# Patient Record
Sex: Female | Born: 1940 | Race: White | Hispanic: No | Marital: Married | Smoking: Never smoker
Health system: Southern US, Community
[De-identification: ages and names within clinical notes are randomized; demographics above are authoritative.]

## PROBLEM LIST (undated history)

## (undated) DIAGNOSIS — I10 Essential (primary) hypertension: Secondary | ICD-10-CM

## (undated) DIAGNOSIS — G35 Multiple sclerosis: Secondary | ICD-10-CM

## (undated) HISTORY — PX: CHOLECYSTECTOMY: SHX55

## (undated) HISTORY — DX: Essential (primary) hypertension: I10

## (undated) HISTORY — PX: ABDOMINAL HYSTERECTOMY: SHX81

## (undated) HISTORY — PX: OTHER SURGICAL HISTORY: SHX169

## (undated) HISTORY — PX: APPENDECTOMY: SHX54

## (undated) NOTE — Initial Assessments (Signed)
 Formatting of this note might be different from the original. Living Arrangements Lives at home? yes, spouse/significant other  Abuse questions Do you feel safe in your current living situation? yes Have  you been hit, hurt, or harmed by someone close to you? no Criteria suggestive of abuse: none  Functional Status Deficits related to this hospitalization: none  Nutritional Status Please list any identified nutritional problems: none  Continence Do you leak urine when you do not want to: Yes;satisfied with current treatment  Do you leak stool when you do not want to: Yes; satisfied with current treatment   Advance Directives Do you have an advance directive? yes: Agent: GARREL SHOVE, Contact phone number: [phone], Copy in chart: NO. and RN note patient has an advance directive in dear doctor section.   Electronically signed by Legrand Garre (R.N.) at 06/12/2008  1:35 PM PDT

## (undated) NOTE — Progress Notes (Signed)
 Formatting of this note might be different from the original. 1430 NOTED WITH MULTIPLE BRUISE  ON LEFT AND RIGHT ARM,ACCORDING TO PATIENT SHE HAD THAT WHENSHE HAD HER INFUSION LAST Tuesday, HAD ANOTHER AFTER IV INSERTION ,ICE PACK WAS APPLIED ON LEFT ARM,PATIENT COMFORTABLE,NO OTHER COMPLAINTS. Electronically signed by Altamirano, Susan N (R.N.) at 04/18/2008  3:10 PM PST

## (undated) NOTE — H&P (Signed)
 Formatting of this note might be different from the original. . PAIN MANAGEMENT  .  REFERRED BY  01/25/2008 16:09:00 Kelly Cole (Family Medicine) C - 43 y/o female with multiple sclerosis and problems with spasticity and intermittent burning pain in Cole arm and r leg requests referral to pain management; pleas eval  .  CHIEF COMPLAINT  Pain everywhere, lower back, butt shoulders  .  HISTORY OF THE PRESENT ILLNESS  Kelly Cole is a 27 year old female who reports since years and years and years pain everywhere, lower back, butt, shoulders. The sensations are burning, shooting, tremendous spasticity.  .  The pain ranges between 3/10 and 10/10.  .  The pt has had prolotherapy which did not change her symptoms. The pt has tried acupuncture which lightened it slightly.  .  The patient has not had any cervical or lumbar epidural steroid injections in the past.  .  MEDICATIONS (CURRENT)  .  PROVIGIL 200 MG  RESTASIS 0.05 % OPHT DROPPERETTE  CITALOPRAM 40 MG  ALENDRONATE  LISINOPRIL   AMLODIPINE   AVONEX  OXYBUTYNIN  OMEPRAZOLE  .  PAST PAIN MEDS D/C'D:  GABAPENTIN 300 MG (diarrhea), Tegretol (made gait bad), baclofen  (was on 20 years, can't remember its effects), Vicodin (used once or twice only)  .  ALLERGIES  Meperidine Hcl Asthma and/or Shortness of Breath  Tizanidine Hcl Nausea and/or Vomiting  .  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  PAST SURGICAL HISTORY  Appendectomy, cholecystectomy, oophorectomy, hysterectomy  .  SOCIAL HISTORY  +EtOH 2-3/night, Denies Tob/Rec Drugs  .  REVIEW OF SYSTEMS  +bladder or bowel incontinence x 20 years, denies melena  .  PHYSICAL EXAMINATION  BP 140/63   Pulse 65  Temp 95 F (35 C)  Resp 11  Ht 1.651 m (5' 5)  Wt 61.236 kg (135 lb)  SpO2 95%  LMP Hysterectomy, cervix removed . GENERAL  Pt is a well developed, well nourished in NAD  .  DIAGNOSTIC STUDIES  .  MRI OF THE LUMBAR SPINE WITHOUT CONTRAST: 03/21/08  CLINICAL HISTORY: Multiple sclerosis, low back pain.  TECHNIQUE: Axial and sagittal, T1 and T2 weighted images were  obtained.  COMPARISON: Correlation is made with CT scan of the abdomen and  pelvis from 05/10/05.  FINDINGS: Sagittal views show degenerative changes throughout the  lumbar spine. There is no evidence of spondylolisthesis. There is  a mild degree of motion artifact. The conus medullaris is  unremarkable although the distal spinal cord is not optimally  visualized.  The level of T12-L1 appears unremarkable.  At the level of L1-L2, there is decreased height of the disc space  with minimal broad-based posterior disc bulging. This is producing  minimal canal narrowing but no definite nerve root compression.  At L2-L3, there is moderate decrease in height of the disc space.  Again there is minimal broad-based posterior disc bulging and there  are minimal arthritic facet changes. Mild narrowing of the AP canal  diameter is seen. Neural foramina are not optimally defined due to  artifact, however mild neural foraminal narrowing may be associated  with the decreased height of the disc. No definite nerve root  compression is seen.  At L3-L4,  there is minimal right lateral disc bulging. No significant  posterior disc bulging is seen although there are relatively  prominent arthritic facet changes with facet hypertrophy and  thickening of the ligamentum flavum. These combined factors are  producing a moderate degree of canal narrowing with some crowding of  the nerve roots of the cauda equina. There may also be mild  bilateral neural foraminal narrowing.  At L4-L5, there is mild disc desiccation and arthritic facet  changes  are present. There is mild canal narrowing but no definite nerve  root compression.  At L5-S1, there is marked decrease in height of the disc space with a  small posterior central and right lateral osteophyte seen. Arthritic  facet changes are also present. Canal narrowing is minimal although  there appears to be a moderate degree of bilateral neural foraminal  narrowing, greater on the right side.  There are degenerative endplate signal changes at L5-S1. There is  also a relatively large Schmorl's node involving the left superior  endplate of L3. There are areas of signal abnormality surrounding  the Schmorl's node at L3 suggesting fatty content and this may  represent a surrounding hemangioma. Overall, the appearance is  benign and finding was present at the time of the patient's previous  abdominal CT scan performed April, 2007.  Incidental note is also made of a small fatty signal mass within the  lateral aspect of the mid portion of the right kidney consistent with  angiomyolipoma. This is also stable when compared to the CT scan  from April, 2007.  IMPRESSION:  1. Multilevel degenerative disc and arthritic facet changes.  There is moderate canal stenosis at L3-L4. Milder canal narrowing is  seen at additional levels and there is multilevel neural foraminal  narrowing. Detailed description is provided above.  2. 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 These incidental findings are stable when compared to a  prior CT scan of the abdomen performed 05/10/05.   . MR OF THE BRAIN 03/29/05  CLINICAL HISTORY: Diagnosis of multiple sclerosis has not had MR  for 20 years.  Routine images were obtained as well as sagittal FLAIR images. The  ventricles and sulci are prominent compatible with mild age-related  atrophy. Numerous high T2 signal foci are present in the  periventricular and high  white matter. These are more prominent in  the left frontal and parietal locations. There is a subcortical  focus in the left frontal lobe on series 4 image 16. The sagittal  FLAIR images show a pericallosal component with an appearance in  keeping the patient's known diagnosis of multiple sclerosis.  Region of sella and craniocervical junction are unremarkable.  IMPRESSION:  Mild age-related atrophy. Numerous small white matter lesions with  pericallosal component and in keeping with the patient's diagnosis of  multiple sclerosis. Microvascular ischemia is a second possibility.  There is a subcortical focus in the left frontal white matter.  .  WBC'S AUTO      7.1   08/24/2005 HGB            12.5   08/24/2005 HCT AUTO       38.2   08/24/2005 PLT'S AUTO      282   08/24/2005  K        4.2   06/15/2006 NA       126   06/15/2006 CL        90  06/15/2006 CO2       31   06/15/2006  CREAT      0.5   03/28/2007  PT      9.3   12/17/2002  APTT       27   08/24/2005  INR      1.0   08/24/2005  AST       19   05/10/2005  ALT       35   04/11/2007  ALKP       52   05/10/2005 .  ASSESSMENT  Kelly Cole is a 38 year old female with multiple sclerosis who reports since years and years and years pain everywhere, lower back, butt, shoulders. The sensations are burning, shooting, tremendous spasticity.  SABRA  PLAN  Lumbar epidural steroid inj with fluoro . electronically signed by Camellia KYM Cramp, M.D. Pain Management ##[phone] 06/12/2008 1:58 PM   Electronically signed by Cramp Camellia Balder (M.D.) at 06/12/2008  1:59 PM PDT

## (undated) NOTE — Procedures (Signed)
 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  The location of the procedure was in Ambulatory Surgery   The patient was positioned prone and monitored per medical center policy. The area was prepped with betadine and draped.  Multiple axis fluoroscopic guidance was used as necessary throughout the procedure.  Appropriate vertebral body(ies) was identified. Local anesthesia with 1% lidocaine was used as needed for patient comfort. An 18G Tuohy needle was carefully advanced into the epidural space vialoss of resistance technique at the corresponding level.  After aspiration showing negative for heme, cerebrospinal or body fluid, 1 ml of OMNIPAQUE  240 was injected through the needle and demonstrated a flow of contrast into epidural space without run off. Then 80 mg of Triamcinolone (Kenalog) and 6 ml of  0.125% lidocaine was injected into the epidural space.  The needle was then flushed with preservative-free normal saline, the stylet was replaced, and needle was removed, with the tip intact.    The patient's skin was cleansed and a sterile dressing was applied over the site.   Meticulous aseptic technique was used throughout the entire procedure.  The procedure was tolerated well   Total amount of local anesthetic used: 3 ml of 1% lidocaine  The patient was discharged per criteria.   Electronically  signed AVELINA MINERVA MD [facility] [phone] 04/18/2008     3:31 PM   Electronically signed by MINERVA AVELINA (M.D.) at 04/18/2008  3:32 PM PST

## (undated) NOTE — Procedures (Signed)
 Procedure(s): EPIDURAL STEROID INJECTION LUMBAR Pre-Procedure Diagnose(s): LUMBAR RADICULOPATHY Formatting of this note might be different from the original. DATE OF SERVICE 06/12/2008  PREPROCEDURE DIAGNOSIS Lumbar radiculopathy  POSTPROCEDURE DIAGNOSIS Lumbar radiculopathy  PROCEDURE 1. L3-4 epidural steroid injection 2. Intraoperative fluoroscopy  ANESTHESIA Conscious sedation  ESTIMATED BLOOD LOSS None  COMPLICATIONS None  DESCRIPTION OF PROCEDURE The patient was interviewed in the preoperative holding area.  The patient was informed that the risks include, but are not limited, to infection,bleeding, nerve damage, spinal cord injury, severe headache lasting one week or longer, seizures, heart attack, stroke, coma, paralysis, and death.  The pt was also informed that steroids may also cause an increase in blood glucose for up to 2 weeks and its use has been linked to avascular necrosis.  The pt was informed that this is an elective procedure and that alternatives include pain medication, physical therapy, and whatever recommendations made by other physicians.  The pt was informed that if the procedure causes a decrease in pain that this will most likely be a temporary benefit.  The procedure may be repeated up to 3 times per year.  All questions were answered.  The patient was brought to the procedure room and positioned prone.  The lower back and sacral region were prepped and draped in normal sterile fashion.  The C-arm and fluoroscope were used to visualize the L3-4 interspace.  Lidocaine 1%, using a 25 gauge hypodermic needle was used to infiltrate the overlying skin. An 18 gauge, Touhy epidural needle was then advanced until there was a positive loss of resistance to air utilizing a glass syringe. There were no CSF or paresthesias produced.  Injection of 2 ml Omnipaque  contrast revealed appropriate epidural spread. Betamethasone 12 mg diluted with preservative-free bupivicaine  0.1% to a total volume of 10mL was injected.  There was mild pressure-like pain consistent with the mild increase in epidural pressure associated with the injection.  The patient tolerated the procedure well, and there were no complications.  electronically signed by Camellia KYM Cramp, M.D. Pain Management ##[phone] 06/12/2008 2:15 PM   Electronically signed by Cramp Camellia Balder (M.D.) at 06/12/2008  2:16 PM PDT

## (undated) NOTE — Discharge Summary (Signed)
 Formatting of this note might be different from the original. Patient discharged home with family member and with personal belongings. Went over discharge instructions with patient and written material given. Patient verbalized understanding. No signs or symptoms of distress. Vital signs stable, pain at comfortable level, tolerating fluids, dressing clean, dry and intact.  Electronically signed by Legrand Garre (R.N.) at 04/18/2008  4:39 PM PST

## (undated) NOTE — Discharge Summary (Signed)
 Formatting of this note might be different from the original. Date of Procedure: 06/12/2008 Date of Discharge: 06/12/2008 Diagnosis: Lumbar radiculopathy Condition: stable Complications: None  electronically signed by Camellia KYM Cramp, M.D. Pain Management ##[phone] 06/12/2008 2:16 PM   Electronically signed by Cramp Camellia Balder (M.D.) at 06/12/2008  2:16 PM PDT

## (undated) NOTE — Initial Assessments (Signed)
 Formatting of this note might be different from the original. Living Arrangements Lives at home? yes, spouse/significant other  Abuse questions Do you feel safe in your current living situation? yes Have  you been hit, hurt, or harmed by someone close to you? no Criteria suggestive of abuse: none  Functional Status Deficits related to this hospitalization: none  Nutritional Status Please list any identified nutritional problems: none  Continence Do you leak urine when you do not want to: No  Do you have an advance directive? yes: HOME   Electronically signed by Levora Lamar Ruth (R.N.) at 04/18/2008  2:16 PM PST

## (undated) NOTE — Discharge Summary (Signed)
 Formatting of this note might be different from the original. DISCHARGE SUMMARY FOR OUTPATIENT PROCEDURE  DATE OF ADMISSION:  04/18/2008  DATE OF DISCHARGE:  04/18/2008  PROCEDURE: Procedure(s) (LRB): LUMBAR EPIDURAL STEROID INJECTION (N/A)  ADMISSION DIAGNOSIS: LUMBAR RADICULOPATHY [724.4] (LUMBAR RADICULOPATHY)  (Principle Dx)  Patient Active Hospital Problem List:  * No active hospital problems. *  HOSPITAL COURSE: Kelly Cole was admitted for the above pain procedure.  Standard perioperative procedures were followed. The patient tolerated the procedure well and did well postoperatively. Prior to leaving the hospital her medications were reviewed and reconciled.  COMPLICATIONS: None  CONDITION ON DISCHARGE: Stable.  Kelly Cole was tolerating solids without nausea or vomiting, could void, and had adequate pain control.  CONDITION OF WOUND: Dressed; no signs of bleeding or wound problems  DISPOSITION: Home with self care  DISCHARGE INSTRUCTIONS: Given  DISCHARGE MEDICATIONS: Current Discharge Medication List  CONTINUE these medications which have NOT CHANGED LYRICA  75 MG ORAL CAP TAKE 1 CAPSULE ORALLY EVERY 12 HOURS FOR 7 DAYS, THEN TAKE 2 CAPSULES ORALLY EVERY 12 HOURS THEREAFTER  PROVIGIL 200 MG ORAL TAB TAKE 1 TABLET ORALLY EVERY MORNING  BACLOFEN  10 MG ORAL TAB 1 TAB PO TID PRN MUSCLE SPASMS  RESTASIS 0.05 % OPHT DROPPERETTE INSTILL 1 DROP IN EACH EYE EVERY 12 HOURS  TRIMETHOPRIM -SULFAMETHOXAZOLE  160-800 MG ORAL TAB 1 TAB PO BID FOR 5 DAYS  CITALOPRAM 40 MG ORAL TAB TAKE ONE & ONE-HALF TABLETS ORALLY DAILY  ALENDRONATE 70 MG ORAL TAB TAKE 1 TABLET ORALLY EVERY WEEK IN THE MORNING WITH 6 TO 8 OZ OF PLAIN WATER, AT LEAST 30 MINUTES BEFORE THE FIRST MED, FOOD OR DRINK OF THE DAY. KEEP UPRIGHT FOR 30 TO 60 MINS. (GENERIC FOR FOSAMAX)  TERBINAFINE-MICONAZOLE TINC 750 MG-2 % 30 ML TOP SOLN APPLY TO NAIL DAILY  LISINOPRIL  40 MG ORAL TAB TAKE 1 TABLET ORALLY  DAILY  GABAPENTIN 300 MG ORAL CAP TAKE 1 CAPSULE ORALLY EVERY NIGHT AT BEDTIME FOR 1 WEEK THEN TAKE 1 CAPSULE ORALLY 2 TIMES DAILY  BD LUER-LOK SYRINGE 3 ML 23 X 1 MISC SYRINGE USE AS DIRECTED  AMLODIPINE   2.5 MG ORAL TAB TAKE 1 TABLET ORALLY DAILY (TO REPLACE FELODIPINE)  EEMT 1.25-2.5 MG ORAL TAB TAKE ONE-HALF TABLET ORALLY DAILY  OXYBUTYNIN CHLORIDE  5 MG ORAL TAB TAKE 1 TABLET ORALLY 3 TIMES DAILY  OMEPRAZOLE 20 MG ORAL CPDR SR CAP 1 CAP PO BID  FOLLOW UP: Advised to call the pain clinic, [phone] with any problems or for an appointment.    May return for a repeat injection if pain is relieved and then returns  Electronically signed AVELINA MINERVA MD [facility]: [phone] 04/18/2008     3:32 PM  Electronically signed by MINERVA AVELINA (M.D.) at 04/18/2008  3:32 PM PST

## (undated) NOTE — H&P (Signed)
 Formatting of this note might be different from the original. INTERVAL H&P       I have reviewed the History and Physical examination performed previously.  There are no changes noted today.            I have also confirmed with the patient the procedure to be performed, indications, risks, benefits, alternatives, expected outcomes and potential complications which could occur.         All the patient's questions were answered.    Electronically signed by: AVELINA MINERVA MD Cathalene [phone] 04/18/2008 3:11 PM   Electronically signed by MINERVA AVELINA (M.D.) at 04/18/2008  3:11 PM PST

---

## 2017-02-08 DIAGNOSIS — R251 Tremor, unspecified: Secondary | ICD-10-CM | POA: Insufficient documentation

## 2018-06-20 DIAGNOSIS — R338 Other retention of urine: Secondary | ICD-10-CM | POA: Diagnosis not present

## 2018-07-14 DIAGNOSIS — G894 Chronic pain syndrome: Secondary | ICD-10-CM | POA: Diagnosis not present

## 2018-07-14 DIAGNOSIS — G35 Multiple sclerosis: Secondary | ICD-10-CM | POA: Diagnosis not present

## 2018-07-14 DIAGNOSIS — Z9359 Other cystostomy status: Secondary | ICD-10-CM | POA: Diagnosis not present

## 2018-07-18 ENCOUNTER — Encounter: Payer: Self-pay | Admitting: Neurology

## 2018-07-25 DIAGNOSIS — R338 Other retention of urine: Secondary | ICD-10-CM | POA: Diagnosis not present

## 2018-08-28 ENCOUNTER — Ambulatory Visit: Payer: Self-pay | Admitting: Neurology

## 2018-08-30 DIAGNOSIS — R338 Other retention of urine: Secondary | ICD-10-CM | POA: Diagnosis not present

## 2018-09-22 ENCOUNTER — Other Ambulatory Visit: Payer: Self-pay

## 2018-09-22 ENCOUNTER — Ambulatory Visit (INDEPENDENT_AMBULATORY_CARE_PROVIDER_SITE_OTHER): Payer: PPO | Admitting: Neurology

## 2018-09-22 VITALS — BP 101/58 | HR 75 | Temp 98.9°F

## 2018-09-22 DIAGNOSIS — G35 Multiple sclerosis: Secondary | ICD-10-CM | POA: Diagnosis not present

## 2018-09-22 DIAGNOSIS — R6889 Other general symptoms and signs: Secondary | ICD-10-CM

## 2018-09-22 NOTE — Progress Notes (Signed)
NEUROLOGY CONSULTATION NOTE  [name] MRN: [id number] DOB: 05/06/1940  Referring provider: [name], MD Primary care provider: [name], MD  Reason for consult:  MS  HISTORY OF PRESENT ILLNESS: Kelly Cole is a 78 year old white female with depression and chronic low back pain who presents to establish care for multiple sclerosis.  History supplemented by referring provider note.  She is accompanied by her husband who supplements history.  As a teenager, she was hospitalized for bilateral leg weakness.  They thought Polio but never had a diagnosis.  Symptoms resolved.  She was diagnosed with MS in 1970 while in [place] after experiencing numbness and tingling in her fingers.  She may have received visual evoked potentials but was primarily diagnosed based on clinical findings.  Over the next decade, she gradually had decline in gait, developing a foot drop which caused her to trip.  In the mid-late 70s, she participated in a trial where she was treated with chlorambucil and drop foot resolved.  High doses of vitamin injections (C, B1, B2, B12, etc)  Over the last 10-15 years, she has had a gradual decline in ambulation.  She progressed from cane to walker about 10 years ago.  She has been in a wheelchair for the past 6 years, following a hemicolectomy.  MRI of brain from 2019 showed chronic demyelination.  Current disease modifying therapy:  none Past disease modifying therapy:  Betaseron (for 1 year, stopped due to side effects and ineffective)  Current medications:  Venlafaxine ER 150mg ; Wellbutrin 150mg  twice daily; methadone 5mg  daily; temazepam 150mg  QHS, BuSpar 5mg  twice daily; Seroquel 100mg  QHS, mirtazapine 30mg  QHS, methenamine hippurate 1 GM twice daily; lisinopril; propranolol 50mg  twice daily, B12 injections, baclofen 10mg  twice daily, D3 5000 IU daily  Vision:  Some blurred vision.  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   Motor:  Lower extremity  weakness, spasticity.  Action/postural tremors.  Cannot write.  Difficult but able to hold utensils.   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  Now treated with low dose methadone and low dose morphine. Gait:  Wheelchair-bound Bowel/Bladder:  Neurogenic bladder with suprapubic catheter Fatigue:  yes Cognition:  word-finding difficulty Mood:  Depression.  On Effexor, Wellbutrin  Family history of MS:  Cousin's daughter    PAST MEDICAL HISTORY: Multiple sclerosis Chronic low back pain Depression  PAST SURGICAL HISTORY: hemicolectomy  MEDICATIONS: Outpatient Encounter Medications as of 09/22/2018  Medication Sig  . busPIRone (BUSPAR) 10 MG tablet Take 0.5 mg by mouth 3 (three) times daily.  . collagenase (SANTYL) ointment Apply 1 application topically daily.  Marland Kitchen gabapentin (NEURONTIN) 300 MG capsule Take 300 mg by mouth 2 (two) times daily.  Marland Kitchen morphine 20 MG/5ML solution Take 20 mg by mouth every morning. Pt taking 0.125 ml in AM  . baclofen (LIORESAL) 10 MG tablet Take 10 mg by mouth every 12 (twelve) hours.  Marland Kitchen buPROPion (ZYBAN) 150 MG 12 hr tablet Take 450 mg by mouth every morning.  . cyanocobalamin (,VITAMIN B-12,) 1000 MCG/ML injection Inject 1,000 mcg into the muscle 3 (three) times a week.  Marland Kitchen lisinopril (ZESTRIL) 40 MG tablet Take 40 mg by mouth daily.  . methadone (DOLOPHINE) 5 MG tablet Take 2.5 mg by mouth every morning.  . methenamine (HIPREX) 1 g tablet Take 2 g by mouth daily.  . mirtazapine (REMERON) 30 MG tablet Take 30 mg by mouth at bedtime.  Marland Kitchen  omeprazole (PRILOSEC) 20 MG capsule Take 20 mg by mouth daily.  . propranolol (INDERAL) 10 MG tablet Take 1.5 tablets by mouth daily.  . QUEtiapine (SEROQUEL) 100 MG tablet Take 100 mg by mouth at bedtime.  . temazepam (RESTORIL) 15 MG capsule Take 30 mg by mouth at bedtime.  . tretinoin (RETIN-A) 0.025 % cream APP EXT TO FACE QD IN THE EVE  . venlafaxine XR  (EFFEXOR-XR) 150 MG 24 hr capsule Take 300 mg by mouth every morning.   No facility-administered encounter medications on file as of 09/22/2018.     ALLERGIES: Not on File  FAMILY HISTORY: No family history on file.  SOCIAL HISTORY: Married  REVIEW OF SYSTEMS: Constitutional: No fevers, chills, or sweats, no generalized fatigue, change in appetite Eyes: No visual changes, double vision, eye pain Ear, nose and throat: No hearing loss, ear pain, nasal congestion, sore throat Cardiovascular: No chest pain, palpitations Respiratory:  No shortness of breath at rest or with exertion, wheezes GastrointestinaI: No nausea or vomiting Genitourinary:  incontinece Musculoskeletal:  Scoliosis, back pain Integumentary: pressure ulcers Neurological: as above Psychiatric: No depression, insomnia, anxiety Endocrine: No palpitations, fatigue, diaphoresis, mood swings, change in appetite, change in weight, increased thirst Hematologic/Lymphatic:  No purpura, petechiae. Allergic/Immunologic: no itchy/runny eyes, nasal congestion, recent allergic reactions, rashes  PHYSICAL EXAM: Blood pressure (!) 101/58, pulse 75, temperature 98.9 F (37.2 C), SpO2 95 %. General: No acute distress.  Patient appears well-groomed.   Head:  Normocephalic/atraumatic Eyes:  fundi examined but not visualized Neck: supple, no paraspinal tenderness, full range of motion Back: No paraspinal tenderness Heart: regular rate and rhythm Lungs: Clear to auscultation bilaterally. Vascular: No carotid bruits. Neurological Exam: Mental status: alert and oriented to person, place, and time, recent and remote memory intact, fund of knowledge intact, attention and concentration intact, speech fluent and not dysarthric, language intact. Cranial nerves: CN I: not tested CN II: pupils equal, round and reactive to light, visual fields intact CN III, IV, VI:  full range of motion, no nystagmus, no ptosis CN V: facial sensation  intact CN VII: upper and lower face symmetric CN VIII: hearing intact CN IX, X: gag intact, uvula midline CN XI: sternocleidomastoid and trapezius muscles intact CN XII: tongue midline Bulk & Tone: increased, no fasciculations. Motor:  4/5 upper extremities, 3/5 lower extremities.  Action and postural tremor in hands.  Head tremor. Sensation:  Temperature and vibration sensation reduced in lower extremities Deep Tendon Reflexes:  2+ throughout, toes equivocal Finger to nose testing:  Bilateral tremor Heel to shin:  Unable to assess Gait:  Wheelchair bound  IMPRESSION: Multiple sclerosis  PLAN: 1.  We will check vitamin D and B12 levels 2.  Follow up in 6 months.  Thank you for allowing me to take part in the care of this patient.  60 minutes spent face to face with patient, over 50% spent discussing management.  Shon Millet, DO  CC: Merlene Laughter, MD

## 2018-09-22 NOTE — Patient Instructions (Addendum)
1.  We will check vitamin D and B12 level 2.  Let us know which pads we need to prescribe 3.  Follow up in 6 months.  Your provider has requested that you have labwork completed today.   Please go to [facility] ([id number]) on the second floor of this building before leaving the office today. You do not need to check in. If you are not called within 15 minutes please check with the front desk.

## 2018-09-23 LAB — VITAMIN D 25 HYDROXY (VIT D DEFICIENCY, FRACTURES): Vit D, 25-Hydroxy: 23 ng/mL — ABNORMAL LOW (ref 30–100)

## 2018-09-23 LAB — VITAMIN B12: Vitamin B-12: >2000 pg/mL — ABNORMAL HIGH (ref 200–1100)

## 2018-09-24 ENCOUNTER — Encounter: Payer: Self-pay | Admitting: Neurology

## 2018-09-25 ENCOUNTER — Telehealth: Payer: Self-pay

## 2018-09-25 NOTE — Telephone Encounter (Signed)
1200 IU daily is a lot.  I would just take 4000 IU daily.  Repeat level prior to next visit

## 2018-09-25 NOTE — Telephone Encounter (Signed)
Pt's husband returned my call.  Pt has not been taking D3 for 3 months, until last week. She is now taking 12000 IU daily, splitting that in to 3 daily doses. He asked if that was too much. I advised him I would need to check with Dr. Tomi Likens, however, I would think 6000 IU daily would be sufficient.

## 2018-09-25 NOTE — Telephone Encounter (Signed)
Called LMOVM for Pt to return my call 

## 2018-09-25 NOTE — Telephone Encounter (Signed)
-----   Message from Pieter Partridge, DO sent at 09/25/2018  9:10 AM EDT ----- B12 level is fine. Vitamin D level is low.  Verify again if she is indeed taking D3 5000 IU daily.  If so, then I would increase dose to 7000 IU daily and repeat level prior to follow up.

## 2018-09-26 NOTE — Telephone Encounter (Signed)
Called and spoke with husband. I advised him of 4000 IU D# and to check labs prior to follow up appt. He also wanted to know, since insurance is no longer paying for B12 injections, what we can do to assist with that situation. Per Dr. Tomi Likens, take OTC B12 1036mcg daily. Pt's husband verbalized understanding.

## 2018-09-27 DIAGNOSIS — R338 Other retention of urine: Secondary | ICD-10-CM | POA: Diagnosis not present

## 2018-11-01 DIAGNOSIS — R338 Other retention of urine: Secondary | ICD-10-CM | POA: Diagnosis not present

## 2018-11-13 ENCOUNTER — Encounter: Payer: Self-pay | Admitting: *Deleted

## 2018-11-13 ENCOUNTER — Other Ambulatory Visit: Payer: PPO

## 2018-11-13 ENCOUNTER — Other Ambulatory Visit: Payer: Self-pay | Admitting: *Deleted

## 2018-11-13 ENCOUNTER — Other Ambulatory Visit: Payer: Self-pay

## 2018-11-13 DIAGNOSIS — R6889 Other general symptoms and signs: Secondary | ICD-10-CM

## 2018-11-13 DIAGNOSIS — G35 Multiple sclerosis: Secondary | ICD-10-CM

## 2018-11-13 NOTE — Progress Notes (Addendum)
Penny from lab called as patient arrived to have her labs redrawn : Vit B12 and Vit D as were to be drawn the week prior to next appt.  Dr. Tomi Likens wanted to see her in 6 months from 09/22/18 which would be February 2021. Her labs would then be the week prior to that appt.  Left message for Kieth Brightly in lab to please tell patient above. Our front desk will call patient in am and reschedule to Feb (unless there was a specific reason she had wanted to be seen sooner).   Will re-enter labs with Feb 2021 date on them.   Appt for 03/2019 made and husband aware to bring patient in for labs week prior.

## 2018-11-17 ENCOUNTER — Ambulatory Visit: Payer: PPO | Admitting: Neurology

## 2018-11-20 ENCOUNTER — Telehealth: Payer: PPO | Admitting: Neurology

## 2018-11-28 DIAGNOSIS — Z79899 Other long term (current) drug therapy: Secondary | ICD-10-CM | POA: Diagnosis not present

## 2018-11-28 DIAGNOSIS — I10 Essential (primary) hypertension: Secondary | ICD-10-CM | POA: Diagnosis not present

## 2018-11-28 DIAGNOSIS — F330 Major depressive disorder, recurrent, mild: Secondary | ICD-10-CM | POA: Diagnosis not present

## 2018-11-28 DIAGNOSIS — Z0000 Encounter for general adult medical examination without abnormal findings: Secondary | ICD-10-CM | POA: Diagnosis not present

## 2018-11-28 DIAGNOSIS — G35 Multiple sclerosis: Secondary | ICD-10-CM | POA: Diagnosis not present

## 2018-11-28 DIAGNOSIS — Z9359 Other cystostomy status: Secondary | ICD-10-CM | POA: Diagnosis not present

## 2018-11-28 DIAGNOSIS — Z23 Encounter for immunization: Secondary | ICD-10-CM | POA: Diagnosis not present

## 2018-11-29 DIAGNOSIS — D649 Anemia, unspecified: Secondary | ICD-10-CM | POA: Diagnosis not present

## 2018-12-05 DIAGNOSIS — R338 Other retention of urine: Secondary | ICD-10-CM | POA: Diagnosis not present

## 2019-01-09 DIAGNOSIS — R338 Other retention of urine: Secondary | ICD-10-CM | POA: Diagnosis not present

## 2019-02-13 DIAGNOSIS — R338 Other retention of urine: Secondary | ICD-10-CM | POA: Diagnosis not present

## 2019-03-15 NOTE — Progress Notes (Signed)
Due to the COVID-19 crisis, this virtual check-in visit was done via telephone from my office and it was initiated and consent given by this patient and or family.   Telephone (Audio) Visit The purpose of this telephone visit is to provide medical care while limiting exposure to the novel coronavirus.    Consent was obtained for telephone visit and initiated by pt/family:  Yes.   Answered questions that patient had about telehealth interaction:  Yes.   I discussed the limitations, risks, security and privacy concerns of performing an evaluation and management service by telephone. I also discussed with the patient that there may be a patient responsible charge related to this service. The patient expressed understanding and agreed to proceed.  Pt location: Home Physician Location: office Name of referring provider:  [name], MD I connected with [name] at patients initiation/request on 03/19/2019 at  2:30 PM EST by telephone and verified that I am speaking with the correct person using two identifiers.  Pt MRN:  [id number] Pt DOB:  11-19-1940   History of Present Illness:  Kelly Cole is a 79 year old white female with depression and chronic low back pain who follows up for multiple sclerosis.    UPDATE: 09/22/2018 labs:  B12 >2,000; vitamin D level 23.  She never verified her dose.  Today, she checked her bottle and she is taking 6000 IU daily but is unsure if she has been taking this dose since she has moved here.  She reports worsening scoliosis and was wondering about a brace.  HISTORY: As a teenager, she was hospitalized for bilateral leg weakness.  They thought Polio but never had a diagnosis.  Symptoms resolved.  She was diagnosed with MS in 1970 while in [place] after experiencing numbness and tingling in her fingers.  She may have received visual evoked potentials but was primarily diagnosed based on clinical findings.  Over the next decade, she gradually had decline  in gait, developing a foot drop which caused her to trip.  In the mid-late 70s, she participated in a trial where she was treated with chlorambucil and drop foot resolved.  High doses of vitamin injections (C, B1, B2, B12, etc)  Over the last 10-15 years, she has had a gradual decline in ambulation.  She progressed from cane to walker about 10 years ago.  She has been in a wheelchair for the past 6 years, following a hemicolectomy.  MRI of brain from 2019 showed chronic demyelination.  Current disease modifying therapy:  none Past disease modifying therapy:  Betaseron (for 1 year, stopped due to side effects and ineffective)  Current medications:  Venlafaxine ER 150mg ; Wellbutrin 150mg  twice daily; methadone 5mg  daily; temazepam 150mg  QHS, BuSpar 5mg  twice daily; Seroquel 100mg  QHS, mirtazapine 30mg  QHS, methenamine hippurate 1 GM twice daily; lisinopril; propranolol 50mg  twice daily, B12 injections, baclofen 10mg  twice daily, D3 5000 IU daily  Vision:  Some blurred vision.  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   Motor:  Lower extremity weakness, spasticity.  Action/postural tremors.  Cannot write.  Difficult but able to hold utensils.   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  Now treated with low dose methadone and low dose morphine. Gait:  Wheelchair-bound Bowel/Bladder:  Neurogenic bladder with suprapubic catheter Fatigue:  yes Cognition:  word-finding difficulty Mood:  Depression.  On Effexor, Wellbutrin  Family history of MS:  Cousin's daughter  Observations/Objective:   Height 5\' 4"  (1.626 m), weight 125 lb (56.7 kg).  Assessment and Plan:   Multiple sclerosis   1.  Continue D3 6000 IU daily.  Recheck vitamin D level in 6 months. 2.  Follow up in 6 months.  Need for in person visit now:  No.   Follow Up Instructions:    -I discussed the assessment and treatment plan with the patient. The patient  was provided an opportunity to ask questions and all were answered. The patient agreed with the plan and demonstrated an understanding of the instructions.   The patient was advised to call back or seek an in-person evaluation if the symptoms worsen or if the condition fails to improve as anticipated.    Total Time spent in visit with the patient was:  12 minutes   , DO

## 2019-03-19 ENCOUNTER — Encounter: Payer: Self-pay | Admitting: Neurology

## 2019-03-19 ENCOUNTER — Other Ambulatory Visit: Payer: Self-pay

## 2019-03-19 ENCOUNTER — Telehealth (INDEPENDENT_AMBULATORY_CARE_PROVIDER_SITE_OTHER): Payer: PPO | Admitting: Neurology

## 2019-03-19 VITALS — Ht 64.0 in | Wt 125.0 lb

## 2019-03-19 DIAGNOSIS — M5136 Other intervertebral disc degeneration, lumbar region: Secondary | ICD-10-CM | POA: Insufficient documentation

## 2019-03-19 DIAGNOSIS — M419 Scoliosis, unspecified: Secondary | ICD-10-CM | POA: Insufficient documentation

## 2019-03-19 DIAGNOSIS — G35 Multiple sclerosis: Secondary | ICD-10-CM | POA: Diagnosis not present

## 2019-03-20 ENCOUNTER — Other Ambulatory Visit: Payer: Self-pay

## 2019-03-20 DIAGNOSIS — R338 Other retention of urine: Secondary | ICD-10-CM | POA: Diagnosis not present

## 2019-03-20 DIAGNOSIS — M5136 Other intervertebral disc degeneration, lumbar region: Secondary | ICD-10-CM

## 2019-03-20 DIAGNOSIS — G35 Multiple sclerosis: Secondary | ICD-10-CM

## 2019-03-28 ENCOUNTER — Encounter: Payer: Self-pay | Admitting: Physical Medicine and Rehabilitation

## 2019-04-09 ENCOUNTER — Encounter: Payer: PPO | Admitting: Physical Medicine and Rehabilitation

## 2019-04-16 ENCOUNTER — Other Ambulatory Visit: Payer: Self-pay

## 2019-04-16 ENCOUNTER — Encounter: Payer: PPO | Attending: Physical Medicine and Rehabilitation | Admitting: Physical Medicine and Rehabilitation

## 2019-04-16 ENCOUNTER — Encounter: Payer: Self-pay | Admitting: Physical Medicine and Rehabilitation

## 2019-04-16 VITALS — BP 126/55 | HR 67 | Temp 97.5°F | Ht 65.5 in | Wt 134.0 lb

## 2019-04-16 DIAGNOSIS — R252 Cramp and spasm: Secondary | ICD-10-CM | POA: Diagnosis not present

## 2019-04-16 DIAGNOSIS — M24576 Contracture, unspecified foot: Secondary | ICD-10-CM | POA: Insufficient documentation

## 2019-04-16 DIAGNOSIS — M5136 Other intervertebral disc degeneration, lumbar region: Secondary | ICD-10-CM

## 2019-04-16 DIAGNOSIS — M24573 Contracture, unspecified ankle: Secondary | ICD-10-CM | POA: Insufficient documentation

## 2019-04-16 DIAGNOSIS — G35 Multiple sclerosis: Secondary | ICD-10-CM | POA: Diagnosis not present

## 2019-04-16 DIAGNOSIS — M4120 Other idiopathic scoliosis, site unspecified: Secondary | ICD-10-CM | POA: Diagnosis not present

## 2019-04-16 MED ORDER — DANTROLENE SODIUM 25 MG PO CAPS: 25.0000 mg | ORAL_CAPSULE | Freq: Every day | ORAL | 2 refills | Status: DC

## 2019-04-16 NOTE — Progress Notes (Signed)
Subjective:    Patient ID: Kelly Cole, female    DOB: May 01, 1940, 79 y.o.   MRN: [id number]  HPI   Pt is a 79 yr old female with hx of MS and spasticity as well as severe scoliosis and HTN here for spasticity evaluation.     Bladder spasms- has suprapubic- c/o bladder spasms Doctor's at Dr Dimas Millin office change suprapubic spasms.   Was given Myrbetriq beforehand with SPC changes- treats spasms well.  Completely off Morphine and 2 weeks, will be off methadone.  Pain has basically gone away.  Hasn't had sciatic pain for awhile.   RLE goes numb with using toilet/BSC- as soon as gets on [facility]- can't get rid of it, no matter what position. With numbness, comes pain.   On toilet for 2 hours plus- prone to diarrhea more now than constipation. Will go every time eats something.     Was on baclofen 4x/day- weans down on it- made her loopy.     Sleeps on back, but uses wedges and uses Prevalon boots.   Hx of pressure ulcers on sacrum- if not careful, breaks down immediately.    Severe difficulty keeps coming head up- wants to know curvature of spine.    Weighted utensils didn't work well with intention tremor.        Pain Inventory Average Pain 0 Pain Right Now 0 My pain is na  In the last 24 hours, has pain interfered with the following? General activity 0 Relation with others 0 Enjoyment of life 0 What TIME of day is your pain at its worst? na Sleep (in general) Good  Pain is worse with: na Pain improves with: na Relief from Meds: na  Mobility use a wheelchair needs help with transfers  Function not employed: date last employed . disabled: date disabled . I need assistance with the following:  feeding, dressing, bathing, toileting, meal prep, household duties and shopping  Neuro/Psych bladder control problems bowel control problems weakness numbness tremor trouble walking spasms  Prior Studies Any changes since last visit?  no  Physicians  involved in your care Any changes since last visit?  no   No family history on file. Social History   Socioeconomic History  . Marital status: Married    Spouse name: Not on file  . Number of children: Not on file  . Years of education: Not on file  . Highest education level: Not on file  Occupational History  . Occupation: retired  Tobacco Use  . Smoking status: Never Smoker  . Smokeless tobacco: Never Used  Substance and Sexual Activity  . Alcohol use: Never  . Drug use: Never  . Sexual activity: Not on file  Other Topics Concern  . Not on file  Social History Narrative   Right handed   One story home   No children   Social Determinants of Health   Financial Resource Strain:   . Difficulty of Paying Living Expenses: Not on file  Food Insecurity:   . Worried About Programme researcher, broadcasting/film/video in the Last Year: Not on file  . Ran Out of Food in the Last Year: Not on file  Transportation Needs:   . Lack of Transportation (Medical): Not on file  . Lack of Transportation (Non-Medical): Not on file  Physical Activity:   . Days of Exercise per Week: Not on file  . Minutes of Exercise per Session: Not on file  Stress:   . Feeling of Stress : Not  on file  Social Connections:   . Frequency of Communication with Friends and Family: Not on file  . Frequency of Social Gatherings with Friends and Family: Not on file  . Attends Religious Services: Not on file  . Active Member of Clubs or Organizations: Not on file  . Attends Archivist Meetings: Not on file  . Marital Status: Not on file   Past Surgical History:  Procedure Laterality Date  . APPENDECTOMY    . CHOLECYSTECTOMY    . HYSTERCTOMY     Past Medical History:  Diagnosis Date  . HTN (hypertension)    There were no vitals taken for this visit.  Opioid Risk Score:   Fall Risk Score:  `1  Depression screen PHQ 2/9  No flowsheet data found.   Review of Systems  Constitutional: Negative.   HENT:  Negative.   Eyes: Negative.   Respiratory: Negative.   Cardiovascular: Negative.   Gastrointestinal: Negative.   Endocrine: Negative.   Genitourinary: Positive for difficulty urinating.  Musculoskeletal: Negative.   Skin: Negative.   Allergic/Immunologic: Negative.   Neurological: Positive for tremors and weakness.  Hematological: Negative.   Psychiatric/Behavioral: Negative.   All other systems reviewed and are negative.      Objective:   Physical Exam  Awake, alert, appropriate, slowed responsiveness, slowed processing, accompanied by husband, NAD MAS of 2 in RUE at elbow, shoulder and wrist LUE MAS is 1+ in L elbow; 1 in wrist and shoulder RLE- MAS 2- contracture forming in knees and ankles LLE- MAS of 2 with forming contracture of knee and ankle-   ~80-85 degrees of ankle DF B/L   1-2+ feet edema- cool to touch and blue tinged "don't feel them" Severe scoliosis noted and head tilt to L, severely tilted.   Intention tremors noted when tried to reach out.      Assessment & Plan:   Patient is a 79 yr old with severe late stage MS- and tremors, spasticity and forming contractures-   1. Suggest plan for bowel program- after methadone wean- suggest doing ditigal stimulation and/or suppository.   2. Already has Prevalon boots. Will try PRAFOs boots. But warned them to watch for achilles breakdown.    3.  Can wedge either inside or outside to keep from knee to foot wedged. TO prevent ankles/feet from turning.    4. Also suggest foam abduction wedge/splint.   5. Dantrolene 25 mg nightly- x 1 week then 50 mg nightly x 1 week then 50 mg 2x/day- will wait for talking with pt/husband before increasing higher.  Will talk about how she's doing and then can continue to increase to a max of 200 mg/day.   6. Side effects of Dantrolene- loose stools; increased liver functions in rare cases- and very rarely, sedation.   7. Start Dantrolene once off Methadone- suggestion.  8.  Once on it, will see if can come off Baclofen.  9. Will look into xrays for spine   10..  F/U in 4-6 weeks- will do CMP at that time.   I spent a total of 1 hour on appointment; more than 30 minutes on discussing plan as detailed above.

## 2019-04-16 NOTE — Patient Instructions (Addendum)
1. Suggest plan for bowel program- after methadone wean- suggest doing ditigal stimulation and/or suppository.   2. Already has Prevalon boots. Will try PRAFOs boots. But warned them to watch for achilles breakdown.    3.  Can wedge either inside or outside to keep from knee to foot wedged. TO prevent ankles/feet from turning.    4. Also suggest foam abduction wedge/splint.   5. Dantrolene 25 mg nightly- x 1 week then 50 mg nightly x 1 week then 50 mg 2x/day- will wait for talking with pt/husband before increasing higher.  Will talk about how she's doing and then can continue to increase to a max of 200 mg/day.   6. Side effects of Dantrolene- loose stools; increased liver functions in rare cases- and very rarely, sedation.   7. Start Dantrolene once off Methadone- suggestion.  8. Once on it, will see if can come off Baclofen.   9. Will look into xrays needed to measure curvature of spine. Will get ordered at next visit.  10..  F/U in 4-6 weeks- will do CMP at that time.

## 2019-04-24 DIAGNOSIS — R338 Other retention of urine: Secondary | ICD-10-CM | POA: Diagnosis not present

## 2019-05-29 DIAGNOSIS — R338 Other retention of urine: Secondary | ICD-10-CM | POA: Diagnosis not present

## 2019-05-31 DIAGNOSIS — M21371 Foot drop, right foot: Secondary | ICD-10-CM | POA: Diagnosis not present

## 2019-05-31 DIAGNOSIS — M21372 Foot drop, left foot: Secondary | ICD-10-CM | POA: Diagnosis not present

## 2019-06-01 DIAGNOSIS — K591 Functional diarrhea: Secondary | ICD-10-CM | POA: Diagnosis not present

## 2019-06-01 DIAGNOSIS — I10 Essential (primary) hypertension: Secondary | ICD-10-CM | POA: Diagnosis not present

## 2019-06-01 DIAGNOSIS — Z79899 Other long term (current) drug therapy: Secondary | ICD-10-CM | POA: Diagnosis not present

## 2019-06-01 DIAGNOSIS — J300 Vasomotor rhinitis: Secondary | ICD-10-CM | POA: Diagnosis not present

## 2019-06-01 DIAGNOSIS — G35 Multiple sclerosis: Secondary | ICD-10-CM | POA: Diagnosis not present

## 2019-06-18 ENCOUNTER — Encounter: Payer: Self-pay | Admitting: Physical Medicine and Rehabilitation

## 2019-06-18 ENCOUNTER — Other Ambulatory Visit: Payer: Self-pay

## 2019-06-18 ENCOUNTER — Encounter: Payer: PPO | Attending: Physical Medicine and Rehabilitation | Admitting: Physical Medicine and Rehabilitation

## 2019-06-18 VITALS — BP 154/81 | HR 77 | Temp 97.3°F | Ht 65.0 in | Wt 134.0 lb

## 2019-06-18 DIAGNOSIS — M4120 Other idiopathic scoliosis, site unspecified: Secondary | ICD-10-CM | POA: Insufficient documentation

## 2019-06-18 DIAGNOSIS — M24573 Contracture, unspecified ankle: Secondary | ICD-10-CM | POA: Diagnosis not present

## 2019-06-18 DIAGNOSIS — R252 Cramp and spasm: Secondary | ICD-10-CM | POA: Insufficient documentation

## 2019-06-18 DIAGNOSIS — M24576 Contracture, unspecified foot: Secondary | ICD-10-CM | POA: Diagnosis not present

## 2019-06-18 DIAGNOSIS — G35 Multiple sclerosis: Secondary | ICD-10-CM | POA: Insufficient documentation

## 2019-06-18 DIAGNOSIS — M5136 Other intervertebral disc degeneration, lumbar region: Secondary | ICD-10-CM | POA: Diagnosis not present

## 2019-06-18 MED ORDER — BACLOFEN 10 MG PO TABS: 10.0000 mg | ORAL_TABLET | Freq: Two times a day (BID) | ORAL | 11 refills | Status: DC

## 2019-06-18 NOTE — Patient Instructions (Addendum)
Patient is a 79 yr old with severe late stage MS- and tremors, spasticity and forming contractures-in knees and elbows esp    1. Bowel program for SCI patients (BP)  1. Use Dulcolax suppositories 2. Start with doing bowel program within 1 hour after a meal- breakfast or dinner 3. Take bowels meds -Po/oral at opposite time doing bowel program- if BP (bowel program) in evening, give PO meds in AM; and vice versa. Wait on this   4. Start with digital stimulation- up to 2nd knuckle- stimulate rectum x 30 seconds 5. Place suppository- wait 10 minutes 6. Do Dig stim/Digital stimulation) every 10-15 minutes until empty 7. If gets severely constipated, >3 days, can use Magnesium Citrate and follow in 4 hours with suppository or any type of enema.  8. Takes 6 weeks or so to get bowel to have a habit.    2. Baclofen increase to 2x/day for 1-2 weeks then 10 mg 3x/day- can also help diarrhea- makes her a little constipated.  So, lunch, supper and bedtime.  TO help reduce formation of contractures AND treat spasticity.  3.  Discussed trying to see what's going on with scoliosis- and what the plan to do with that information. Prolonged discussion.   4. F/U in 8 weeks.   5. Referral to Dr Mindi Slicker

## 2019-06-18 NOTE — Progress Notes (Signed)
Subjective:    Patient ID: Kelly Cole, female    DOB: 11/29/1940, 79 y.o.   MRN: [id number]  HPI    Patient is a 79 yr old with severe late stage MS- and tremors, spasticity and forming contractures- here for f/u.   Daily diarrhea and midday accidents.  Took her off Dantrolene because of loose stools.   Had been off Methadone for a little while,  a couple weeks- before trying Dantrolene.     Did compression hose and PRAFOs- added hose since LE edema esp ankle edema- PROBST stockings- was measured for them- to knees. Was measured for them and wearing during day.  Been using PRAFOs wit kickstands- has liked those  Wedge that elevates her feet at night- Swelling has gone down dramatically.   Bowels- is regular- first meal around noon- within the hour, on the toilet- sometimes on the toilet for 3 hrs. Within the last 1-2 weeks, having accidents- 3x this week and 2-3x/last week.   Today didn't eat anything because concerned about having accident.  Runs in cycles- usually regular Last bout of diarrhea was last April 2020.  Until this year- loose stools 2 months ago-  Starts with formed stool, then has bouts of diarrhea.  Gives her imodium. Makes her stool hard.  Not a sure thing.  Tried fiber- more loose stools.   Is lactose and gluten intolerant.   Takes Baclofen 10 mg 1x/day now On Temezepam 30 mg QHS for sleep.   Has discussed things bothering her.          Pain Inventory Average Pain 0 Pain Right Now 0 My pain is no pain  In the last 24 hours, has pain interfered with the following? General activity no pain Relation with others no pain Enjoyment of life no pain What TIME of day is your pain at its worst? no pain Sleep (in general) Fair  Pain is worse with: sometimes catheter spasms bothers her but otherwise no pain Pain improves with: no pain Relief from Meds: no pain  Mobility use a wheelchair needs help with transfers  Function disabled: date  disabled . I need assistance with the following:  feeding, dressing, bathing, toileting, meal prep, household duties and shopping  Neuro/Psych bladder control problems bowel control problems weakness tremor tingling trouble walking spasms depression  Prior Studies Any changes since last visit?  no  Physicians involved in your care Any changes since last visit?  no   No family history on file. Social History   Socioeconomic History  . Marital status: Married    Spouse name: Not on file  . Number of children: Not on file  . Years of education: Not on file  . Highest education level: Not on file  Occupational History  . Occupation: retired  Tobacco Use  . Smoking status: Never Smoker  . Smokeless tobacco: Never Used  Substance and Sexual Activity  . Alcohol use: Never  . Drug use: Never  . Sexual activity: Not on file  Other Topics Concern  . Not on file  Social History Narrative   Right handed   One story home   No children   Social Determinants of Health   Financial Resource Strain:   . Difficulty of Paying Living Expenses:   Food Insecurity:   . Worried About Programme researcher, broadcasting/film/video in the Last Year:   . Barista in the Last Year:   Transportation Needs:   . Freight forwarder (Medical):   Marland Kitchen  Lack of Transportation (Non-Medical):   Physical Activity:   . Days of Exercise per Week:   . Minutes of Exercise per Session:   Stress:   . Feeling of Stress :   Social Connections:   . Frequency of Communication with Friends and Family:   . Frequency of Social Gatherings with Friends and Family:   . Attends Religious Services:   . Active Member of Clubs or Organizations:   . Attends Archivist Meetings:   Marland Kitchen Marital Status:    Past Surgical History:  Procedure Laterality Date  . APPENDECTOMY    . CHOLECYSTECTOMY    . HYSTERCTOMY     Past Medical History:  Diagnosis Date  . HTN (hypertension)    BP (!) 154/81   Pulse 77   Temp (!)  97.3 F (36.3 C)   Ht 5\' 5"  (1.651 m) Comment: last reported  Wt 134 lb (60.8 kg) Comment: last reported  SpO2 95%   BMI 22.30 kg/m   Opioid Risk Score:   Fall Risk Score:  `1  Depression screen PHQ 2/9  Depression screen PHQ 2/9 06/18/2019  Decreased Interest 1  Down, Depressed, Hopeless 1  PHQ - 2 Score 2    Review of Systems  Constitutional: Negative.   HENT: Negative.   Eyes: Negative.   Respiratory: Negative.   Cardiovascular: Positive for leg swelling.  Gastrointestinal:       Bowel control  Endocrine: Negative.   Genitourinary:       Suprapubic catheter  Musculoskeletal:       Spasms  Skin: Negative.   Allergic/Immunologic: Negative.   Neurological: Positive for tremors and weakness.  Hematological: Negative.   Psychiatric/Behavioral: Positive for dysphoric mood.  All other systems reviewed and are negative.      Objective:   Physical Exam Awake, alert, slumped to R - scoliotic, kyphotic, accompanied by husband and caregiver, in transport w/c, NAD Knees R>L and elbow R>L contracture formation  MAS: MAS of 1+ to 2 in UEs MAS of 2 in knees/hips Tilts to R-  Appears to take shallower breaths No resp distress        Assessment & Plan:    Patient is a 79 yr old with severe late stage MS- and tremors, spasticity and forming contractures-in knees and elbows esp    1. Bowel program for SCI patients (BP)  1. Use Dulcolax suppositories 2. Start with doing bowel program within 1 hour after a meal- breakfast or dinner 3. Take bowels meds -Po/oral at opposite time doing bowel program- if BP (bowel program) in evening, give PO meds in AM; and vice versa. Wait on this   4. Start with digital stimulation- up to 2nd knuckle- stimulate rectum x 30 seconds 5. Place suppository- wait 10 minutes 6. Do Dig stim/Digital stimulation) every 10-15 minutes until empty 7. If gets severely constipated, >3 days, can use Magnesium Citrate and follow in 4 hours with  suppository or any type of enema.  8. Takes 6 weeks or so to get bowel to have a habit.    2. Baclofen increase to 2x/day for 1-2 weeks then 10 mg 3x/day- can also help diarrhea- makes her a little constipated.  So, lunch, supper and bedtime.  TO help reduce formation of contractures AND treat spasticity.  3.  Discussed trying to see what's going on with scoliosis- and what the plan to do with that information. Prolonged discussion.   4. F/U in 8 weeks.   5. Referral to Dr  Mindi Slicker I spent a total of 50 minutes on appointment- going over bowel program and discussing scoliosis.

## 2019-07-04 DIAGNOSIS — R338 Other retention of urine: Secondary | ICD-10-CM | POA: Diagnosis not present

## 2019-07-10 DIAGNOSIS — F4321 Adjustment disorder with depressed mood: Secondary | ICD-10-CM | POA: Diagnosis not present

## 2019-07-10 DIAGNOSIS — F329 Major depressive disorder, single episode, unspecified: Secondary | ICD-10-CM | POA: Diagnosis not present

## 2019-07-13 ENCOUNTER — Emergency Department (HOSPITAL_COMMUNITY)
Admission: EM | Admit: 2019-07-13 | Discharge: 2019-07-13 | Disposition: A | Discharge status: Home / Self Care | Payer: PPO | Attending: Emergency Medicine | Admitting: Emergency Medicine

## 2019-07-13 ENCOUNTER — Emergency Department (HOSPITAL_COMMUNITY): Payer: PPO

## 2019-07-13 DIAGNOSIS — Z79899 Other long term (current) drug therapy: Secondary | ICD-10-CM | POA: Insufficient documentation

## 2019-07-13 DIAGNOSIS — R197 Diarrhea, unspecified: Secondary | ICD-10-CM | POA: Diagnosis not present

## 2019-07-13 DIAGNOSIS — R531 Weakness: Secondary | ICD-10-CM | POA: Diagnosis not present

## 2019-07-13 DIAGNOSIS — R55 Syncope and collapse: Secondary | ICD-10-CM | POA: Diagnosis not present

## 2019-07-13 DIAGNOSIS — R231 Pallor: Secondary | ICD-10-CM | POA: Diagnosis not present

## 2019-07-13 DIAGNOSIS — I10 Essential (primary) hypertension: Secondary | ICD-10-CM | POA: Diagnosis not present

## 2019-07-13 DIAGNOSIS — R404 Transient alteration of awareness: Secondary | ICD-10-CM | POA: Diagnosis not present

## 2019-07-13 DIAGNOSIS — N3000 Acute cystitis without hematuria: Secondary | ICD-10-CM | POA: Insufficient documentation

## 2019-07-13 DIAGNOSIS — R110 Nausea: Secondary | ICD-10-CM | POA: Diagnosis not present

## 2019-07-13 DIAGNOSIS — E860 Dehydration: Secondary | ICD-10-CM | POA: Diagnosis not present

## 2019-07-13 DIAGNOSIS — E1165 Type 2 diabetes mellitus with hyperglycemia: Secondary | ICD-10-CM | POA: Diagnosis not present

## 2019-07-13 DIAGNOSIS — R4020 Unspecified coma: Secondary | ICD-10-CM | POA: Diagnosis not present

## 2019-07-13 LAB — PROTIME-INR
INR: 0.9 (ref 0.8–1.2)
Prothrombin Time: 11.7 seconds (ref 11.4–15.2)

## 2019-07-13 LAB — CBC WITH DIFFERENTIAL/PLATELET
Abs Immature Granulocytes: 0.09 10*3/uL — ABNORMAL HIGH (ref 0.00–0.07)
Basophils Absolute: 0.1 10*3/uL (ref 0.0–0.1)
Basophils Relative: 0 %
Eosinophils Absolute: 0.3 10*3/uL (ref 0.0–0.5)
Eosinophils Relative: 2 %
HCT: 46.9 % — ABNORMAL HIGH (ref 36.0–46.0)
Hemoglobin: 14.1 g/dL (ref 12.0–15.0)
Immature Granulocytes: 1 %
Lymphocytes Relative: 7 %
Lymphs Abs: 1.2 10*3/uL (ref 0.7–4.0)
MCH: 28.6 pg (ref 26.0–34.0)
MCHC: 30.1 g/dL (ref 30.0–36.0)
MCV: 95.1 fL (ref 80.0–100.0)
Monocytes Absolute: 0.9 10*3/uL (ref 0.1–1.0)
Monocytes Relative: 5 %
Neutro Abs: 14.3 10*3/uL — ABNORMAL HIGH (ref 1.7–7.7)
Neutrophils Relative %: 85 %
Platelets: 253 10*3/uL (ref 150–400)
RBC: 4.93 MIL/uL (ref 3.87–5.11)
RDW: 13.3 % (ref 11.5–15.5)
WBC: 16.8 10*3/uL — ABNORMAL HIGH (ref 4.0–10.5)
nRBC: 0 % (ref 0.0–0.2)

## 2019-07-13 LAB — URINALYSIS, ROUTINE W REFLEX MICROSCOPIC
Bilirubin Urine: NEGATIVE
Glucose, UA: NEGATIVE mg/dL
Hgb urine dipstick: NEGATIVE
Ketones, ur: 5 mg/dL — AB
Nitrite: NEGATIVE
Protein, ur: 100 mg/dL — AB
Specific Gravity, Urine: 1.018 (ref 1.005–1.030)
pH: 7 (ref 5.0–8.0)

## 2019-07-13 LAB — COMPREHENSIVE METABOLIC PANEL
ALT: 20 U/L (ref 0–44)
AST: 35 U/L (ref 15–41)
Albumin: 3.4 g/dL — ABNORMAL LOW (ref 3.5–5.0)
Alkaline Phosphatase: 74 U/L (ref 38–126)
Anion gap: 15 (ref 5–15)
BUN: 23 mg/dL (ref 8–23)
CO2: 23 mmol/L (ref 22–32)
Calcium: 8.9 mg/dL (ref 8.9–10.3)
Chloride: 101 mmol/L (ref 98–111)
Creatinine, Ser: 1.16 mg/dL — ABNORMAL HIGH (ref 0.44–1.00)
GFR calc Af Amer: 52 mL/min — ABNORMAL LOW (ref >60)
GFR calc non Af Amer: 45 mL/min — ABNORMAL LOW (ref >60)
Glucose, Bld: 153 mg/dL — ABNORMAL HIGH (ref 70–99)
Potassium: 4.4 mmol/L (ref 3.5–5.1)
Sodium: 139 mmol/L (ref 135–145)
Total Bilirubin: 0.6 mg/dL (ref 0.3–1.2)
Total Protein: 6 g/dL — ABNORMAL LOW (ref 6.5–8.1)

## 2019-07-13 LAB — CBG MONITORING, ED: Glucose-Capillary: 209 mg/dL — ABNORMAL HIGH (ref 70–99)

## 2019-07-13 LAB — ABO/RH: ABO/RH(D): A POS

## 2019-07-13 LAB — TYPE AND SCREEN
ABO/RH(D): A POS
Antibody Screen: NEGATIVE

## 2019-07-13 MED ORDER — SODIUM CHLORIDE 0.9 % IV BOLUS
1000.0000 mL | Freq: Once | INTRAVENOUS | Status: AC
  Administered 2019-07-13: 1000 mL via INTRAVENOUS

## 2019-07-13 MED ORDER — SODIUM CHLORIDE 0.9 % IV SOLN: INTRAVENOUS | Status: DC

## 2019-07-13 MED ORDER — LOPERAMIDE HCL 2 MG PO CAPS: 2.0000 mg | ORAL_CAPSULE | Freq: Once | ORAL | Status: DC

## 2019-07-13 MED ORDER — CEPHALEXIN 500 MG PO CAPS
500.0000 mg | ORAL_CAPSULE | Freq: Three times a day (TID) | ORAL | 0 refills | Status: DC
Start: 2019-07-13 — End: 2020-09-21

## 2019-07-13 NOTE — ED Notes (Signed)
EDP aware of pt rectal temp, warm blankets provided.

## 2019-07-13 NOTE — ED Provider Notes (Signed)
[facility] EMERGENCY DEPARTMENT Provider Note   CSN: [id number] Arrival date & time: 07/13/19  1636     History Chief Complaint  Patient presents with  . Near Syncope  . Diarrhea    Kelly Cole is a 79 y.o. female.  HPI   Patient presented to the emergency room for evaluation of a syncopal episode.  Patient states she has a history of multiple sclerosis as well as intestinal issues.  Patient states she has had diarrhea ongoing for the last 6 weeks.  Patient states she was on the commode when she started having another diarrhea episode.  She also became very nauseated.  Patient had an episode of loss of consciousness and was lowered to the floor by family.  On EMS arrival they noted to be hypotensive lethargic.  Patient is now alert and awake.  No seizure activity noted.  Patient is denying any trouble with chest pain or abdominal pain.  No fevers or chills.  Past Medical History:  Diagnosis Date  . HTN (hypertension)     Patient Active Problem List   Diagnosis Date Noted  . 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 Spasticity 04/16/2019  . Contracture of ankle and foot joint, unspecified laterality 04/16/2019  . Degenerative disc disease, lumbar 03/19/2019  . Multiple sclerosis ([place]) 03/19/2019  . Scoliosis 03/19/2019    Past Surgical History:  Procedure Laterality Date  . APPENDECTOMY    . CHOLECYSTECTOMY    . HYSTERCTOMY       OB History   No obstetric history on file.     No family history on file.  Social History   Tobacco Use  . Smoking status: Never Smoker  . Smokeless tobacco: Never Used  Substance Use Topics  . Alcohol use: Never  . Drug use: Never    Home Medications Prior to Admission medications   Medication Sig Start Date End Date Taking? Authorizing Provider  baclofen (LIORESAL) 10 MG tablet Take 1 tablet (10 mg total) by mouth 2 (two) times daily. X 2 weeks then 3x/day- for spasticity 06/18/19   Lovorn,  Jinny Blossom, MD  buPROPion (ZYBAN) 150 MG 12 hr tablet Take 450 mg by mouth every morning. 09/13/18   [provider]  busPIRone (BUSPAR) 10 MG tablet Take 0.5 mg by mouth 3 (three) times daily.    [provider]  collagenase (SANTYL) ointment Apply 1 application topically daily.    [provider]  cyanocobalamin (,VITAMIN B-12,) 1000 MCG/ML injection Inject 1,000 mcg into the muscle 3 (three) times a week. 09/06/18   [provider]  gabapentin (NEURONTIN) 300 MG capsule Take 300 mg by mouth 2 (two) times daily.    [provider]  ipratropium (ATROVENT) 0.03 % nasal spray As directed 06/01/19   [provider]  lisinopril (ZESTRIL) 40 MG tablet Take 40 mg by mouth daily. 08/14/18   [provider]  mirtazapine (REMERON) 30 MG tablet Take 30 mg by mouth at bedtime. 09/13/18   [provider]  propranolol (INDERAL) 10 MG tablet Take 1.5 tablets by mouth daily. 09/13/18   [provider]  QUEtiapine (SEROQUEL) 100 MG tablet Take 100 mg by mouth at bedtime. 09/13/18   [provider]  temazepam (RESTORIL) 15 MG capsule Take 30 mg by mouth at bedtime. 09/21/18   [provider]  tretinoin (RETIN-A) 0.025 % cream APP EXT TO FACE QD IN THE EVE 09/04/18   [provider]  venlafaxine XR (EFFEXOR-XR) 150 MG  24 hr capsule Take 300 mg by mouth every morning. 09/13/18   [provider]    Allergies    Demerol  [meperidine]  Review of Systems   Review of Systems  All other systems reviewed and are negative.   Physical Exam Updated Vital Signs BP (!) 125/52   Pulse 68   Temp (!) 95.5 F (35.3 C) (Rectal)   Resp 12   Ht 1.6 m (5\' 3" )   Wt 56.2 kg   SpO2 100%   BMI 21.97 kg/m   Physical Exam Vitals and nursing note reviewed.  Constitutional:      General: She is not in acute distress.    Appearance: She is underweight.  HENT:     Head: Normocephalic and atraumatic.     Right Ear: External  ear normal.     Left Ear: External ear normal.  Eyes:     General: No scleral icterus.       Right eye: No discharge.        Left eye: No discharge.     Conjunctiva/sclera: Conjunctivae normal.  Neck:     Trachea: No tracheal deviation.  Cardiovascular:     Rate and Rhythm: Normal rate and regular rhythm.  Pulmonary:     Effort: Pulmonary effort is normal. No respiratory distress.     Breath sounds: Normal breath sounds. No stridor. No wheezing or rales.  Abdominal:     General: Bowel sounds are normal. There is no distension.     Palpations: Abdomen is soft.     Tenderness: There is no abdominal tenderness. There is no guarding or rebound.  Musculoskeletal:        General: No tenderness.     Cervical back: Neck supple.  Skin:    General: Skin is warm and dry.     Findings: No rash.  Neurological:     Mental Status: She is alert.     Cranial Nerves: No cranial nerve deficit (no facial droop, extraocular movements intact, no slurred speech).     Sensory: No sensory deficit.     Motor: Weakness and atrophy present. No seizure activity.     Comments: Generalized weakness, limited mobility of her arms and legs, no facial droop, normal speech     ED Results / Procedures / Treatments   Labs (all labs ordered are listed, but only abnormal results are displayed) Labs Reviewed  COMPREHENSIVE METABOLIC PANEL - Abnormal; Notable for the following components:      Result Value   Glucose, Bld 153 (*)    Creatinine, Ser 1.16 (*)    Total Protein 6.0 (*)    Albumin 3.4 (*)    GFR calc non Af Amer 45 (*)    GFR calc Af Amer 52 (*)    All other components within normal limits  CBC WITH DIFFERENTIAL/PLATELET - Abnormal; Notable for the following components:   WBC 16.8 (*)    HCT 46.9 (*)    Neutro Abs 14.3 (*)    Abs Immature Granulocytes 0.09 (*)    All other components within normal limits  URINALYSIS, ROUTINE W REFLEX MICROSCOPIC - Abnormal; Notable for the following components:     APPearance CLOUDY (*)    Ketones, ur 5 (*)    Protein, ur 100 (*)    Leukocytes,Ua LARGE (*)    Bacteria, UA MANY (*)    All other components within normal limits  CBG MONITORING, ED - Abnormal; Notable for the following components:  Glucose-Capillary 209 (*)    All other components within normal limits  URINE CULTURE  PROTIME-INR  CBC WITH DIFFERENTIAL/PLATELET  CBG MONITORING, ED  TYPE AND SCREEN  ABO/RH    EKG EKG Interpretation  Date/Time:  Friday July 13 2019 16:49:42 EDT Ventricular Rate:  68 PR Interval:    QRS Duration: 113 QT Interval:  481 QTC Calculation: 512 R Axis:   73 Text Interpretation: Sinus rhythm Short PR interval Biatrial enlargement Borderline intraventricular conduction delay Repol abnrm suggests ischemia, anterolateral Prolonged QT interval Artifact in lead(s) I II III aVR aVL aVF V1 V2 No old tracing to compare Confirmed by Linwood Dibbles [id number]) on 07/13/2019 4:54:36 PM   Radiology DG Chest Port 1 View  Result Date: 07/13/2019 CLINICAL DATA:  Weakness EXAM: PORTABLE CHEST 1 VIEW COMPARISON:  None. FINDINGS: There is hyperinflation of the lungs compatible with COPD. Biapical scarring. Lungs otherwise clear. No effusions. Heart is normal size. No acute bony abnormality. IMPRESSION: COPD/chronic changes.  No active disease. Electronically Signed   By: Charlett Nose M.D.   On: 07/13/2019 18:03    Procedures Procedures (including critical care time)  Medications Ordered in ED Medications  sodium chloride 0.9 % bolus 1,000 mL (0 mLs Intravenous Stopped 07/13/19 1805)    And  0.9 %  sodium chloride infusion ( Intravenous New Bag/Given 07/13/19 1816)  loperamide (IMODIUM) capsule 2 mg (has no administration in time range)    ED Course  I have reviewed the triage vital signs and the nursing notes.  Pertinent labs & imaging results that were available during my care of the patient were reviewed by me and considered in my medical decision making (see chart for  details).  Clinical Course as of Jul 12 1944  Fri Jul 13, 2019  1831 Chest x-ray without acute findings   [JK]  1831 Leukocytosis noted on CBC   [JK]  1834 Pt had another loose stool.  Family requests imodium    [JK]  1834 Note: C. difficile study canceled because the stool was formed.   [JK]  1927 Ua pending.  Pt has indwelling catheter but no urinary symptoms   [JK]  1930 Pt is feeling better after fluids.  Would like to go home   [JK]  1945 Urinalysis does show large leukocytes and many bacteria.  This could be colonized.  We will wait on urine culture.   [JK]    Clinical Course User Index [JK] Linwood Dibbles, MD   MDM Rules/Calculators/A&P                      Patient presented to ED for evaluation after a syncopal episode.  Patient has been having issues with diarrhea.  Suspect her symptoms were related to dehydration and vasovagal episode.  Patient's vital signs have stabilized.  EKG is reassuring.  No significant electrolyte abnormalities.  Leukocytosis noted but the patient's not showing any signs of severe infection.  Patient is feeling better and would like to go home.  I did send off a urine sample and this is pending.  .  Follow-up on the urine culture. Final Clinical Impression(s) / ED Diagnoses Final diagnoses:  Dehydration  Syncope, unspecified syncope type      Linwood Dibbles, MD 07/13/19 1949

## 2019-07-13 NOTE — ED Notes (Signed)
This nurse received a call from microbiology for rejected stool sample r/t it being formed instead of liquid.

## 2019-07-13 NOTE — ED Notes (Signed)
All discharge instructions reviewed with pt and spouse. Both denied questions and understood all instructions. Pt discharged without issue.

## 2019-07-13 NOTE — Discharge Instructions (Addendum)
We were not able to process the clostridium difficile test because the stool sample was solid.  We sent off a urine culture today.  Make sure to drink plenty of fluids.  Follow-up with your doctor to be rechecked next week.  Return as needed for worsening symptoms

## 2019-07-13 NOTE — ED Triage Notes (Signed)
Pt to ED via EMS from home, c/o on going diarrhea over the past 6 weeks. Witnessed syncopal episode by family this afternoon while pt was on toilet- No fall, pt was lowered floor by family. On EMS arrival pt was lethargic, more alert now. Pt A&O now. Answers questions appropriately. 4mg  zofran and 500 ML bolus  given by EMS #[id number]. HX MS, Suprapubic cath in place- pt reports has had for years and has home health, reports cath last changed two weeks ago. Last VS: 110 PALP,. HR 64, RR 16, 96%RA, 303 CBG,.

## 2019-07-15 LAB — URINE CULTURE

## 2019-07-16 DIAGNOSIS — R197 Diarrhea, unspecified: Secondary | ICD-10-CM | POA: Diagnosis not present

## 2019-07-23 DIAGNOSIS — R739 Hyperglycemia, unspecified: Secondary | ICD-10-CM | POA: Diagnosis not present

## 2019-08-01 DIAGNOSIS — F4321 Adjustment disorder with depressed mood: Secondary | ICD-10-CM | POA: Diagnosis not present

## 2019-08-01 DIAGNOSIS — F329 Major depressive disorder, single episode, unspecified: Secondary | ICD-10-CM | POA: Diagnosis not present

## 2019-08-08 DIAGNOSIS — R338 Other retention of urine: Secondary | ICD-10-CM | POA: Diagnosis not present

## 2019-08-27 ENCOUNTER — Encounter: Payer: PPO | Attending: Physical Medicine and Rehabilitation | Admitting: Physical Medicine and Rehabilitation

## 2019-08-27 ENCOUNTER — Other Ambulatory Visit: Payer: Self-pay

## 2019-08-27 ENCOUNTER — Encounter: Payer: Self-pay | Admitting: Physical Medicine and Rehabilitation

## 2019-08-27 VITALS — Ht 63.0 in | Wt 124.0 lb

## 2019-08-27 DIAGNOSIS — M5136 Other intervertebral disc degeneration, lumbar region: Secondary | ICD-10-CM | POA: Insufficient documentation

## 2019-08-27 DIAGNOSIS — M4120 Other idiopathic scoliosis, site unspecified: Secondary | ICD-10-CM

## 2019-08-27 DIAGNOSIS — M24576 Contracture, unspecified foot: Secondary | ICD-10-CM | POA: Insufficient documentation

## 2019-08-27 DIAGNOSIS — M24573 Contracture, unspecified ankle: Secondary | ICD-10-CM | POA: Insufficient documentation

## 2019-08-27 DIAGNOSIS — M4140 Neuromuscular scoliosis, site unspecified: Secondary | ICD-10-CM | POA: Diagnosis not present

## 2019-08-27 DIAGNOSIS — G35 Multiple sclerosis: Secondary | ICD-10-CM | POA: Diagnosis not present

## 2019-08-27 DIAGNOSIS — R252 Cramp and spasm: Secondary | ICD-10-CM | POA: Diagnosis not present

## 2019-08-27 NOTE — Patient Instructions (Signed)
Patient is a 79 yr old with severe late stage MS- and tremors, spasticity and forming contractures-in knees and elbows esp   here for f/u via MyChart. Didn't work- so changed to phone call in last 1/2.    1. Can stat Quetiapine at 50 mg nightly for 1 week, then go up to 100 mg nightly- for tremors.  2. Not doing bowel program- per pt request.   3. Discussed at length - form fitted back power w/c. For positioning  Out of bed, and lets her out of 1 location.  Have pt/husband discuss and decide if she wants it ordered- .  4. H/H PT and OT evaluation and treatment for feeding and for trunk control. Order placed.    5. F/U in 3 months.   6. Discussed psychiatry- and get referral by PCP to Psychiatry; get in with Dr Gibson Ramp.

## 2019-08-27 NOTE — Progress Notes (Signed)
Subjective:    Patient ID: Kelly Cole, female    DOB: June 03, 1940, 79 y.o.   MRN: [id number]  HPI   Due to national recommendations of social distancing because of COVID 21, an audio/video tele-health visit is felt to be the most appropriate encounter for this patient at this time. See MyChart message from today for the patient's consent to a tele-health encounter with [facility]. This is a follow up tele-visit via Mychart. The patient is at home. MD is at office.    Patient is a 79 yr old with severe late stage MS- and tremors, spasticity and forming contractures-in knees and elbows esp   here for f/u via MyChart. Didn't work- so changed to phone call.   Didn't start the bowel program.  Doesn't like it.  Put on BRAT diet,  Probiotics, and fiber Some success from that- firmed up stools.    Hand tremors- worsened- not able to feed herself now.  Quetiapine- by Psychiatrist- for hand tremors.  Ranay wants off meds as much as possible- so stopped it- stopped 2 months ago, and tremors are worse.  Still on Propranolol 15 mg 2x/day.   Stopped Quetiapine.    Having difficulty holding self up in w/c- trunk control is getting very difficult.  Wants OT and PT for feeding self and trunk control.   Wearing boots at night- R foot is responding well-pretty straight most of the day;  L foot has not improved- using pressure "socks" but the L ankle is still causing issues. Got compression hose fitted by [employer]- edema is doing much better- almost nothing for awhile; but swelling is coming back a little in L ankle.         Having caregiver issues as well.   Has a transport w/c, but not a specialized manual/electric w/c.   R ribs on the arm of the w/c- has to put foam rubber 3 inches thick/wedge to cover to area- or really painful.    Was put in palliative care at [facility]- 2.5 years ago.  Doesn't have motor control- due to tremors to control it.    Still has tremors in head/chin as well, so that wouldn't be an easy way to move around.    Has gone downhill in last 2 months and is more depressed as a result.  Doesn't have psychiatrist involved here-   Was doing well with Wellbutrin, Effexor- 300 mg, Remeron 30 mg      Pain Inventory Average Pain 0 Pain Right Now 0 My pain is sharp and stabbing  In the last 24 hours, has pain interfered with the following? General activity 0 Relation with others 0 Enjoyment of life 0 What TIME of day is your pain at its worst? No set time.  Sleep (in general) Good to Fair.  Pain is worse with: Rare pain. Pain improves with: medication and Positional change. Relief from Meds: 2  Mobility ability to climb steps?  no do you drive?  no use a wheelchair needs help with transfers  Function disabled: date disabled about 20 years.  I need assistance with the following:  feeding, dressing, bathing, toileting, meal prep, household duties and shopping Do you have any goals in this area?  yes  Neuro/Psych bladder control problems bowel control problems weakness numbness tremor tingling trouble walking spasms dizziness confusion depression anxiety suicidal thoughts  Prior Studies Any changes since last visit?  no  Physicians involved in your care Any changes since last  visit?  no   History reviewed. No pertinent family history. Social History   Socioeconomic History  . Marital status: Married    Spouse name: Not on file  . Number of children: Not on file  . Years of education: Not on file  . Highest education level: Not on file  Occupational History  . Occupation: retired  Tobacco Use  . Smoking status: Never Smoker  . Smokeless tobacco: Never Used  Vaping Use  . Vaping Use: Never used  Substance and Sexual Activity  . Alcohol use: Never  . Drug use: Never  . Sexual activity: Not on file  Other Topics Concern  . Not on file  Social History Narrative   Right  handed   One story home   No children   Social Determinants of Health   Financial Resource Strain:   . Difficulty of Paying Living Expenses:   Food Insecurity:   . Worried About Programme researcher, broadcasting/film/video in the Last Year:   . Barista in the Last Year:   Transportation Needs:   . Freight forwarder (Medical):   Marland Kitchen Lack of Transportation (Non-Medical):   Physical Activity:   . Days of Exercise per Week:   . Minutes of Exercise per Session:   Stress:   . Feeling of Stress :   Social Connections:   . Frequency of Communication with Friends and Family:   . Frequency of Social Gatherings with Friends and Family:   . Attends Religious Services:   . Active Member of Clubs or Organizations:   . Attends Banker Meetings:   Marland Kitchen Marital Status:    Past Surgical History:  Procedure Laterality Date  . APPENDECTOMY    . CHOLECYSTECTOMY    . HYSTERCTOMY     Past Medical History:  Diagnosis Date  . HTN (hypertension)    Ht 5\' 3"  (1.6 m)   Wt 124 lb (56.2 kg)   BMI 21.97 kg/m   Opioid Risk Score:   Fall Risk Score:  `1  Depression screen PHQ 2/9  Depression screen [facility] 2/9 08/27/2019 06/18/2019  Decreased Interest 0 1  Down, Depressed, Hopeless 0 1  PHQ - 2 Score 0 2  Altered sleeping 0 -  PHQ-9 Score 0 -   Review of Systems  Gastrointestinal: Positive for diarrhea.  Genitourinary:       Bladder spasms after foley change  Neurological: Positive for dizziness, tremors and numbness.  Psychiatric/Behavioral: Positive for confusion and suicidal ideas.       Anxiety, Depression       Objective:   Physical Exam Seen in webex first half of interview       Assessment & Plan:  Patient is a 79 yr old with severe late stage MS- and tremors, spasticity and forming contractures-in knees and elbows esp   here for f/u via MyChart. Didn't work- so changed to phone call in last 1/2.    1. Can stat Quetiapine at 50 mg nightly for 1 week, then go up to 100 mg  nightly- for tremors.  2. Not doing bowel program- per pt request.   3. Discussed at length - form fitted back power w/c. For positioning  Out of bed, and lets her out of 1 location.  Have pt/husband discuss and decide if she wants it ordered- .  4. H/H PT and OT evaluation and treatment for feeding and for trunk control. Order placed.    5. F/U in 3 months.  6. Discussed psychiatry- and get referral by PCP to Psychiatry; get in with Dr Gibson Ramp.    I spent a total of 45 minutes on visit- detailed as above.

## 2019-08-29 DIAGNOSIS — M6281 Muscle weakness (generalized): Secondary | ICD-10-CM | POA: Diagnosis not present

## 2019-08-29 DIAGNOSIS — R252 Cramp and spasm: Secondary | ICD-10-CM | POA: Diagnosis not present

## 2019-08-29 DIAGNOSIS — R262 Difficulty in walking, not elsewhere classified: Secondary | ICD-10-CM | POA: Diagnosis not present

## 2019-08-29 DIAGNOSIS — F329 Major depressive disorder, single episode, unspecified: Secondary | ICD-10-CM | POA: Diagnosis not present

## 2019-08-29 DIAGNOSIS — I10 Essential (primary) hypertension: Secondary | ICD-10-CM | POA: Diagnosis not present

## 2019-08-29 DIAGNOSIS — F419 Anxiety disorder, unspecified: Secondary | ICD-10-CM | POA: Diagnosis not present

## 2019-08-29 DIAGNOSIS — M4140 Neuromuscular scoliosis, site unspecified: Secondary | ICD-10-CM | POA: Diagnosis not present

## 2019-08-29 DIAGNOSIS — G35 Multiple sclerosis: Secondary | ICD-10-CM | POA: Diagnosis not present

## 2019-08-30 ENCOUNTER — Telehealth: Payer: Self-pay

## 2019-08-30 NOTE — Telephone Encounter (Signed)
Pete with [facility] called:   Kelly Cole is in need of a wheelchair. She currently using a transport chair.   Please advise.  Thank you.

## 2019-08-30 NOTE — Telephone Encounter (Signed)
Spoke with Cindee Lame at [employer]- pt really needs either a power w/c, or a molded back manual high back w/c with tilt in space- manual vs power so can do pressure relief.   Collene Leyden I agree and that I reinforced it for a long time on last visit.

## 2019-08-30 NOTE — Telephone Encounter (Signed)
Pete with [facility] given an verbal okay. For PT twice a week for 3 weeks. Then once a week for 3 weeks.    Call back ph# [phone].

## 2019-09-03 DIAGNOSIS — F419 Anxiety disorder, unspecified: Secondary | ICD-10-CM | POA: Diagnosis not present

## 2019-09-03 DIAGNOSIS — M6281 Muscle weakness (generalized): Secondary | ICD-10-CM | POA: Diagnosis not present

## 2019-09-03 DIAGNOSIS — R262 Difficulty in walking, not elsewhere classified: Secondary | ICD-10-CM

## 2019-09-03 DIAGNOSIS — R252 Cramp and spasm: Secondary | ICD-10-CM | POA: Diagnosis not present

## 2019-09-03 DIAGNOSIS — I10 Essential (primary) hypertension: Secondary | ICD-10-CM | POA: Diagnosis not present

## 2019-09-03 DIAGNOSIS — F329 Major depressive disorder, single episode, unspecified: Secondary | ICD-10-CM | POA: Diagnosis not present

## 2019-09-03 DIAGNOSIS — M4140 Neuromuscular scoliosis, site unspecified: Secondary | ICD-10-CM | POA: Diagnosis not present

## 2019-09-03 DIAGNOSIS — G35 Multiple sclerosis: Secondary | ICD-10-CM | POA: Diagnosis not present

## 2019-09-11 DIAGNOSIS — R262 Difficulty in walking, not elsewhere classified: Secondary | ICD-10-CM | POA: Diagnosis not present

## 2019-09-11 DIAGNOSIS — M4140 Neuromuscular scoliosis, site unspecified: Secondary | ICD-10-CM | POA: Diagnosis not present

## 2019-09-11 DIAGNOSIS — I10 Essential (primary) hypertension: Secondary | ICD-10-CM | POA: Diagnosis not present

## 2019-09-11 DIAGNOSIS — F419 Anxiety disorder, unspecified: Secondary | ICD-10-CM | POA: Diagnosis not present

## 2019-09-11 DIAGNOSIS — R252 Cramp and spasm: Secondary | ICD-10-CM | POA: Diagnosis not present

## 2019-09-11 DIAGNOSIS — F329 Major depressive disorder, single episode, unspecified: Secondary | ICD-10-CM | POA: Diagnosis not present

## 2019-09-11 DIAGNOSIS — M6281 Muscle weakness (generalized): Secondary | ICD-10-CM | POA: Diagnosis not present

## 2019-09-11 DIAGNOSIS — G35 Multiple sclerosis: Secondary | ICD-10-CM | POA: Diagnosis not present

## 2019-09-12 DIAGNOSIS — R338 Other retention of urine: Secondary | ICD-10-CM | POA: Diagnosis not present

## 2019-09-17 ENCOUNTER — Ambulatory Visit: Payer: PPO | Admitting: Neurology

## 2019-09-18 DIAGNOSIS — F4321 Adjustment disorder with depressed mood: Secondary | ICD-10-CM | POA: Diagnosis not present

## 2019-09-18 DIAGNOSIS — F329 Major depressive disorder, single episode, unspecified: Secondary | ICD-10-CM | POA: Diagnosis not present

## 2019-09-25 ENCOUNTER — Ambulatory Visit: Payer: PPO | Admitting: Neurology

## 2019-10-10 ENCOUNTER — Other Ambulatory Visit: Payer: Self-pay | Admitting: Neurology

## 2019-10-10 MED ORDER — PROPRANOLOL HCL 10 MG PO TABS: 30.0000 mg | ORAL_TABLET | Freq: Two times a day (BID) | ORAL | 5 refills | Status: DC

## 2019-10-11 DIAGNOSIS — G35 Multiple sclerosis: Secondary | ICD-10-CM | POA: Diagnosis not present

## 2019-10-11 DIAGNOSIS — R262 Difficulty in walking, not elsewhere classified: Secondary | ICD-10-CM | POA: Diagnosis not present

## 2019-10-11 DIAGNOSIS — F419 Anxiety disorder, unspecified: Secondary | ICD-10-CM | POA: Diagnosis not present

## 2019-10-11 DIAGNOSIS — F329 Major depressive disorder, single episode, unspecified: Secondary | ICD-10-CM | POA: Diagnosis not present

## 2019-10-11 DIAGNOSIS — M6281 Muscle weakness (generalized): Secondary | ICD-10-CM | POA: Diagnosis not present

## 2019-10-11 DIAGNOSIS — M4140 Neuromuscular scoliosis, site unspecified: Secondary | ICD-10-CM | POA: Diagnosis not present

## 2019-10-11 DIAGNOSIS — R252 Cramp and spasm: Secondary | ICD-10-CM | POA: Diagnosis not present

## 2019-10-11 DIAGNOSIS — I10 Essential (primary) hypertension: Secondary | ICD-10-CM | POA: Diagnosis not present

## 2019-10-23 DIAGNOSIS — R338 Other retention of urine: Secondary | ICD-10-CM | POA: Diagnosis not present

## 2019-11-26 DIAGNOSIS — F329 Major depressive disorder, single episode, unspecified: Secondary | ICD-10-CM | POA: Diagnosis not present

## 2019-11-26 DIAGNOSIS — F4321 Adjustment disorder with depressed mood: Secondary | ICD-10-CM | POA: Diagnosis not present

## 2019-11-27 DIAGNOSIS — R338 Other retention of urine: Secondary | ICD-10-CM | POA: Diagnosis not present

## 2019-12-05 DIAGNOSIS — H6123 Impacted cerumen, bilateral: Secondary | ICD-10-CM | POA: Diagnosis not present

## 2019-12-05 DIAGNOSIS — Z1389 Encounter for screening for other disorder: Secondary | ICD-10-CM | POA: Diagnosis not present

## 2019-12-05 DIAGNOSIS — F330 Major depressive disorder, recurrent, mild: Secondary | ICD-10-CM | POA: Diagnosis not present

## 2019-12-05 DIAGNOSIS — I10 Essential (primary) hypertension: Secondary | ICD-10-CM | POA: Diagnosis not present

## 2019-12-05 DIAGNOSIS — G35 Multiple sclerosis: Secondary | ICD-10-CM | POA: Diagnosis not present

## 2019-12-05 DIAGNOSIS — G250 Essential tremor: Secondary | ICD-10-CM | POA: Diagnosis not present

## 2019-12-05 DIAGNOSIS — Z0000 Encounter for general adult medical examination without abnormal findings: Secondary | ICD-10-CM | POA: Diagnosis not present

## 2019-12-05 DIAGNOSIS — Z9359 Other cystostomy status: Secondary | ICD-10-CM | POA: Diagnosis not present

## 2019-12-05 DIAGNOSIS — Z23 Encounter for immunization: Secondary | ICD-10-CM | POA: Diagnosis not present

## 2019-12-05 DIAGNOSIS — L989 Disorder of the skin and subcutaneous tissue, unspecified: Secondary | ICD-10-CM | POA: Diagnosis not present

## 2019-12-05 DIAGNOSIS — Z79899 Other long term (current) drug therapy: Secondary | ICD-10-CM | POA: Diagnosis not present

## 2019-12-07 DIAGNOSIS — H6123 Impacted cerumen, bilateral: Secondary | ICD-10-CM | POA: Diagnosis not present

## 2019-12-12 NOTE — Progress Notes (Signed)
NEUROLOGY FOLLOW UP OFFICE NOTE  Kelly Cole [id number]  HISTORY OF PRESENT ILLNESS: Kelly Cole is a 79 year old white female with multiple sclerosis, depression and chronic low back pain who follows up for tremor.  She is accompanied by her husband who supplements history.  UPDATE: Current medications:Venlafaxine ER 150mg ; Wellbutrin 150mg  twice daily; methadone 5mg  daily; temazepam 150mg  QHS, BuSpar 5mg  twice daily; Seroquel 100mg  QHS, mirtazapine 30mg  QHS, mlisinopril; propranolol ER 80mg  daily, gabapentin 200mg  BID, B12 injections, baclofen 10mg  twice daily, D3 5000 IU daily  She started experiencing tremor about 4 or 5 years ago while still in .  In included her chin and hands.  It would occur with action and not at rest.  She tried carbidopa-levodopa, which was ineffective.  It was determined not to be Parkinsonian.  She was approved for DBS but backed out at the end because symptoms seemed to improve and she was scared about potential side effects.  Over the summer, she endorsed worsening tremors in her head and chin affecting her ability to eat.  Propranolol was titrated up from 15mg  twice daily to 30mg  twice daily which was subsequently increased to ER 80mg  daily four days ago.  Gabapentin is being tapered down every 10 days by a 100mg .  No increase in neuralgia.    HISTORY: As a teenager, she was hospitalized for bilateral leg weakness. They thought Polio but never had a diagnosis. Symptoms resolved. She was diagnosed with MS in 1970while in after experiencing numbness and tingling in her fingers. She may have received visual evoked potentials but was primarily diagnosed based on clinical findings. Over the next decade, she gradually had decline in gait, developing a foot drop which caused her to trip. In the mid-late 70s, sheparticipated in a trial where she was treated with chlorambuciland drop foot resolved. High doses of vitamin injections (C, B1, B2,  B12, etc)  Over the last 10-15 years, she has had a gradual decline in ambulation. She progressed from cane to walker about 10 years ago. She has been in a wheelchair for the past 6 years, following a hemicolectomy.   MRI of brain from 2019 showed chronic demyelination.  Past disease modifying therapy: Betaseron (for 1 year, stopped due to side effects and ineffective)  Vision:Some blurred vision. 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  Motor: Lower extremity weakness, spasticity. Action/postural tremors. Cannot write. Difficult but able to hold utensils.  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 Now treated with low dose methadone and low dose morphine. Gait: Wheelchair-bound Bowel/Bladder: Neurogenic bladder with suprapubic catheter Fatigue: yes Cognition: word-finding difficulty Mood: Depression. On Effexor, Wellbutrin  Family history of MS: Cousin's daughter   PAST MEDICAL HISTORY: Past Medical History:  Diagnosis Date  . HTN (hypertension)     MEDICATIONS: Current Outpatient Medications on File Prior to Visit  Medication Sig Dispense Refill  . baclofen (LIORESAL) 10 MG tablet Take 1 tablet (10 mg total) by mouth 2 (two) times daily. X 2 weeks then 3x/day- for spasticity 90 each 11  . buPROPion (ZYBAN) 150 MG 12 hr tablet Take 450 mg by mouth every morning.    . busPIRone (BUSPAR) 10 MG tablet Take 0.5 mg by mouth 3 (three) times daily.    . cephALEXin (KEFLEX) 500 MG capsule Take 1 capsule (500 mg total) by mouth 3 (three) times daily. 21 capsule 0  . collagenase (SANTYL) ointment Apply 1 application topically daily.     cyanocobalamin (,VITAMIN B-12,) 1000 MCG/ML injection Inject 1,000 mcg into the muscle 3 (three) times a week.    . gabapentin (NEURONTIN) 300 MG capsule Take 300 mg by mouth 2 (two) times daily.    Marland Kitchen ipratropium (ATROVENT) 0.03 % nasal spray As directed    .  lisinopril (ZESTRIL) 40 MG tablet Take 40 mg by mouth daily.    . mirtazapine (REMERON) 30 MG tablet Take 30 mg by mouth at bedtime.    . propranolol (INDERAL) 10 MG tablet Take 3 tablets (30 mg total) by mouth 2 (two) times daily. 180 tablet 5  . QUEtiapine (SEROQUEL) 100 MG tablet Take 100 mg by mouth at bedtime.    . temazepam (RESTORIL) 15 MG capsule Take 30 mg by mouth at bedtime.    . tretinoin (RETIN-A) 0.025 % cream APP EXT TO FACE QD IN THE EVE    . venlafaxine XR (EFFEXOR-XR) 150 MG 24 hr capsule Take 300 mg by mouth every morning.     No current facility-administered medications on file prior to visit.    ALLERGIES: Allergies  Allergen Reactions  . Demerol  [Meperidine] Other (See Comments)    FAMILY HISTORY: No family history on file.  SOCIAL HISTORY: Social History   Socioeconomic History  . Marital status: Married    Spouse name: Not on file  . Number of children: Not on file  . Years of education: Not on file  . Highest education level: Not on file  Occupational History  . Occupation: retired  Tobacco Use  . Smoking status: Never Smoker  . Smokeless tobacco: Never Used  Vaping Use  . Vaping Use: Never used  Substance and Sexual Activity  . Alcohol use: Never  . Drug use: Never  . Sexual activity: Not on file  Other Topics Concern  . Not on file  Social History Narrative   Right handed   One story home   No children   Social Determinants of Health   Financial Resource Strain:   . Difficulty of Paying Living Expenses: Not on file  Food Insecurity:   . Worried About Programme researcher, broadcasting/film/video in the Last Year: Not on file  . Ran Out of Food in the Last Year: Not on file  Transportation Needs:   . Lack of Transportation (Medical): Not on file  . Lack of Transportation (Non-Medical): Not on file  Physical Activity:   . Days of Exercise per Week: Not on file  . Minutes of Exercise per Session: Not on file  Stress:   . Feeling of Stress : Not on file    Social Connections:   . Frequency of Communication with Friends and Family: Not on file  . Frequency of Social Gatherings with Friends and Family: Not on file  . Attends Religious Services: Not on file  . Active Member of Clubs or Organizations: Not on file  . Attends Banker Meetings: Not on file  . Marital Status: Not on file  Intimate Partner Violence:   . Fear of Current or Ex-Partner: Not on file  . Emotionally Abused: Not on file  . Physically Abused: Not on file  . Sexually Abused: Not on file    PHYSICAL EXAM: Blood pressure (!) 167/87, pulse 74, height 5\' 5"  (1.651 m), SpO2 99 %. General: No acute distress.  Patient appears well-groomed.   Head:  Normocephalic/atraumatic Eyes:  Fundi examined but not visualized Neck: supple, no paraspinal tenderness, full range of motion Heart:  Regular rate  and rhythm Lungs:  Clear to auscultation bilaterally Back: No paraspinal tenderness Neurological Exam: alert and oriented to person, place, and time. Attention span and concentration intact, recent and remote memory intact, fund of knowledge intact.  Speech fluent and not dysarthric, language intact.  CN II-XII intact. Decreased bulk and increased tone.  Motor strength 4/5 upper extremities, 2/5 lower extremities.  Tremor of head.  Finger to nose with bilateral ataxic tremor.  No tremor at rest.  DTR 2+ throughout.  Wheelchair-bound.  IMPRESSION: 1.  Multiple sclerosis 2.  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    Due to COVID, I have not physically evaluated Mrs. Champoux for over a year.  Now that I have seen her again, I can better characterize her tremor.  From my standpoint, I do not think DBS would be effective for her tremor.  She has a cerebellar tremor while leads for DBS involve the thalamus.  It appears her physicians in [place] performed modified DBS procedures with leads in atypical locations, but our movement disorder specialist is not familiar with that.   Unfortunately, cerebellar tremors are difficult to treat and typically do not respond to medications for essential tremor, such as propranolol, primidone or topiramate.  I would consider starting clonazepam but that would require stopping temazepam, which she is hesitant to do as she has been on temazepam for a long time, which has been helpful to her for sleep.  PLAN: 1.  At this time, I would continue propranolol as it still may be beneficial. 2.  Agree with tapering off of gabapentin if not needed, to minimize polypharmacy 3.  If later desired, I can always refer to a movement disorder clinic at one of the academic centers. 4.  Follow up 6 months.  Shon Millet, DO  CC: Merlene Laughter, MD

## 2019-12-13 ENCOUNTER — Encounter: Payer: Self-pay | Admitting: Neurology

## 2019-12-13 ENCOUNTER — Ambulatory Visit: Payer: PPO | Admitting: Neurology

## 2019-12-13 ENCOUNTER — Other Ambulatory Visit: Payer: Self-pay

## 2019-12-13 VITALS — BP 167/87 | HR 74 | Ht 65.0 in

## 2019-12-13 DIAGNOSIS — G35 Multiple sclerosis: Secondary | ICD-10-CM

## 2019-12-13 DIAGNOSIS — G252 Other specified forms of tremor: Secondary | ICD-10-CM | POA: Diagnosis not present

## 2019-12-13 NOTE — Patient Instructions (Signed)
I don't think DBS would be helpful for your tremors.  Ultimately, I can always send you to a movement disorder clinic at an academic center at any point.  In the meantime, I would continue working with the propranolol.  I would not switch temazepam to clonazepam due to possible complications  Follow up 6 months.

## 2019-12-31 DIAGNOSIS — N319 Neuromuscular dysfunction of bladder, unspecified: Secondary | ICD-10-CM | POA: Diagnosis not present

## 2019-12-31 DIAGNOSIS — R338 Other retention of urine: Secondary | ICD-10-CM | POA: Diagnosis not present

## 2019-12-31 DIAGNOSIS — G35 Multiple sclerosis: Secondary | ICD-10-CM | POA: Diagnosis not present

## 2020-02-05 DIAGNOSIS — N319 Neuromuscular dysfunction of bladder, unspecified: Secondary | ICD-10-CM | POA: Diagnosis not present

## 2020-03-12 DIAGNOSIS — N3000 Acute cystitis without hematuria: Secondary | ICD-10-CM | POA: Diagnosis not present

## 2020-03-12 DIAGNOSIS — N319 Neuromuscular dysfunction of bladder, unspecified: Secondary | ICD-10-CM | POA: Diagnosis not present

## 2020-03-12 DIAGNOSIS — R338 Other retention of urine: Secondary | ICD-10-CM | POA: Diagnosis not present

## 2020-03-12 DIAGNOSIS — R8271 Bacteriuria: Secondary | ICD-10-CM | POA: Diagnosis not present

## 2020-04-09 DIAGNOSIS — N200 Calculus of kidney: Secondary | ICD-10-CM | POA: Diagnosis not present

## 2020-04-09 DIAGNOSIS — M4186 Other forms of scoliosis, lumbar region: Secondary | ICD-10-CM | POA: Diagnosis not present

## 2020-04-09 DIAGNOSIS — M47816 Spondylosis without myelopathy or radiculopathy, lumbar region: Secondary | ICD-10-CM | POA: Diagnosis not present

## 2020-04-09 DIAGNOSIS — N3000 Acute cystitis without hematuria: Secondary | ICD-10-CM | POA: Diagnosis not present

## 2020-04-09 DIAGNOSIS — R109 Unspecified abdominal pain: Secondary | ICD-10-CM | POA: Diagnosis not present

## 2020-04-09 DIAGNOSIS — Z9049 Acquired absence of other specified parts of digestive tract: Secondary | ICD-10-CM | POA: Diagnosis not present

## 2020-04-09 DIAGNOSIS — R338 Other retention of urine: Secondary | ICD-10-CM | POA: Diagnosis not present

## 2020-05-05 DIAGNOSIS — K529 Noninfective gastroenteritis and colitis, unspecified: Secondary | ICD-10-CM | POA: Diagnosis not present

## 2020-05-05 DIAGNOSIS — Z9359 Other cystostomy status: Secondary | ICD-10-CM

## 2020-05-05 DIAGNOSIS — Z960 Presence of urogenital implants: Secondary | ICD-10-CM | POA: Diagnosis not present

## 2020-05-05 DIAGNOSIS — I10 Essential (primary) hypertension: Secondary | ICD-10-CM | POA: Diagnosis present

## 2020-05-05 DIAGNOSIS — G250 Essential tremor: Secondary | ICD-10-CM | POA: Diagnosis present

## 2020-05-05 DIAGNOSIS — G35 Multiple sclerosis: Secondary | ICD-10-CM | POA: Diagnosis not present

## 2020-05-05 DIAGNOSIS — F330 Major depressive disorder, recurrent, mild: Secondary | ICD-10-CM | POA: Diagnosis present

## 2020-05-14 DIAGNOSIS — R338 Other retention of urine: Secondary | ICD-10-CM | POA: Diagnosis not present

## 2020-05-30 ENCOUNTER — Emergency Department (HOSPITAL_COMMUNITY)
Admission: EM | Admit: 2020-05-30 | Discharge: 2020-05-30 | Disposition: A | Discharge status: Home / Self Care | Payer: PPO | Attending: Emergency Medicine | Admitting: Emergency Medicine

## 2020-05-30 ENCOUNTER — Encounter (HOSPITAL_COMMUNITY): Payer: Self-pay | Admitting: *Deleted

## 2020-05-30 ENCOUNTER — Other Ambulatory Visit: Payer: Self-pay

## 2020-05-30 DIAGNOSIS — Z79899 Other long term (current) drug therapy: Secondary | ICD-10-CM | POA: Diagnosis not present

## 2020-05-30 DIAGNOSIS — T83098A Other mechanical complication of other indwelling urethral catheter, initial encounter: Secondary | ICD-10-CM | POA: Diagnosis not present

## 2020-05-30 DIAGNOSIS — I10 Essential (primary) hypertension: Secondary | ICD-10-CM | POA: Diagnosis not present

## 2020-05-30 DIAGNOSIS — T83198A Other mechanical complication of other urinary devices and implants, initial encounter: Secondary | ICD-10-CM | POA: Diagnosis not present

## 2020-05-30 DIAGNOSIS — Y846 Urinary catheterization as the cause of abnormal reaction of the patient, or of later complication, without mention of misadventure at the time of the procedure: Secondary | ICD-10-CM | POA: Insufficient documentation

## 2020-05-30 DIAGNOSIS — Z9359 Other cystostomy status: Secondary | ICD-10-CM

## 2020-05-30 HISTORY — DX: Multiple sclerosis: G35

## 2020-05-30 LAB — URINALYSIS, ROUTINE W REFLEX MICROSCOPIC
Bacteria, UA: NONE SEEN
Bilirubin Urine: NEGATIVE
Glucose, UA: NEGATIVE mg/dL
Hgb urine dipstick: NEGATIVE
Ketones, ur: NEGATIVE mg/dL
Nitrite: POSITIVE — AB
Protein, ur: 30 mg/dL — AB
Specific Gravity, Urine: <=1.005 (ref 1.005–1.030)
pH: 8.5 — ABNORMAL HIGH (ref 5.0–8.0)

## 2020-05-30 NOTE — ED Triage Notes (Signed)
Supra pubic cath has drained little today, she has been passing urine via urethra, has wet pads today with urince. Husband tried to irrigate cath without results.

## 2020-05-30 NOTE — ED Provider Notes (Signed)
[facility]-EMERGENCY DEPT Provider Note   CSN: [id number] Arrival date & time: 05/30/20  1757     History No chief complaint on file.   Kelly Cole is a 80 y.o. female.  Presents to ER with concern for clogged suprapubic catheter.  States that she has had a suprapubic catheter for many years due to issues with urinary incontinence related to her underlying multiple sclerosis.  States that today her catheter has drained very little, concerned that it is clogged.  Has had some urine draining from her urethra.  Was not having any medical issues before this.  HPI     Past Medical History:  Diagnosis Date  . HTN (hypertension)   . Multiple sclerosis [facility])     Patient Active Problem List   Diagnosis Date Noted  . 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 Spasticity 04/16/2019  . Contracture of ankle and foot joint, unspecified laterality 04/16/2019  . Degenerative disc disease, lumbar 03/19/2019  . Multiple sclerosis (HCC) 03/19/2019  . Scoliosis 03/19/2019    Past Surgical History:  Procedure Laterality Date  . ABDOMINAL HYSTERECTOMY    . APPENDECTOMY    . CHOLECYSTECTOMY    . HYSTERCTOMY       OB History   No obstetric history on file.     No family history on file.  Social History   Tobacco Use  . Smoking status: Never Smoker  . Smokeless tobacco: Never Used  Vaping Use  . Vaping Use: Never used  Substance Use Topics  . Alcohol use: Yes  . Drug use: Never    Home Medications Prior to Admission medications   Medication Sig Start Date End Date Taking? Authorizing Provider  baclofen (LIORESAL) 10 MG tablet Take 1 tablet (10 mg total) by mouth 2 (two) times daily. X 2 weeks then 3x/day- for spasticity 06/18/19   Lovorn, Aundra Millet, MD  buPROPion (ZYBAN) 150 MG 12 hr tablet Take 450 mg by mouth every morning. 09/13/18   [provider]  busPIRone (BUSPAR) 10 MG tablet Take 0.5 mg by mouth 3 (three) times daily.     [provider]  cephALEXin (KEFLEX) 500 MG capsule Take 1 capsule (500 mg total) by mouth 3 (three) times daily. 07/13/19   Linwood Dibbles, MD  collagenase (SANTYL) ointment Apply 1 application topically daily.    [provider]  cyanocobalamin (,VITAMIN B-12,) 1000 MCG/ML injection Inject 1,000 mcg into the muscle 3 (three) times a week. 09/06/18   [provider]  gabapentin (NEURONTIN) 100 MG capsule Take 200 mg by mouth 2 (two) times daily. 12/05/19   [provider]  gabapentin (NEURONTIN) 300 MG capsule Take 300 mg by mouth 2 (two) times daily.    [provider]  ipratropium (ATROVENT) 0.03 % nasal spray As directed 06/01/19   [provider]  lisinopril (ZESTRIL) 40 MG tablet Take 40 mg by mouth daily. 08/14/18   [provider]  mirtazapine (REMERON) 30 MG tablet Take 30 mg by mouth at bedtime. 09/13/18   [provider]  propranolol (INDERAL) 10 MG tablet Take 3 tablets (30 mg total) by mouth 2 (two) times daily. Patient not taking: Reported on 12/13/2019 10/10/19   Drema Dallas, DO  propranolol ER (INDERAL LA) 80 MG 24 hr capsule Take 80 mg by mouth daily. 12/05/19   [provider]  QUEtiapine (SEROQUEL) 100 MG tablet Take 100 mg by mouth at bedtime. 09/13/18   [provider]  temazepam (RESTORIL)  15 MG capsule Take 30 mg by mouth at bedtime. 09/21/18   [provider]  tretinoin (RETIN-A) 0.025 % cream APP EXT TO FACE QD IN THE EVE 09/04/18   [provider]  venlafaxine XR (EFFEXOR-XR) 150 MG 24 hr capsule Take 300 mg by mouth every morning. 09/13/18   [provider]    Allergies    Demerol  [meperidine]  Review of Systems   Review of Systems  Constitutional: Negative for chills and fever.  HENT: Negative for ear pain and sore throat.   Eyes: Negative for pain and visual disturbance.  Respiratory: Negative for cough and shortness of breath.   Cardiovascular: Negative for chest  pain and palpitations.  Gastrointestinal: Negative for abdominal pain and vomiting.  Genitourinary: Negative for dysuria and hematuria.  Musculoskeletal: Negative for arthralgias and back pain.  Skin: Negative for color change and rash.  Neurological: Negative for seizures and syncope.  All other systems reviewed and are negative.   Physical Exam Updated Vital Signs BP (!) 144/107 (BP Location: Right Arm)   Pulse 66   Temp 98 F (36.7 C) (Oral)   Resp 15   Ht 5' 5.5" (1.664 m)   Wt 54 kg   SpO2 100%   BMI 19.50 kg/m   Physical Exam Vitals and nursing note reviewed.  Constitutional:      General: She is not in acute distress.    Appearance: She is well-developed.  HENT:     Head: Normocephalic and atraumatic.  Eyes:     Conjunctiva/sclera: Conjunctivae normal.  Cardiovascular:     Rate and Rhythm: Normal rate and regular rhythm.     Heart sounds: No murmur heard.   Pulmonary:     Effort: Pulmonary effort is normal. No respiratory distress.     Breath sounds: Normal breath sounds.  Abdominal:     Palpations: Abdomen is soft.     Tenderness: There is no abdominal tenderness.  Genitourinary:    Comments: Suprapubic catheter intact Musculoskeletal:     Cervical back: Neck supple.  Skin:    General: Skin is warm and dry.  Neurological:     Mental Status: She is alert.     ED Results / Procedures / Treatments   Labs (all labs ordered are listed, but only abnormal results are displayed) Labs Reviewed  URINALYSIS, ROUTINE W REFLEX MICROSCOPIC - Abnormal; Notable for the following components:      Result Value   APPearance HAZY (*)    pH 8.5 (*)    Protein, ur 30 (*)    Nitrite POSITIVE (*)    Leukocytes,Ua TRACE (*)    Crystals PRESENT (*)    All other components within normal limits    EKG None  Radiology No results found.  Procedures BLADDER CATHETERIZATION  Date/Time: 05/30/2020 10:13 PM Performed by: Milagros Loll, MD Authorized by:  Milagros Loll, MD   Consent:    Consent obtained:  Verbal   Consent given by:  Patient   Risks, benefits, and alternatives were discussed: yes     Risks discussed:  False passage, incomplete procedure, pain and infection   Alternatives discussed:  No treatment, delayed treatment, alternative treatment and observation Universal protocol:    Immediately prior to procedure, a time out was called: yes     Patient identity confirmed:  Verbally with patient Procedure details:    Catheter insertion:  Indwelling   Catheter type:  Foley   Catheter size:  14 Fr  Number of attempts:  1   Urine characteristics:  Yellow Post-procedure details:    Procedure completion:  Tolerated well, no immediate complications Comments:     Prepped draped in sterile fashion. Deflated balloon on existing catheter, removed with out difficulty, prepped suprapubic site with Betadine solution.  Then inserted 14 French Foley catheter without difficulty.  Return of urine via Foley catheter.  Inflated 8 mL of sterile saline solution to inflate balloon.  Patient tolerated procedure well, no immediate complications.     Medications Ordered in ED Medications - No data to display  ED Course  I have reviewed the triage vital signs and the nursing notes.  Pertinent labs & imaging results that were available during my care of the patient were reviewed by me and considered in my medical decision making (see chart for details).    MDM Rules/Calculators/A&P                         80 year old lady presents to ER with concern for clogged suprapubic catheter.  Unable to flush.  Suspect her catheter is clogged.  Replaced at bedside without difficulty.  UA negative for infection discharged home, recommended follow-up with her urologist.   After the discussed management above, the patient was determined to be safe for discharge.  The patient was in agreement with this plan and all questions regarding their care were  answered.  ED return precautions were discussed and the patient will return to the ED with any significant worsening of condition.   Final Clinical Impression(s) / ED Diagnoses Final diagnoses:  Suprapubic catheter [facility])    Rx / DC Orders ED Discharge Orders    None       Milagros Loll, MD 05/30/20 2217

## 2020-05-30 NOTE — Discharge Instructions (Addendum)
Follow-up with your urologist next week.  Return for abdominal pain, vomiting, fever or other new concerning symptoms

## 2020-05-30 NOTE — ED Notes (Signed)
Bladder scan 36mL.

## 2020-06-06 ENCOUNTER — Other Ambulatory Visit: Payer: Self-pay

## 2020-06-06 ENCOUNTER — Emergency Department (HOSPITAL_COMMUNITY)
Admission: EM | Admit: 2020-06-06 | Discharge: 2020-06-07 | Disposition: A | Discharge status: Home / Self Care | Payer: PPO | Attending: Emergency Medicine | Admitting: Emergency Medicine

## 2020-06-06 ENCOUNTER — Encounter (HOSPITAL_COMMUNITY): Payer: Self-pay | Admitting: Emergency Medicine

## 2020-06-06 DIAGNOSIS — Z79899 Other long term (current) drug therapy: Secondary | ICD-10-CM | POA: Insufficient documentation

## 2020-06-06 DIAGNOSIS — T83098A Other mechanical complication of other indwelling urethral catheter, initial encounter: Secondary | ICD-10-CM | POA: Diagnosis not present

## 2020-06-06 DIAGNOSIS — I10 Essential (primary) hypertension: Secondary | ICD-10-CM | POA: Insufficient documentation

## 2020-06-06 DIAGNOSIS — T83090A Other mechanical complication of cystostomy catheter, initial encounter: Secondary | ICD-10-CM

## 2020-06-06 DIAGNOSIS — T83198A Other mechanical complication of other urinary devices and implants, initial encounter: Secondary | ICD-10-CM | POA: Diagnosis not present

## 2020-06-06 MED ORDER — SULFAMETHOXAZOLE-TRIMETHOPRIM 800-160 MG PO TABS
1.0000 | ORAL_TABLET | Freq: Once | ORAL | Status: AC
  Administered 2020-06-06: 1 via ORAL
  Filled 2020-06-06: qty 1

## 2020-06-06 NOTE — Discharge Instructions (Addendum)
Take Bactrim as directed. A urine culture was sent tonight that can be reviewed with urology in follow up next week.   Return to the ED with any new or concerning symptoms.

## 2020-06-06 NOTE — ED Notes (Signed)
Catheter kit placed at bedside for provider.

## 2020-06-06 NOTE — ED Notes (Signed)
Lozer, NT applied a leg bag to catheter.

## 2020-06-06 NOTE — ED Triage Notes (Signed)
Patient with supra pubic cath that is probably clogged.  Patient's husband states that this happened about 2 months ago.  Husband states that she did not have a UTI per MD earlier in the week.

## 2020-06-06 NOTE — ED Provider Notes (Signed)
[facility] EMERGENCY DEPARTMENT Provider Note   CSN: [id number] Arrival date & time: 06/06/20  2006     History No chief complaint on file.   Kelly Cole is a 80 y.o. female.  Patient to ED for evaluation of clogged suprapubic catheter. She has a history of MS and the catheter has been there for many years. She and husband report that it recently clogged for the first time requiring replacement in the ED, treated for UTI at that time. Per chart review, she was seen on 4/22 and had an 14Fr placed. Patient reports there has been no problem since that time. No pain, fever, nausea. No hematuria.   The history is provided by the patient. No language interpreter was used.       Past Medical History:  Diagnosis Date  . HTN (hypertension)   . Multiple sclerosis [facility])     Patient Active Problem List   Diagnosis Date Noted  . 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 Spasticity 04/16/2019  . Contracture of ankle and foot joint, unspecified laterality 04/16/2019  . Degenerative disc disease, lumbar 03/19/2019  . Multiple sclerosis (HCC) 03/19/2019  . Scoliosis 03/19/2019    Past Surgical History:  Procedure Laterality Date  . ABDOMINAL HYSTERECTOMY    . APPENDECTOMY    . CHOLECYSTECTOMY    . HYSTERCTOMY       OB History   No obstetric history on file.     No family history on file.  Social History   Tobacco Use  . Smoking status: Never Smoker  . Smokeless tobacco: Never Used  Vaping Use  . Vaping Use: Never used  Substance Use Topics  . Alcohol use: Yes  . Drug use: Never    Home Medications Prior to Admission medications   Medication Sig Start Date End Date Taking? Authorizing Provider  baclofen (LIORESAL) 10 MG tablet Take 1 tablet (10 mg total) by mouth 2 (two) times daily. X 2 weeks then 3x/day- for spasticity 06/18/19   Lovorn, Aundra Millet, MD  buPROPion (ZYBAN) 150 MG 12 hr tablet Take 450 mg by mouth every morning. 09/13/18    [provider]  busPIRone (BUSPAR) 10 MG tablet Take 0.5 mg by mouth 3 (three) times daily.    [provider]  cephALEXin (KEFLEX) 500 MG capsule Take 1 capsule (500 mg total) by mouth 3 (three) times daily. 07/13/19   Linwood Dibbles, MD  collagenase (SANTYL) ointment Apply 1 application topically daily.    [provider]  cyanocobalamin (,VITAMIN B-12,) 1000 MCG/ML injection Inject 1,000 mcg into the muscle 3 (three) times a week. 09/06/18   [provider]  gabapentin (NEURONTIN) 100 MG capsule Take 200 mg by mouth 2 (two) times daily. 12/05/19   [provider]  gabapentin (NEURONTIN) 300 MG capsule Take 300 mg by mouth 2 (two) times daily.    [provider]  ipratropium (ATROVENT) 0.03 % nasal spray As directed 06/01/19   [provider]  lisinopril (ZESTRIL) 40 MG tablet Take 40 mg by mouth daily. 08/14/18   [provider]  mirtazapine (REMERON) 30 MG tablet Take 30 mg by mouth at bedtime. 09/13/18   [provider]  propranolol (INDERAL) 10 MG tablet Take 3 tablets (30 mg total) by mouth 2 (two) times daily. Patient not taking: Reported on 12/13/2019 10/10/19   Drema Dallas, DO  propranolol ER (INDERAL LA) 80 MG 24 hr capsule Take 80 mg by mouth daily. 12/05/19  [provider]  QUEtiapine (SEROQUEL) 100 MG tablet Take 100 mg by mouth at bedtime. 09/13/18   [provider]  temazepam (RESTORIL) 15 MG capsule Take 30 mg by mouth at bedtime. 09/21/18   [provider]  tretinoin (RETIN-A) 0.025 % cream APP EXT TO FACE QD IN THE EVE 09/04/18   [provider]  venlafaxine XR (EFFEXOR-XR) 150 MG 24 hr capsule Take 300 mg by mouth every morning. 09/13/18   [provider]    Allergies    Demerol  [meperidine]  Review of Systems   Review of Systems  Constitutional: Negative for chills and fever.  Gastrointestinal: Negative.  Negative for abdominal pain and nausea.  Genitourinary:  Negative for hematuria.       See HPI.  Musculoskeletal: Negative.  Negative for back pain.  Skin: Negative.   Neurological: Negative.  Negative for weakness and light-headedness.    Physical Exam Updated Vital Signs BP 140/62 (BP Location: Left Arm)   Pulse 67   Temp 98.8 F (37.1 C) (Oral)   Resp 18   SpO2 99%   Physical Exam Constitutional:      Appearance: She is well-developed.  Pulmonary:     Effort: Pulmonary effort is normal.  Abdominal:     Palpations: Abdomen is soft.     Tenderness: There is no abdominal tenderness.  Genitourinary:    Comments: Suprapubic catheter in place, site unremarkable. There is clear yellow urine in the drainage bag.  Musculoskeletal:     Cervical back: Normal range of motion.  Skin:    General: Skin is warm and dry.  Neurological:     Mental Status: She is alert and oriented to person, place, and time.     ED Results / Procedures / Treatments   Labs (all labs ordered are listed, but only abnormal results are displayed) Labs Reviewed  URINALYSIS, ROUTINE W REFLEX MICROSCOPIC    EKG None  Radiology No results found.  Procedures SUPRAPUBIC TUBE PLACEMENT  Date/Time: 06/06/2020 10:57 PM Performed by: Elpidio Anis, PA-C Authorized by: Elpidio Anis, PA-C   Consent:    Consent obtained:  Verbal   Consent given by:  Patient   Risks discussed:  Bleeding and infection Universal protocol:    Procedure explained and questions answered to patient or proxy's satisfaction: yes     Immediately prior to procedure, a time out was called: yes     Patient identity confirmed:  Verbally with patient Sedation:    Sedation type:  None Anesthesia:    Anesthesia method:  None Procedure details:    Complexity:  Simple   Catheter type:  Foley   Catheter size:  16 Fr   Ultrasound guidance: no     Number of attempts:  1   Urine characteristics:  Mildly cloudy Post-procedure details:    Procedure completion:  Tolerated well, no  immediate complications     Medications Ordered in ED Medications - No data to display  ED Course  I have reviewed the triage vital signs and the nursing notes.  Pertinent labs & imaging results that were available during my care of the patient were reviewed by me and considered in my medical decision making (see chart for details).    MDM Rules/Calculators/A&P                          Patient to ED with clogged suprapubic catheter. No fever, hematuria, or pain.   RN will  attempt to irrigate the tubing to clear. If this is unsuccessful, will replace catheter at bedside.   Catheter could not be irrigated. Catheter was replaced with a 16 Fr to avoid recurrence of clogging. UA reviewed from 4/22 and was nitrite positive, untreated. Will start on Bactrim. Culture pending from sample tonight to review with urology in follow up.   Final Clinical Impression(s) / ED Diagnoses Final diagnoses:  None   1. Suprapubic catheter problem  Rx / DC Orders ED Discharge Orders    None       Danne Harbor 06/06/20 2300    Jacalyn Lefevre, MD 06/06/20 2310

## 2020-06-06 NOTE — ED Triage Notes (Signed)
Emergency Medicine Provider Triage Evaluation Note  Kelly Cole , a 80 y.o. female  was evaluated in triage.  Pt complains of suprapubic catheter clogged. Husband at bedside states he tried irrigating the catheter at home without success.  Review of Systems  Positive: Clogged catheter Negative: abd pain, fver  Physical Exam  There were no vitals taken for this visit. Gen:   Awake, no distress   HEENT:  Atraumatic  Resp:  Normal effort  Cardiac:  Normal rate  Abd:   Nondistended MSK:   Moves extremities without difficulty  Neuro:  Speech clear   Medical Decision Making  Medically screening exam initiated at 9:49 PM.  Appropriate orders placed.  Kelly Cole was informed that the remainder of the evaluation will be completed by another provider, this initial triage assessment does not replace that evaluation, and the importance of remaining in the ED until their evaluation is complete.  Clinical Impression   MSE was initiated and I personally evaluated the patient and placed orders (if any) at  9:49 PM on June 06, 2020.  The patient appears stable so that the remainder of the MSE may be completed by another provider.    Karrie Meres, [place] 06/06/20 2149

## 2020-06-07 LAB — URINALYSIS, ROUTINE W REFLEX MICROSCOPIC
Bilirubin Urine: NEGATIVE
Glucose, UA: NEGATIVE mg/dL
Ketones, ur: NEGATIVE mg/dL
Nitrite: NEGATIVE
Protein, ur: 30 mg/dL — AB
RBC / HPF: >50 RBC/hpf — ABNORMAL HIGH (ref 0–5)
Specific Gravity, Urine: 1.017 (ref 1.005–1.030)
WBC, UA: >50 WBC/hpf — ABNORMAL HIGH (ref 0–5)
pH: 6 (ref 5.0–8.0)

## 2020-06-07 MED ORDER — SULFAMETHOXAZOLE-TRIMETHOPRIM 800-160 MG PO TABS
1.0000 | ORAL_TABLET | Freq: Two times a day (BID) | ORAL | 0 refills | Status: AC
Start: 2020-06-07 — End: 2020-06-14

## 2020-06-09 LAB — URINE CULTURE: Culture: >=100000 — AB

## 2020-06-10 ENCOUNTER — Telehealth: Payer: Self-pay | Admitting: Emergency Medicine

## 2020-06-10 NOTE — Telephone Encounter (Signed)
Post ED Visit - Positive Culture Follow-up  Culture report reviewed by antimicrobial stewardship pharmacist: [facility] []  , Pharm.D. []  Enzo Bi, Pharm.D., BCPS AQ-ID []  , Pharm.D., BCPS []  Celedonio Miyamoto, Pharm.D., BCPS []  [place], Garvin Fila.D., BCPS, AAHIVP []  , Pharm.D., BCPS, AAHIVP []  Georgina Pillion, PharmD, BCPS []  , PharmD, BCPS []  [place], PharmD, BCPS []  [address], PharmD []  , PharmD, BCPS []  Estella Husk, PharmD PharmD  [facility] []  , PharmD []  Phillips Climes, PharmD []  , PharmD []  Agapito Games, Rph []  ) Verlan Friends, PharmD []  , PharmD []  Mervyn Gay, PharmD []  , PharmD []  Vinnie Level, PharmD []  Laverna Peace, PharmD []  [facility], PharmD []  , PharmD []  Len Childs, PharmD   Positive urine culture Treated with sulfamethoxazole-trimethoprim, organism sensitive to the same and no further patient follow-up is required at this time.  06/10/2020, 9:44 AM

## 2020-06-12 ENCOUNTER — Ambulatory Visit: Payer: PPO | Admitting: Neurology

## 2020-06-12 DIAGNOSIS — T83098A Other mechanical complication of other indwelling urethral catheter, initial encounter: Secondary | ICD-10-CM | POA: Diagnosis not present

## 2020-06-16 ENCOUNTER — Ambulatory Visit: Payer: PPO | Admitting: Neurology

## 2020-06-17 DIAGNOSIS — R531 Weakness: Secondary | ICD-10-CM | POA: Diagnosis not present

## 2020-06-17 DIAGNOSIS — Z87440 Personal history of urinary (tract) infections: Secondary | ICD-10-CM | POA: Diagnosis not present

## 2020-06-17 DIAGNOSIS — Z9359 Other cystostomy status: Secondary | ICD-10-CM | POA: Diagnosis not present

## 2020-06-24 DIAGNOSIS — I10 Essential (primary) hypertension: Secondary | ICD-10-CM | POA: Diagnosis not present

## 2020-06-24 DIAGNOSIS — G35 Multiple sclerosis: Secondary | ICD-10-CM | POA: Diagnosis not present

## 2020-06-24 DIAGNOSIS — Z9359 Other cystostomy status: Secondary | ICD-10-CM | POA: Diagnosis not present

## 2020-06-24 DIAGNOSIS — R5383 Other fatigue: Secondary | ICD-10-CM | POA: Diagnosis not present

## 2020-06-29 ENCOUNTER — Emergency Department (HOSPITAL_BASED_OUTPATIENT_CLINIC_OR_DEPARTMENT_OTHER)
Admission: EM | Admit: 2020-06-29 | Discharge: 2020-06-30 | Disposition: A | Discharge status: Home / Self Care | Payer: PPO | Attending: Emergency Medicine | Admitting: Emergency Medicine

## 2020-06-29 ENCOUNTER — Encounter (HOSPITAL_BASED_OUTPATIENT_CLINIC_OR_DEPARTMENT_OTHER): Payer: Self-pay | Admitting: Obstetrics and Gynecology

## 2020-06-29 ENCOUNTER — Other Ambulatory Visit: Payer: Self-pay

## 2020-06-29 DIAGNOSIS — Z79899 Other long term (current) drug therapy: Secondary | ICD-10-CM | POA: Diagnosis not present

## 2020-06-29 DIAGNOSIS — N390 Urinary tract infection, site not specified: Secondary | ICD-10-CM

## 2020-06-29 DIAGNOSIS — T83098A Other mechanical complication of other indwelling urethral catheter, initial encounter: Secondary | ICD-10-CM | POA: Diagnosis not present

## 2020-06-29 DIAGNOSIS — I10 Essential (primary) hypertension: Secondary | ICD-10-CM | POA: Diagnosis not present

## 2020-06-29 DIAGNOSIS — T83510A Infection and inflammatory reaction due to cystostomy catheter, initial encounter: Secondary | ICD-10-CM | POA: Diagnosis not present

## 2020-06-29 DIAGNOSIS — Z9359 Other cystostomy status: Secondary | ICD-10-CM | POA: Insufficient documentation

## 2020-06-29 DIAGNOSIS — T839XXA Unspecified complication of genitourinary prosthetic device, implant and graft, initial encounter: Secondary | ICD-10-CM

## 2020-06-29 NOTE — ED Triage Notes (Signed)
Patient reports to the ER for a clogged suprapubic catheter. Patient uses a 16 french size and has a hx of obstruction and kidney stone. Patient has been reportedly peeing from her urethra since this blockage has occurred. Patient's husband attempted to flush it with sterile saline and could not flush it.

## 2020-06-30 DIAGNOSIS — G35 Multiple sclerosis: Secondary | ICD-10-CM | POA: Diagnosis not present

## 2020-06-30 DIAGNOSIS — T83098A Other mechanical complication of other indwelling urethral catheter, initial encounter: Secondary | ICD-10-CM | POA: Diagnosis not present

## 2020-06-30 DIAGNOSIS — N390 Urinary tract infection, site not specified: Secondary | ICD-10-CM | POA: Diagnosis not present

## 2020-06-30 DIAGNOSIS — N319 Neuromuscular dysfunction of bladder, unspecified: Secondary | ICD-10-CM | POA: Diagnosis not present

## 2020-06-30 LAB — CBC WITH DIFFERENTIAL/PLATELET
Abs Immature Granulocytes: 0.02 10*3/uL (ref 0.00–0.07)
Basophils Absolute: 0.1 10*3/uL (ref 0.0–0.1)
Basophils Relative: 1 %
Eosinophils Absolute: 0.4 10*3/uL (ref 0.0–0.5)
Eosinophils Relative: 5 %
HCT: 38.9 % (ref 36.0–46.0)
Hemoglobin: 12.3 g/dL (ref 12.0–15.0)
Immature Granulocytes: 0 %
Lymphocytes Relative: 16 %
Lymphs Abs: 1.3 10*3/uL (ref 0.7–4.0)
MCH: 29.6 pg (ref 26.0–34.0)
MCHC: 31.6 g/dL (ref 30.0–36.0)
MCV: 93.5 fL (ref 80.0–100.0)
Monocytes Absolute: 0.6 10*3/uL (ref 0.1–1.0)
Monocytes Relative: 8 %
Neutro Abs: 5.6 10*3/uL (ref 1.7–7.7)
Neutrophils Relative %: 70 %
Platelets: 230 10*3/uL (ref 150–400)
RBC: 4.16 MIL/uL (ref 3.87–5.11)
RDW: 13.2 % (ref 11.5–15.5)
WBC: 8 10*3/uL (ref 4.0–10.5)
nRBC: 0 % (ref 0.0–0.2)

## 2020-06-30 LAB — URINALYSIS, ROUTINE W REFLEX MICROSCOPIC
Bilirubin Urine: NEGATIVE
Glucose, UA: NEGATIVE mg/dL
Nitrite: POSITIVE — AB
Specific Gravity, Urine: 1.014 (ref 1.005–1.030)
WBC, UA: >50 WBC/hpf — ABNORMAL HIGH (ref 0–5)
pH: 6 (ref 5.0–8.0)

## 2020-06-30 LAB — BASIC METABOLIC PANEL
Anion gap: 10 (ref 5–15)
BUN: 30 mg/dL — ABNORMAL HIGH (ref 8–23)
CO2: 25 mmol/L (ref 22–32)
Calcium: 8.9 mg/dL (ref 8.9–10.3)
Chloride: 98 mmol/L (ref 98–111)
Creatinine, Ser: 0.43 mg/dL — ABNORMAL LOW (ref 0.44–1.00)
GFR, Estimated: >60 mL/min (ref >60)
Glucose, Bld: 96 mg/dL (ref 70–99)
Potassium: 4.4 mmol/L (ref 3.5–5.1)
Sodium: 133 mmol/L — ABNORMAL LOW (ref 135–145)

## 2020-06-30 MED ORDER — SULFAMETHOXAZOLE-TRIMETHOPRIM 800-160 MG PO TABS
1.0000 | ORAL_TABLET | Freq: Once | ORAL | Status: AC
  Administered 2020-06-30: 1 via ORAL
  Filled 2020-06-30: qty 1

## 2020-06-30 MED ORDER — SULFAMETHOXAZOLE-TRIMETHOPRIM 800-160 MG PO TABS: 1.0000 | ORAL_TABLET | Freq: Two times a day (BID) | ORAL | 0 refills | Status: AC

## 2020-06-30 MED ORDER — DIAZEPAM 2 MG PO TABS
2.0000 mg | ORAL_TABLET | Freq: Once | ORAL | Status: AC
  Administered 2020-06-30: 2 mg via ORAL
  Filled 2020-06-30: qty 1

## 2020-06-30 MED ORDER — PHENAZOPYRIDINE HCL 100 MG PO TABS: 95.0000 mg | ORAL_TABLET | Freq: Once | ORAL | Status: DC

## 2020-06-30 NOTE — ED Provider Notes (Signed)
[facility] EMERGENCY DEPT Provider Note   CSN: [id number] Arrival date & time: 06/29/20  1903     History Chief Complaint  Patient presents with  . Urinary Retention    Kelly Cole is a 80 y.o. female.  HPI     This is a 80 year old female with a history of hypertension, MS, suprapubic catheter who presents with catheter obstruction.  Husband reports that she has some recent issues with obstructed catheter.  He reports that they have seen urology.  She has been treated for 2 UTIs recently.  He states that since around 1 PM they have noted minimal output from her catheter.  She has been peeing and soaking her briefs.  He tried to flush the catheter at home with no success.  Reports that she is due for suprapubic catheter change in a couple of days.  He also notes that over the last few months she has had increasing bladder spasms.  She denies any recent fevers or back pain.  She reports occasional bladder spasm which causes discomfort but is in no significant pain.  Past Medical History:  Diagnosis Date  . HTN (hypertension)   . Multiple sclerosis [facility])     Patient Active Problem List   Diagnosis Date Noted  . 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 Spasticity 04/16/2019  . Contracture of ankle and foot joint, unspecified laterality 04/16/2019  . Degenerative disc disease, lumbar 03/19/2019  . Multiple sclerosis ([place]) 03/19/2019  . Scoliosis 03/19/2019    Past Surgical History:  Procedure Laterality Date  . ABDOMINAL HYSTERECTOMY    . APPENDECTOMY    . CHOLECYSTECTOMY    . HYSTERCTOMY       OB History   No obstetric history on file.     No family history on file.  Social History   Tobacco Use  . Smoking status: Never Smoker  . Smokeless tobacco: Never Used  Vaping Use  . Vaping Use: Never used  Substance Use Topics  . Alcohol use: Yes  . Drug use: Never    Home Medications Prior to Admission medications   Medication  Sig Start Date End Date Taking? Authorizing Provider  sulfamethoxazole-trimethoprim (BACTRIM DS) 800-160 MG tablet Take 1 tablet by mouth 2 (two) times daily for 7 days. 06/30/20 07/07/20 Yes Bellanie Matthew, Barbette Hair, MD  baclofen (LIORESAL) 10 MG tablet Take 1 tablet (10 mg total) by mouth 2 (two) times daily. X 2 weeks then 3x/day- for spasticity 06/18/19   Lovorn, Jinny Blossom, MD  buPROPion (ZYBAN) 150 MG 12 hr tablet Take 450 mg by mouth every morning. 09/13/18   [provider]  busPIRone (BUSPAR) 10 MG tablet Take 0.5 mg by mouth 3 (three) times daily.    [provider]  cephALEXin (KEFLEX) 500 MG capsule Take 1 capsule (500 mg total) by mouth 3 (three) times daily. 07/13/19   Dorie Rank, MD  collagenase (SANTYL) ointment Apply 1 application topically daily.    [provider]  cyanocobalamin (,VITAMIN B-12,) 1000 MCG/ML injection Inject 1,000 mcg into the muscle 3 (three) times a week. 09/06/18   [provider]  gabapentin (NEURONTIN) 100 MG capsule Take 200 mg by mouth 2 (two) times daily. 12/05/19   [provider]  gabapentin (NEURONTIN) 300 MG capsule Take 300 mg by mouth 2 (two) times daily.    [provider]  ipratropium (ATROVENT) 0.03 % nasal spray As directed 06/01/19   [provider]  lisinopril (ZESTRIL) 40 MG tablet Take 40  mg by mouth daily. 08/14/18   [provider]  mirtazapine (REMERON) 30 MG tablet Take 30 mg by mouth at bedtime. 09/13/18   [provider]  propranolol (INDERAL) 10 MG tablet Take 3 tablets (30 mg total) by mouth 2 (two) times daily. Patient not taking: Reported on 12/13/2019 10/10/19   Pieter Partridge, DO  propranolol ER (INDERAL LA) 80 MG 24 hr capsule Take 80 mg by mouth daily. 12/05/19   [provider]  QUEtiapine (SEROQUEL) 100 MG tablet Take 100 mg by mouth at bedtime. 09/13/18   [provider]  temazepam (RESTORIL) 15 MG capsule Take 30 mg by mouth at bedtime. 09/21/18   [provider]  tretinoin (RETIN-A) 0.025 % cream APP EXT TO FACE QD IN THE EVE 09/04/18   [provider]  venlafaxine XR (EFFEXOR-XR) 150 MG 24 hr capsule Take 300 mg by mouth every morning. 09/13/18   [provider]    Allergies    Demerol  [meperidine]  Review of Systems   Review of Systems  Constitutional: Negative for fever.  Respiratory: Negative for shortness of breath.   Cardiovascular: Negative for chest pain.  Gastrointestinal: Negative for abdominal pain, nausea and vomiting.  Genitourinary: Positive for difficulty urinating and pelvic pain.  All other systems reviewed and are negative.   Physical Exam Updated Vital Signs BP (!) 162/56   Pulse 70   Temp 97.7 F (36.5 C)   Resp 18   Ht 1.664 m (5' 5.5")   SpO2 100%   BMI 19.50 kg/m   Physical Exam Vitals and nursing note reviewed.  Constitutional:      Appearance: She is well-developed. She is not toxic-appearing.  HENT:     Head: Normocephalic and atraumatic.     Mouth/Throat:     Mouth: Mucous membranes are moist.  Eyes:     Pupils: Pupils are equal, round, and reactive to light.  Cardiovascular:     Rate and Rhythm: Normal rate and regular rhythm.     Heart sounds: Normal heart sounds.  Pulmonary:     Effort: Pulmonary effort is normal. No respiratory distress.     Breath sounds: No wheezing.  Abdominal:     General: Bowel sounds are normal.     Palpations: Abdomen is soft.     Tenderness: There is guarding.  Genitourinary:    Comments: Suprapubic catheter noted with some scant blood at the suprapubic opening, approximately 50 cc of urine in leg bag Musculoskeletal:     Cervical back: Neck supple.     Comments: Contractures noted  Skin:    General: Skin is warm and dry.  Neurological:     Mental Status: She is alert and oriented to person, place, and time.  Psychiatric:        Mood and Affect: Mood normal.     ED Results / Procedures / Treatments   Labs (all labs ordered  are listed, but only abnormal results are displayed) Labs Reviewed  URINALYSIS, ROUTINE W REFLEX MICROSCOPIC - Abnormal; Notable for the following components:      Result Value   APPearance HAZY (*)    Hgb urine dipstick LARGE (*)    Ketones, ur TRACE (*)    Protein, ur TRACE (*)    Nitrite POSITIVE (*)    Leukocytes,Ua LARGE (*)    WBC, UA >50 (*)    Bacteria, UA RARE (*)    All other components within normal limits  BASIC METABOLIC PANEL -  Abnormal; Notable for the following components:   Sodium 133 (*)    BUN 30 (*)    Creatinine, Ser 0.43 (*)    All other components within normal limits  URINE CULTURE  CBC WITH DIFFERENTIAL/PLATELET    EKG None  Radiology No results found.  Procedures Procedures   Medications Ordered in ED Medications  sulfamethoxazole-trimethoprim (BACTRIM DS) 800-160 MG per tablet 1 tablet (has no administration in time range)  diazepam (VALIUM) tablet 2 mg (has no administration in time range)    ED Course  I have reviewed the triage vital signs and the nursing notes.  Pertinent labs & imaging results that were available during my care of the patient were reviewed by me and considered in my medical decision making (see chart for details).  Clinical Course as of 06/30/20 0246  Mon Jun 30, 2020  0100 Attempted suprapubic catheter replacement.  8 to 9 cc were extracted from the Foley balloon.  Met significant resistance with pulling out the catheter.  Patient with significant pain.  Attempted to remove additional saline from the balloon and the balloon appeared to be completely deflated.  Again attempts for removal were met with significant resistance and pain for the patient. [CH]  0200 Spoke with Dr. Claudia Desanctis, urology.  Given resistance with trying to remove the suprapubic catheter, recommends placing urethral catheter and follow-up in the office later today for suprapubic catheter evaluation and removal. [CH]    Clinical Course User Index [CH]  Liyah Higham, Barbette Hair, MD   MDM Rules/Calculators/A&P                          Patient presents today with concern for clogged suprapubic Foley catheter.  History of the same recently with increased bladder spasm and a UTI.  She is nontoxic-appearing and vital signs are reassuring.  She is afebrile.  I attempted to replace her suprapubic catheter but met significant resistance with the bulb completely deflated.  Patient endorsed significant pain with attempts.  I inflated and deflated the balloon multiple times with full decompression but continued to meet significant obstruction to the point that the catheter was stretching and the patient was in significant pain.  I did discuss with urology.  I would not attempt further removal but will place a urethral catheter for drainage.  Patient only had about 20 to 50 cc out with the urethral catheter placement; however, she has been spontaneously voiding from her urethra so do not expect a full bladder necessarily.  Urinalysis does show nitrite positive urine with greater than 50 white cells and white cell clumps.  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  However, she recently grew out Proteus with a similar profile urinalysis.  We will send a culture.  Husband reports that these drainage issues seem to happen when she has a UTI.  We will start again on Bactrim and have her follow-up with urology both for evaluation for suprapubic exchange and to discuss whether to continue antibiotics versus waiting for culture.  After history, exam, and medical workup I feel the patient has been appropriately medically screened and is safe for discharge home. Pertinent diagnoses were discussed with the patient. Patient was given return precautions.  Final Clinical Impression(s) / ED Diagnoses Final diagnoses:  Foley catheter problem, initial encounter ([place])  Urinary tract infection associated with cystostomy catheter, initial encounter [facility])    Rx / DC  Orders ED Discharge Orders  Ordered    sulfamethoxazole-trimethoprim (BACTRIM DS) 800-160 MG tablet  2 times daily        06/30/20 0245           Becky Colan, Barbette Hair, MD 06/30/20 [phone]

## 2020-06-30 NOTE — ED Notes (Signed)
#  14 foley cath inserted using sterile technique with immediate cloudy urine noted. Tolerated well.

## 2020-06-30 NOTE — Discharge Instructions (Addendum)
You were seen today for catheter obstruction.  I was unable to replace the suprapubic catheter.  You had a second catheter placed in your urethra.  Call the urology office for follow-up later for further evaluation and suprapubic change.

## 2020-07-02 LAB — URINE CULTURE: Culture: >=100000 — AB

## 2020-07-03 ENCOUNTER — Telehealth: Payer: Self-pay | Admitting: *Deleted

## 2020-07-03 NOTE — Telephone Encounter (Signed)
Post ED Visit - Positive Culture Follow-up  Culture report reviewed by antimicrobial stewardship pharmacist: [facility] []  , Pharm.D. []  Enzo Bi, Pharm.D., BCPS AQ-ID []  , Pharm.D., BCPS []  Celedonio Miyamoto, Pharm.D., BCPS []  [place], Garvin Fila.D., BCPS, AAHIVP []  , Pharm.D., BCPS, AAHIVP []  Georgina Pillion, PharmD, BCPS []  , PharmD, BCPS []  [place], PharmD, BCPS []  [place], PharmD []  , PharmD, BCPS []  Estella Husk, PharmD  Pharmacy Team []  Lysle Pearl, PharmD []  , PharmD []  Phillips Climes, PharmD []  , Rph []  Agapito Games) , PharmD []  Verlan Friends, PharmD []  , PharmD []  Mervyn Gay, PharmD []  , PharmD []  Vinnie Level, PharmD []  [facility], PharmD []  , PharmD []  Len Childs, PharmD   Positive Urine culture Treated with Sulfamethoxazole-Trimethoprim, organism sensitive to the same and no further patient follow-up is required at this time.  , PharmD Greer Pickerel [facility] 07/03/2020, 10:37 AM

## 2020-07-30 DIAGNOSIS — R338 Other retention of urine: Secondary | ICD-10-CM | POA: Diagnosis not present

## 2020-08-08 DIAGNOSIS — R338 Other retention of urine: Secondary | ICD-10-CM | POA: Diagnosis not present

## 2020-08-08 DIAGNOSIS — N319 Neuromuscular dysfunction of bladder, unspecified: Secondary | ICD-10-CM | POA: Diagnosis not present

## 2020-08-13 DIAGNOSIS — R338 Other retention of urine: Secondary | ICD-10-CM | POA: Diagnosis not present

## 2020-08-13 DIAGNOSIS — N319 Neuromuscular dysfunction of bladder, unspecified: Secondary | ICD-10-CM | POA: Diagnosis not present

## 2020-08-27 DIAGNOSIS — N319 Neuromuscular dysfunction of bladder, unspecified: Secondary | ICD-10-CM | POA: Diagnosis not present

## 2020-08-27 DIAGNOSIS — R338 Other retention of urine: Secondary | ICD-10-CM | POA: Diagnosis not present

## 2020-09-08 DIAGNOSIS — N319 Neuromuscular dysfunction of bladder, unspecified: Secondary | ICD-10-CM | POA: Diagnosis not present

## 2020-09-08 DIAGNOSIS — G35 Multiple sclerosis: Secondary | ICD-10-CM | POA: Diagnosis not present

## 2020-09-12 DIAGNOSIS — R338 Other retention of urine: Secondary | ICD-10-CM | POA: Diagnosis not present

## 2020-09-19 ENCOUNTER — Encounter (HOSPITAL_COMMUNITY): Payer: Self-pay | Admitting: Emergency Medicine

## 2020-09-19 ENCOUNTER — Other Ambulatory Visit: Payer: Self-pay

## 2020-09-19 ENCOUNTER — Emergency Department (HOSPITAL_COMMUNITY)
Admission: EM | Admit: 2020-09-19 | Discharge: 2020-09-19 | Disposition: A | Discharge status: Home / Self Care | Payer: PPO | Source: Home / Self Care | Attending: Emergency Medicine | Admitting: Emergency Medicine

## 2020-09-19 DIAGNOSIS — G35 Multiple sclerosis: Secondary | ICD-10-CM | POA: Diagnosis present

## 2020-09-19 DIAGNOSIS — T859XXA Unspecified complication of internal prosthetic device, implant and graft, initial encounter: Secondary | ICD-10-CM

## 2020-09-19 DIAGNOSIS — M24576 Contracture, unspecified foot: Secondary | ICD-10-CM | POA: Diagnosis not present

## 2020-09-19 DIAGNOSIS — M24573 Contracture, unspecified ankle: Secondary | ICD-10-CM | POA: Diagnosis not present

## 2020-09-19 DIAGNOSIS — Z79899 Other long term (current) drug therapy: Secondary | ICD-10-CM | POA: Diagnosis not present

## 2020-09-19 DIAGNOSIS — Z7401 Bed confinement status: Secondary | ICD-10-CM | POA: Diagnosis not present

## 2020-09-19 DIAGNOSIS — R569 Unspecified convulsions: Secondary | ICD-10-CM | POA: Diagnosis present

## 2020-09-19 DIAGNOSIS — I6381 Other cerebral infarction due to occlusion or stenosis of small artery: Secondary | ICD-10-CM | POA: Diagnosis not present

## 2020-09-19 DIAGNOSIS — G3184 Mild cognitive impairment, so stated: Secondary | ICD-10-CM | POA: Diagnosis present

## 2020-09-19 DIAGNOSIS — R4020 Unspecified coma: Secondary | ICD-10-CM | POA: Diagnosis not present

## 2020-09-19 DIAGNOSIS — Z888 Allergy status to other drugs, medicaments and biological substances status: Secondary | ICD-10-CM | POA: Diagnosis not present

## 2020-09-19 DIAGNOSIS — Z9359 Other cystostomy status: Secondary | ICD-10-CM | POA: Diagnosis not present

## 2020-09-19 DIAGNOSIS — G319 Degenerative disease of nervous system, unspecified: Secondary | ICD-10-CM | POA: Diagnosis not present

## 2020-09-19 DIAGNOSIS — R0600 Dyspnea, unspecified: Secondary | ICD-10-CM | POA: Diagnosis not present

## 2020-09-19 DIAGNOSIS — R404 Transient alteration of awareness: Secondary | ICD-10-CM | POA: Diagnosis not present

## 2020-09-19 DIAGNOSIS — I10 Essential (primary) hypertension: Secondary | ICD-10-CM | POA: Insufficient documentation

## 2020-09-19 DIAGNOSIS — I6782 Cerebral ischemia: Secondary | ICD-10-CM | POA: Diagnosis not present

## 2020-09-19 DIAGNOSIS — R252 Cramp and spasm: Secondary | ICD-10-CM | POA: Diagnosis not present

## 2020-09-19 DIAGNOSIS — J690 Pneumonitis due to inhalation of food and vomit: Secondary | ICD-10-CM | POA: Diagnosis present

## 2020-09-19 DIAGNOSIS — T83091A Other mechanical complication of indwelling urethral catheter, initial encounter: Secondary | ICD-10-CM | POA: Insufficient documentation

## 2020-09-19 DIAGNOSIS — F419 Anxiety disorder, unspecified: Secondary | ICD-10-CM | POA: Diagnosis present

## 2020-09-19 DIAGNOSIS — J90 Pleural effusion, not elsewhere classified: Secondary | ICD-10-CM | POA: Diagnosis not present

## 2020-09-19 DIAGNOSIS — B964 Proteus (mirabilis) (morganii) as the cause of diseases classified elsewhere: Secondary | ICD-10-CM | POA: Diagnosis present

## 2020-09-19 DIAGNOSIS — R0902 Hypoxemia: Secondary | ICD-10-CM | POA: Diagnosis not present

## 2020-09-19 DIAGNOSIS — N390 Urinary tract infection, site not specified: Secondary | ICD-10-CM | POA: Diagnosis present

## 2020-09-19 DIAGNOSIS — F919 Conduct disorder, unspecified: Secondary | ICD-10-CM | POA: Diagnosis present

## 2020-09-19 DIAGNOSIS — Z7189 Other specified counseling: Secondary | ICD-10-CM | POA: Diagnosis not present

## 2020-09-19 DIAGNOSIS — Z993 Dependence on wheelchair: Secondary | ICD-10-CM | POA: Diagnosis not present

## 2020-09-19 DIAGNOSIS — G894 Chronic pain syndrome: Secondary | ICD-10-CM | POA: Diagnosis present

## 2020-09-19 DIAGNOSIS — N319 Neuromuscular dysfunction of bladder, unspecified: Secondary | ICD-10-CM | POA: Diagnosis present

## 2020-09-19 DIAGNOSIS — Z515 Encounter for palliative care: Secondary | ICD-10-CM | POA: Diagnosis not present

## 2020-09-19 DIAGNOSIS — Z66 Do not resuscitate: Secondary | ICD-10-CM | POA: Diagnosis present

## 2020-09-19 DIAGNOSIS — G250 Essential tremor: Secondary | ICD-10-CM | POA: Diagnosis present

## 2020-09-19 DIAGNOSIS — G4089 Other seizures: Secondary | ICD-10-CM | POA: Diagnosis not present

## 2020-09-19 DIAGNOSIS — J9811 Atelectasis: Secondary | ICD-10-CM | POA: Diagnosis present

## 2020-09-19 DIAGNOSIS — R4182 Altered mental status, unspecified: Secondary | ICD-10-CM | POA: Diagnosis not present

## 2020-09-19 DIAGNOSIS — G928 Other toxic encephalopathy: Secondary | ICD-10-CM | POA: Diagnosis present

## 2020-09-19 DIAGNOSIS — F330 Major depressive disorder, recurrent, mild: Secondary | ICD-10-CM | POA: Diagnosis present

## 2020-09-19 DIAGNOSIS — Z20822 Contact with and (suspected) exposure to covid-19: Secondary | ICD-10-CM | POA: Diagnosis present

## 2020-09-19 DIAGNOSIS — T83098A Other mechanical complication of other indwelling urethral catheter, initial encounter: Secondary | ICD-10-CM | POA: Diagnosis not present

## 2020-09-19 DIAGNOSIS — R000 Tachycardia, unspecified: Secondary | ICD-10-CM | POA: Diagnosis not present

## 2020-09-19 DIAGNOSIS — E871 Hypo-osmolality and hyponatremia: Secondary | ICD-10-CM | POA: Diagnosis present

## 2020-09-19 DIAGNOSIS — M4140 Neuromuscular scoliosis, site unspecified: Secondary | ICD-10-CM | POA: Diagnosis not present

## 2020-09-19 DIAGNOSIS — E876 Hypokalemia: Secondary | ICD-10-CM | POA: Diagnosis present

## 2020-09-19 LAB — CBC WITH DIFFERENTIAL/PLATELET
Abs Immature Granulocytes: 0.03 10*3/uL (ref 0.00–0.07)
Basophils Absolute: 0 10*3/uL (ref 0.0–0.1)
Basophils Relative: 0 %
Eosinophils Absolute: 0.2 10*3/uL (ref 0.0–0.5)
Eosinophils Relative: 3 %
HCT: 40.9 % (ref 36.0–46.0)
Hemoglobin: 12.6 g/dL (ref 12.0–15.0)
Immature Granulocytes: 0 %
Lymphocytes Relative: 18 %
Lymphs Abs: 1.3 10*3/uL (ref 0.7–4.0)
MCH: 28.8 pg (ref 26.0–34.0)
MCHC: 30.8 g/dL (ref 30.0–36.0)
MCV: 93.6 fL (ref 80.0–100.0)
Monocytes Absolute: 0.7 10*3/uL (ref 0.1–1.0)
Monocytes Relative: 9 %
Neutro Abs: 5 10*3/uL (ref 1.7–7.7)
Neutrophils Relative %: 70 %
Platelets: 250 10*3/uL (ref 150–400)
RBC: 4.37 MIL/uL (ref 3.87–5.11)
RDW: 13.3 % (ref 11.5–15.5)
WBC: 7.3 10*3/uL (ref 4.0–10.5)
nRBC: 0 % (ref 0.0–0.2)

## 2020-09-19 LAB — I-STAT CHEM 8, ED
BUN: 6 mg/dL — ABNORMAL LOW (ref 8–23)
BUN: 10 mg/dL (ref 8–23)
Calcium, Ion: 0.56 mmol/L — CL (ref 1.15–1.40)
Calcium, Ion: 1.13 mmol/L — ABNORMAL LOW (ref 1.15–1.40)
Chloride: 113 mmol/L — ABNORMAL HIGH (ref 98–111)
Chloride: 94 mmol/L — ABNORMAL LOW (ref 98–111)
Creatinine, Ser: <0.2 mg/dL — ABNORMAL LOW (ref 0.44–1.00)
Creatinine, Ser: 0.4 mg/dL — ABNORMAL LOW (ref 0.44–1.00)
Glucose, Bld: 58 mg/dL — ABNORMAL LOW (ref 70–99)
Glucose, Bld: 92 mg/dL (ref 70–99)
HCT: 21 % — ABNORMAL LOW (ref 36.0–46.0)
HCT: 42 % (ref 36.0–46.0)
Hemoglobin: 7.1 g/dL — ABNORMAL LOW (ref 12.0–15.0)
Hemoglobin: 14.3 g/dL (ref 12.0–15.0)
Potassium: 3.8 mmol/L (ref 3.5–5.1)
Potassium: 4.2 mmol/L (ref 3.5–5.1)
Sodium: 139 mmol/L (ref 135–145)
Sodium: 131 mmol/L — ABNORMAL LOW (ref 135–145)
TCO2: 15 mmol/L — ABNORMAL LOW (ref 22–32)
TCO2: 27 mmol/L (ref 22–32)

## 2020-09-19 LAB — URINALYSIS, ROUTINE W REFLEX MICROSCOPIC
Bilirubin Urine: NEGATIVE
Glucose, UA: NEGATIVE mg/dL
Ketones, ur: NEGATIVE mg/dL
Nitrite: NEGATIVE
Protein, ur: 100 mg/dL — AB
RBC / HPF: >50 RBC/hpf — ABNORMAL HIGH (ref 0–5)
Specific Gravity, Urine: 1.008 (ref 1.005–1.030)
pH: 6 (ref 5.0–8.0)

## 2020-09-19 MED ORDER — PROPRANOLOL HCL 1 MG/ML IV SOLN
1.0000 mg | Freq: Once | INTRAVENOUS | Status: DC
  Filled 2020-09-19: qty 1

## 2020-09-19 MED ORDER — HYDRALAZINE HCL 20 MG/ML IJ SOLN: 10.0000 mg | Freq: Once | INTRAMUSCULAR | Status: DC

## 2020-09-19 MED ORDER — MORPHINE SULFATE (PF) 4 MG/ML IV SOLN
4.0000 mg | Freq: Once | INTRAVENOUS | Status: AC
Start: 2020-09-19 — End: 2020-09-19
  Administered 2020-09-19: 4 mg via INTRAVENOUS
  Filled 2020-09-19: qty 1

## 2020-09-19 MED ORDER — HYDRALAZINE HCL 20 MG/ML IJ SOLN
10.0000 mg | Freq: Once | INTRAMUSCULAR | Status: AC
  Administered 2020-09-19: 10 mg via INTRAVENOUS
  Filled 2020-09-19: qty 1

## 2020-09-19 MED ORDER — SODIUM CHLORIDE 0.9 % IV BOLUS
1000.0000 mL | Freq: Once | INTRAVENOUS | Status: AC
  Administered 2020-09-19: 1000 mL via INTRAVENOUS

## 2020-09-19 NOTE — Discharge Instructions (Addendum)
The catheter in works well.  Please stay hydrated.  There is no obvious infection.  See your urologist for follow-up on Monday  Return to ER if your catheter is clogged, blood clots in urine

## 2020-09-19 NOTE — ED Provider Notes (Signed)
[facility]-EMERGENCY DEPT Provider Note   CSN: [id number] Arrival date & time: 09/19/20  1755     History Chief Complaint  Patient presents with   Urinary Retention    Kelly Cole is a 80 y.o. female hx of HTN, multiple sclerosis who is bedbound and has a suprapubic catheter here presenting with clotted catheter.  Per the husband, patient has frequent clotted catheter.  Her catheter got clotted about a week ago and was changed to [facility].  It got clotted this morning again.  Her last urine output was yesterday. Patient has not been drinking much fluids.  She has a lot of suprapubic pressure and urinate around the catheter  The history is provided by the patient.      Past Medical History:  Diagnosis Date   HTN (hypertension)    Multiple sclerosis (HCC)     Patient Active Problem List   Diagnosis Date Noted   Scoliosis (and kyphoscoliosis), idiopathic 04/16/2019   Spasticity 04/16/2019   Contracture of ankle and foot joint, unspecified laterality 04/16/2019   Degenerative disc disease, lumbar 03/19/2019   Multiple sclerosis (HCC) 03/19/2019   Scoliosis 03/19/2019    Past Surgical History:  Procedure Laterality Date   ABDOMINAL HYSTERECTOMY     APPENDECTOMY     CHOLECYSTECTOMY     HYSTERCTOMY       OB History   No obstetric history on file.     No family history on file.  Social History   Tobacco Use   Smoking status: Never   Smokeless tobacco: Never  Vaping Use   Vaping Use: Never used  Substance Use Topics   Alcohol use: Yes   Drug use: Never    Home Medications Prior to Admission medications   Medication Sig Start Date End Date Taking? Authorizing Provider  baclofen (LIORESAL) 10 MG tablet Take 1 tablet (10 mg total) by mouth 2 (two) times daily. X 2 weeks then 3x/day- for spasticity 06/18/19   Lovorn, Aundra Millet, MD  buPROPion (ZYBAN) 150 MG 12 hr tablet Take 450 mg by mouth every morning. 09/13/18   [provider]   busPIRone (BUSPAR) 10 MG tablet Take 0.5 mg by mouth 3 (three) times daily.    [provider]  cephALEXin (KEFLEX) 500 MG capsule Take 1 capsule (500 mg total) by mouth 3 (three) times daily. 07/13/19   Linwood Dibbles, MD  collagenase (SANTYL) ointment Apply 1 application topically daily.    [provider]  cyanocobalamin (,VITAMIN B-12,) 1000 MCG/ML injection Inject 1,000 mcg into the muscle 3 (three) times a week. 09/06/18   [provider]  gabapentin (NEURONTIN) 100 MG capsule Take 200 mg by mouth 2 (two) times daily. 12/05/19   [provider]  gabapentin (NEURONTIN) 300 MG capsule Take 300 mg by mouth 2 (two) times daily.    [provider]  ipratropium (ATROVENT) 0.03 % nasal spray As directed 06/01/19   [provider]  lisinopril (ZESTRIL) 40 MG tablet Take 40 mg by mouth daily. 08/14/18   [provider]  mirtazapine (REMERON) 30 MG tablet Take 30 mg by mouth at bedtime. 09/13/18   [provider]  propranolol (INDERAL) 10 MG tablet Take 3 tablets (30 mg total) by mouth 2 (two) times daily. Patient not taking: Reported on 12/13/2019 10/10/19   Drema Dallas, DO  propranolol ER (INDERAL LA) 80 MG 24 hr capsule Take 80 mg by mouth daily. 12/05/19   [provider]  QUEtiapine (  SEROQUEL) 100 MG tablet Take 100 mg by mouth at bedtime. 09/13/18   [provider]  temazepam (RESTORIL) 15 MG capsule Take 30 mg by mouth at bedtime. 09/21/18   [provider]  tretinoin (RETIN-A) 0.025 % cream APP EXT TO FACE QD IN THE EVE 09/04/18   [provider]  venlafaxine XR (EFFEXOR-XR) 150 MG 24 hr capsule Take 300 mg by mouth every morning. 09/13/18   [provider]    Allergies    Demerol  [meperidine]  Review of Systems   Review of Systems  Genitourinary:  Positive for difficulty urinating.  All other systems reviewed and are negative.  Physical Exam Updated Vital Signs BP (!) 154/73   Pulse  91   Temp 97.7 F (36.5 C) (Oral)   Resp 14   SpO2 98%   Physical Exam Vitals and nursing note reviewed.  Constitutional:      Comments: Chronically ill  HENT:     Head: Normocephalic.     Nose: Nose normal.     Mouth/Throat:     Mouth: Mucous membranes are dry.  Eyes:     Extraocular Movements: Extraocular movements intact.     Pupils: Pupils are equal, round, and reactive to light.  Cardiovascular:     Rate and Rhythm: Normal rate and regular rhythm.     Pulses: Normal pulses.     Heart sounds: Normal heart sounds.  Pulmonary:     Effort: Pulmonary effort is normal.     Breath sounds: Normal breath sounds.  Abdominal:     General: Abdomen is flat.     Palpations: Abdomen is soft.     Comments: Suprapubic tenderness, catheter in place   Musculoskeletal:        General: Normal range of motion.     Cervical back: Normal range of motion and neck supple.  Skin:    General: Skin is warm.     Capillary Refill: Capillary refill takes less than 2 seconds.  Neurological:     General: No focal deficit present.     Mental Status: She is alert and oriented to person, place, and time.  Psychiatric:        Mood and Affect: Mood normal.        Behavior: Behavior normal.    ED Results / Procedures / Treatments   Labs (all labs ordered are listed, but only abnormal results are displayed) Labs Reviewed  URINALYSIS, ROUTINE W REFLEX MICROSCOPIC - Abnormal; Notable for the following components:      Result Value   APPearance HAZY (*)    Hgb urine dipstick LARGE (*)    Protein, ur 100 (*)    Leukocytes,Ua SMALL (*)    RBC / HPF >50 (*)    Bacteria, UA RARE (*)    All other components within normal limits  I-STAT CHEM 8, ED - Abnormal; Notable for the following components:   Chloride 113 (*)    BUN 6 (*)    Creatinine, Ser <0.20 (*)    Glucose, Bld 58 (*)    Calcium, Ion 0.56 (*)    TCO2 15 (*)    Hemoglobin 7.1 (*)    HCT 21.0 (*)    All other components within normal  limits  I-STAT CHEM 8, ED - Abnormal; Notable for the following components:   Sodium 131 (*)    Chloride 94 (*)    Creatinine, Ser 0.40 (*)    Calcium, Ion 1.13 (*)  All other components within normal limits  URINE CULTURE  CBC WITH DIFFERENTIAL/PLATELET  BASIC METABOLIC PANEL    EKG None  Radiology No results found.  Procedures BLADDER CATHETERIZATION  Date/Time: 09/19/2020 7:59 PM Performed by: Charlynne Pander, MD Authorized by: Charlynne Pander, MD   Consent:    Consent obtained:  Verbal   Consent given by:  Patient   Risks, benefits, and alternatives were discussed: yes     Risks discussed:  False passage and infection   Alternatives discussed:  No treatment Universal protocol:    Procedure explained and questions answered to patient or proxy's satisfaction: yes     Relevant documents present and verified: yes     Test results available: yes     Patient identity confirmed:  Verbally with patient Pre-procedure details:    Procedure purpose:  Diagnostic Procedure details:    Provider performed due to:  Altered anatomy   Altered anatomy details: suprapubic catheter.   Catheter insertion:  Indwelling   Catheter type:  Coude   Catheter size:  18 Fr   Bladder irrigation: yes     Number of attempts:  1   Urine characteristics:  Blood-tinged Post-procedure details:    Procedure completion:  Tolerated     Medications Ordered in ED Medications  sodium chloride 0.9 % bolus 1,000 mL (0 mLs Intravenous Stopped 09/19/20 2056)  morphine 4 MG/ML injection 4 mg (4 mg Intravenous Given 09/19/20 1945)  hydrALAZINE (APRESOLINE) injection 10 mg (10 mg Intravenous Given 09/19/20 2028)    ED Course  I have reviewed the triage vital signs and the nursing notes.  Pertinent labs & imaging results that were available during my care of the patient were reviewed by me and considered in my medical decision making (see chart for details).    MDM Rules/Calculators/A&P                           Kelly Cole is a 80 y.o. female who presented with clotted Foley catheter.  Patient's catheter was clotted.  I attempted to irrigate it but was unsuccessful.  I was able to replace the catheter with another 18 French Foley catheter. There is a blood clot that I irrigated out.  Then the catheter flowed out cloudy urine. Will get cbc, bmp, UA, urine culture.    10:15 PM CBC is stable.  Initial i-STAT Chem-8 was drawn from the IV and was hemodiluted.  Repeat i-STAT showed normal creatinine.  UA showed no obvious infection.  We will hold off antibiotics and send urine culture.  We will have patient follow-up with urology.  Patient is able to urinate well after given IV fluids.  Final Clinical Impression(s) / ED Diagnoses Final diagnoses:  None    Rx / DC Orders ED Discharge Orders     None        Charlynne Pander, MD 09/19/20 2216

## 2020-09-19 NOTE — ED Triage Notes (Signed)
Patient here from home reporting clogged suprapubic catheter. Hx of same.

## 2020-09-19 NOTE — ED Notes (Signed)
Dr. Silverio Lay notified about patients BP.

## 2020-09-19 NOTE — ED Notes (Signed)
Dr. Silverio Lay inserted new foley catheter prior to discharge.

## 2020-09-19 NOTE — ED Notes (Signed)
MD and RN notified of Chem 8 results. Per MD we need to recollect sample. RN notified.

## 2020-09-19 NOTE — ED Notes (Signed)
Attempted bladder scan 2x. Bladder scan had a result of 0 ml both attempts.

## 2020-09-20 ENCOUNTER — Inpatient Hospital Stay (HOSPITAL_COMMUNITY)
Admission: EM | Admit: 2020-09-20 | Discharge: 2020-09-26 | DRG: 100 | Disposition: A | Discharge status: Home / Self Care | Payer: PPO | Attending: Internal Medicine | Admitting: Internal Medicine

## 2020-09-20 ENCOUNTER — Other Ambulatory Visit: Payer: Self-pay

## 2020-09-20 ENCOUNTER — Emergency Department (HOSPITAL_COMMUNITY): Payer: PPO

## 2020-09-20 DIAGNOSIS — L8990 Pressure ulcer of unspecified site, unspecified stage: Secondary | ICD-10-CM | POA: Insufficient documentation

## 2020-09-20 DIAGNOSIS — E871 Hypo-osmolality and hyponatremia: Secondary | ICD-10-CM | POA: Diagnosis present

## 2020-09-20 DIAGNOSIS — Z7401 Bed confinement status: Secondary | ICD-10-CM

## 2020-09-20 DIAGNOSIS — Z20822 Contact with and (suspected) exposure to covid-19: Secondary | ICD-10-CM | POA: Diagnosis present

## 2020-09-20 DIAGNOSIS — Z993 Dependence on wheelchair: Secondary | ICD-10-CM

## 2020-09-20 DIAGNOSIS — R569 Unspecified convulsions: Secondary | ICD-10-CM | POA: Diagnosis not present

## 2020-09-20 DIAGNOSIS — M24573 Contracture, unspecified ankle: Secondary | ICD-10-CM | POA: Diagnosis present

## 2020-09-20 DIAGNOSIS — E876 Hypokalemia: Secondary | ICD-10-CM | POA: Diagnosis present

## 2020-09-20 DIAGNOSIS — I10 Essential (primary) hypertension: Secondary | ICD-10-CM | POA: Diagnosis present

## 2020-09-20 DIAGNOSIS — G928 Other toxic encephalopathy: Secondary | ICD-10-CM | POA: Diagnosis present

## 2020-09-20 DIAGNOSIS — J9811 Atelectasis: Secondary | ICD-10-CM | POA: Diagnosis present

## 2020-09-20 DIAGNOSIS — J690 Pneumonitis due to inhalation of food and vomit: Secondary | ICD-10-CM | POA: Diagnosis present

## 2020-09-20 DIAGNOSIS — F419 Anxiety disorder, unspecified: Secondary | ICD-10-CM | POA: Diagnosis present

## 2020-09-20 DIAGNOSIS — G894 Chronic pain syndrome: Secondary | ICD-10-CM | POA: Diagnosis present

## 2020-09-20 DIAGNOSIS — B964 Proteus (mirabilis) (morganii) as the cause of diseases classified elsewhere: Secondary | ICD-10-CM | POA: Diagnosis present

## 2020-09-20 DIAGNOSIS — N390 Urinary tract infection, site not specified: Secondary | ICD-10-CM | POA: Diagnosis present

## 2020-09-20 DIAGNOSIS — R0600 Dyspnea, unspecified: Secondary | ICD-10-CM

## 2020-09-20 DIAGNOSIS — Z9359 Other cystostomy status: Secondary | ICD-10-CM

## 2020-09-20 DIAGNOSIS — N319 Neuromuscular dysfunction of bladder, unspecified: Secondary | ICD-10-CM | POA: Diagnosis present

## 2020-09-20 DIAGNOSIS — F330 Major depressive disorder, recurrent, mild: Secondary | ICD-10-CM | POA: Diagnosis present

## 2020-09-20 DIAGNOSIS — G250 Essential tremor: Secondary | ICD-10-CM | POA: Diagnosis present

## 2020-09-20 DIAGNOSIS — Z79899 Other long term (current) drug therapy: Secondary | ICD-10-CM

## 2020-09-20 DIAGNOSIS — L89152 Pressure ulcer of sacral region, stage 2: Secondary | ICD-10-CM | POA: Diagnosis present

## 2020-09-20 DIAGNOSIS — R252 Cramp and spasm: Secondary | ICD-10-CM | POA: Diagnosis present

## 2020-09-20 DIAGNOSIS — G35 Multiple sclerosis: Secondary | ICD-10-CM | POA: Diagnosis present

## 2020-09-20 DIAGNOSIS — M419 Scoliosis, unspecified: Secondary | ICD-10-CM | POA: Diagnosis present

## 2020-09-20 DIAGNOSIS — G3184 Mild cognitive impairment, so stated: Secondary | ICD-10-CM | POA: Diagnosis present

## 2020-09-20 DIAGNOSIS — Z888 Allergy status to other drugs, medicaments and biological substances status: Secondary | ICD-10-CM

## 2020-09-20 DIAGNOSIS — F919 Conduct disorder, unspecified: Secondary | ICD-10-CM | POA: Diagnosis present

## 2020-09-20 DIAGNOSIS — M24576 Contracture, unspecified foot: Secondary | ICD-10-CM | POA: Diagnosis present

## 2020-09-20 DIAGNOSIS — Z66 Do not resuscitate: Secondary | ICD-10-CM | POA: Diagnosis present

## 2020-09-20 LAB — CBC WITH DIFFERENTIAL/PLATELET
Abs Immature Granulocytes: 0.05 10*3/uL (ref 0.00–0.07)
Basophils Absolute: 0.1 10*3/uL (ref 0.0–0.1)
Basophils Relative: 1 %
Eosinophils Absolute: 0.1 10*3/uL (ref 0.0–0.5)
Eosinophils Relative: 1 %
HCT: 42.9 % (ref 36.0–46.0)
Hemoglobin: 13.1 g/dL (ref 12.0–15.0)
Immature Granulocytes: 0 %
Lymphocytes Relative: 8 %
Lymphs Abs: 1 10*3/uL (ref 0.7–4.0)
MCH: 28.9 pg (ref 26.0–34.0)
MCHC: 30.5 g/dL (ref 30.0–36.0)
MCV: 94.5 fL (ref 80.0–100.0)
Monocytes Absolute: 0.5 10*3/uL (ref 0.1–1.0)
Monocytes Relative: 5 %
Neutro Abs: 10 10*3/uL — ABNORMAL HIGH (ref 1.7–7.7)
Neutrophils Relative %: 85 %
Platelets: 321 10*3/uL (ref 150–400)
RBC: 4.54 MIL/uL (ref 3.87–5.11)
RDW: 13.2 % (ref 11.5–15.5)
WBC: 11.7 10*3/uL — ABNORMAL HIGH (ref 4.0–10.5)
nRBC: 0 % (ref 0.0–0.2)

## 2020-09-20 LAB — I-STAT CHEM 8, ED
BUN: 8 mg/dL (ref 8–23)
Calcium, Ion: 1.12 mmol/L — ABNORMAL LOW (ref 1.15–1.40)
Chloride: 92 mmol/L — ABNORMAL LOW (ref 98–111)
Creatinine, Ser: 0.4 mg/dL — ABNORMAL LOW (ref 0.44–1.00)
Glucose, Bld: 157 mg/dL — ABNORMAL HIGH (ref 70–99)
HCT: 45 % (ref 36.0–46.0)
Hemoglobin: 15.3 g/dL — ABNORMAL HIGH (ref 12.0–15.0)
Potassium: 4.7 mmol/L (ref 3.5–5.1)
Sodium: 127 mmol/L — ABNORMAL LOW (ref 135–145)
TCO2: 23 mmol/L (ref 22–32)

## 2020-09-20 LAB — I-STAT VENOUS BLOOD GAS, ED
Acid-base deficit: 3 mmol/L — ABNORMAL HIGH (ref 0.0–2.0)
Bicarbonate: 22.5 mmol/L (ref 20.0–28.0)
Calcium, Ion: 1.13 mmol/L — ABNORMAL LOW (ref 1.15–1.40)
HCT: 46 % (ref 36.0–46.0)
Hemoglobin: 15.6 g/dL — ABNORMAL HIGH (ref 12.0–15.0)
O2 Saturation: 86 %
Potassium: 4.7 mmol/L (ref 3.5–5.1)
Sodium: 128 mmol/L — ABNORMAL LOW (ref 135–145)
TCO2: 24 mmol/L (ref 22–32)
pCO2, Ven: 41.8 mmHg — ABNORMAL LOW (ref 44.0–60.0)
pH, Ven: 7.339 (ref 7.250–7.430)
pO2, Ven: 55 mmHg — ABNORMAL HIGH (ref 32.0–45.0)

## 2020-09-20 LAB — BASIC METABOLIC PANEL
Anion gap: 14 (ref 5–15)
BUN: 9 mg/dL (ref 8–23)
CO2: 21 mmol/L — ABNORMAL LOW (ref 22–32)
Calcium: 9.3 mg/dL (ref 8.9–10.3)
Chloride: 93 mmol/L — ABNORMAL LOW (ref 98–111)
Creatinine, Ser: 0.58 mg/dL (ref 0.44–1.00)
GFR, Estimated: >60 mL/min (ref >60)
Glucose, Bld: 155 mg/dL — ABNORMAL HIGH (ref 70–99)
Potassium: 4.7 mmol/L (ref 3.5–5.1)
Sodium: 128 mmol/L — ABNORMAL LOW (ref 135–145)

## 2020-09-20 LAB — MAGNESIUM: Magnesium: 1.4 mg/dL — ABNORMAL LOW (ref 1.7–2.4)

## 2020-09-20 LAB — CBG MONITORING, ED: Glucose-Capillary: 156 mg/dL — ABNORMAL HIGH (ref 70–99)

## 2020-09-20 MED ORDER — MAGNESIUM SULFATE 2 GM/50ML IV SOLN
2.0000 g | Freq: Once | INTRAVENOUS | Status: AC
  Administered 2020-09-21: 2 g via INTRAVENOUS
  Filled 2020-09-20: qty 50

## 2020-09-20 MED ORDER — LORAZEPAM 2 MG/ML IJ SOLN: 2.0000 mg | Freq: Once | INTRAMUSCULAR | Status: AC

## 2020-09-20 MED ORDER — LEVETIRACETAM IN NACL 1500 MG/100ML IV SOLN
1500.0000 mg | Freq: Once | INTRAVENOUS | Status: AC
  Administered 2020-09-20: 1500 mg via INTRAVENOUS
  Filled 2020-09-20: qty 100

## 2020-09-20 MED ORDER — LORAZEPAM 2 MG/ML IJ SOLN
INTRAMUSCULAR | Status: AC
  Administered 2020-09-20: 2 mg via INTRAVENOUS
  Filled 2020-09-20: qty 1

## 2020-09-20 NOTE — ED Triage Notes (Signed)
Pt from home BIB EMS. Hx of MS.  DNR/DNI in place.  Pt's husband noted her to have seizure like activity since about 2100.  EMS witnessed continuous seizure activity.  5 mg midazolam IM given en route.  EDP at bedside on arrival. Pt given 2 mg ativan IV.  Pt's husband updated.

## 2020-09-20 NOTE — Consult Note (Signed)
Neurology Consultation Reason for Consult: Seizure like activity in the setting of MS Requesting Physician: Zadie Rhine   CC: c/f seizures   History is obtained from: Husband at bedside, EDP and chart review   HPI: Kelly Cole is a 80 y.o. female with a past medical history significant for multiple sclerosis, hypertension, scoliosis, chronic pain, cerebellar tremor, neurogenic bladder with suprapubic catheter though she also voids.  Caveat, history was challenging as husband did seem fairly distressed and had difficulty expressing her normal baseline, which does seem to fluctuate to some degree, and expressing what was changed from baseline today.  To the best of my ability to determine, the patient converses normally at baseline.  He was unable to irrigate her suprapubic catheter yesterday and therefore brought her to the ED for evaluation.  Ultimately a suprapubic catheter exchange was required which was quite traumatic for the patient.  In the setting of significant pain she did have a brief blood pressure elevation over 200 but this resolved.  Today she seemed fairly exhausted which he attributed to the events of yesterday and he notes it is not unusual for her to sleep 11 to 12 hours.  They also employ a caregiver who helps with a substantial portion of the patient's care.  Generally throughout the day the patient has been less responsive/interactive than normal, slow to respond and with often nonspecific answers although generally seemed to make sense.  For example she was able to tell him she did not want to go to the ER again when he was encouraging her to drink more fluids.  She did not give a clear answer of what TV show they should watch together at about 8 PM.  At about 9 or 9:30 PM when he returned to check on her he found her to be nonresponsive, with roving eye movements and moving her extremities without clear purpose (he reports quite similar to my initial examination).  She then had  a 10 to 15-minute episode where her eyes rolled back and her arms were extended with bilateral hands flexed.  This lasted about 10 to 15 minutes after which she returned to her nonverbal altered state.  He called a neighbor who is a Publishing rights manager who was concerned for seizure activity.  He gave her an extra dose of BuSpar in case that might help.  She then had a second episode at which point they decided to call EMS for further evaluation.  En route EMS gave her 5 mg of Versed due to concern for continued seizure activity, and she was given an additional 2 mg of IV Ativan for roving eye movements by the EDP  Reviewed MS history based on Dr. Beather Arbour note "As a teenager, she was hospitalized for bilateral leg weakness.  They thought Polio but never had a diagnosis.  Symptoms resolved.  She was diagnosed with MS in 1970 while in [place] after experiencing numbness and tingling in her fingers.  She may have received visual evoked potentials but was primarily diagnosed based on clinical findings.  Over the next decade, she gradually had decline in gait, developing a foot drop which caused her to trip.  In the mid-late 70s, she participated in a trial where she was treated with chlorambucil and drop foot resolved.  High doses of vitamin injections (C, B1, B2, B12, etc)   Over the last 10-15 years, she has had a gradual decline in ambulation.  She progressed from cane to walker about 10 years ago.  She has been in a wheelchair for the past 6 years, following a hemicolectomy.    MRI of brain from 2019 showed chronic demyelination.  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 Unable to attend to own bodily needs without assistance, and unable to walk unassisted.     5 - Severe disability. Requires constant nursing care and attention, bedridden, incontinent.  ROS: Unable to obtain due to altered mental status.   Past Medical History:  Diagnosis Date   HTN (hypertension)    Multiple sclerosis (HCC)    Past Surgical History:  Procedure Laterality Date   ABDOMINAL HYSTERECTOMY     APPENDECTOMY     CHOLECYSTECTOMY     HYSTERCTOMY     Current Outpatient Medications  Medication Instructions   baclofen (LIORESAL) 10 mg, Oral, 2 times daily, X 2 weeks then 3x/day- for spasticity   buPROPion (ZYBAN) 450 mg, Oral, BH-each morning   busPIRone (BUSPAR) 0.5 mg, Oral, 3 times daily   cephALEXin (KEFLEX) 500 mg, Oral, 3 times daily   collagenase (SANTYL) ointment 1 application, Topical, Daily   cyanocobalamin ((VITAMIN B-12)) 1,000 mcg, Intramuscular, 3 times weekly   gabapentin (NEURONTIN) 300 mg, Oral, 2 times daily   gabapentin (NEURONTIN) 200 mg, Oral, 2 times daily   ipratropium (ATROVENT) 0.03 % nasal spray As directed   lisinopril (ZESTRIL) 40 mg, Oral, Daily   mirtazapine (REMERON) 30 mg, Oral, Daily at bedtime   propranolol (INDERAL) 30 mg, Oral, 2 times daily   propranolol ER (INDERAL LA) 80 mg, Oral, Daily   QUEtiapine (SEROQUEL) 100 mg, Oral, Daily at bedtime   temazepam (RESTORIL) 30 mg, Oral, Daily at bedtime   tretinoin (RETIN-A) 0.025 % cream APP EXT TO FACE QD IN THE EVE   venlafaxine XR (EFFEXOR-XR) 300 mg, Oral, BH-each morning  -Needs to be updated, not yet reviewed by pharmacy tech and husband notes for example that the gabapentin has been stopped   No family history on file. --Cousin's daughter has multiple sclerosis as well per outpatient neurology notes  Social History:   reports that she has never smoked. She has never used smokeless tobacco. She reports current alcohol use. She reports that she does not use drugs. Has been reports he brings a glass of wine nightly  Exam: Current vital signs: BP 138/73   Pulse 95   Temp 98 F (36.7 C)   Resp 18   Ht 5\' 4"  (1.626 m)   Wt 59 kg   SpO2 100%   BMI 22.31 kg/m  Vital signs in last 24 hours: Temp:  [98 F (36.7 C)] 98 F (36.7 C) (08/13 2308) Pulse Rate:  [95-103] 95 (08/13 2330) Resp:  [18] 18 (08/13 2300) BP: (132-138)/(73-112) 138/73 (08/13 2330) SpO2:  [99 %-100 %] 100 % (08/13 2330) Weight:  [59 kg] 59 kg (08/13 2308)   Physical Exam  Constitutional: Appears chronically ill  Psych: Minimally interactive Eyes: No scleral injection HENT: No oropharyngeal obstruction.  MSK: no joint deformities.  Cardiovascular: Mildly  tachycardic, regular rhythm Respiratory: Effort normal, non-labored breathing GI: Soft.  No distension. There is no tenderness.  Skin: Warm dry and intact visible skin  Neuro: Mental Status: Patient is obtunded.  With noxious stimulation she localizes well with the left upper extremity and looks towards the right Cranial Nerves: II: Pupils are equal, round, and reactive to light, left eye slightly more briskly than the right eye (chronic) III,IV, VI: EOMI with roving eye movements.  With noxious stimulation initially would tend to look more to the right with right gaze and right head turn.  On later evaluation was turning head bilaterally V: Facial sensation is symmetric to eyelash brush VII: Facial movement is symmetric grimace VIII: hearing is intact to voice, does seem to respond slightly to voice Remainder unable to assess secondary to patient's mental status  Motor: Bilateral lower extremity contractures.  Right hand contracture.  Moving left arm more than right arm, baseline secondary to her multiple sclerosis Sensory: Appears equally reactive to touch in all 4  extremities Deep Tendon Reflexes: 3+ and symmetric in the brachioradialis Plantars: Toes are mute bilaterally.  Cerebellar: Unable to assess secondary to patient's mental status Gait: Not tested as patient is wheelchair-bound at baseline   I have reviewed labs in epic and the results pertinent to this consultation are:  Glucose 155, sodium 128 (139 -> 131 yesterday), chloride 93 (113 -> 94 yesterday) magnesium low at 1.4, bicarb low at 21 New leukocytosis from 7.3 to 11.7 UA 8/12 with large hemoglobin, small leukocytes, rare bacteria, 100 protein, hyaline casts, mucus  I have reviewed the images obtained: HCT no prior for comparison, no acute intracranial process on my read though there is significant white matter change likely related to her multiple sclerosis as well as chronic left caudate stroke  Chest x-ray personally reviewed, read by radiology as mild left basilar atelectasis; on my review there does seem to be some opacification in the right lung as well  Impression: This is a 80 year old woman with a past medical history significant for multiple sclerosis, suprapubic catheter recently exchanged, scoliosis, chronic pain, chronic benzodiazepine use, presenting with seizure-like activity and altered mental status.  Based on the description of the activity, seizure is certainly on the differential, although there is a substantial toxic/metabolic encephalopathy.  Husband does note she has had delirium in the setting of medication use in the past, and her atrophy does put her at risk of delirium.  Suspect reduced seizure threshold in the setting of infection and metabolic derangements.  Currently I have low concern for CNS infection given likely source of either urine given recent suprapubic catheter exchange versus pulmonary given chest x-ray findings  Recommendations: -Agree with Keppra 1500 mg loading dose in the ED -Continue Keppra 500 mg twice daily -Routine EEG -Continue home  baclofen and temazepam when able to tolerate p.o., if she is unable to start getting these medications tomorrow she will be at high risk of withdrawal seizures -MRI brain with and without contrast when mental status allows; currently she is too restless for the scan -Appreciate infectious work-up per primary team -Replete electrolytes aggressively, phosphorus level also sent -Neurology will continue to follow  Brooke Dare MD-PhD [facility] [phone]

## 2020-09-20 NOTE — ED Provider Notes (Signed)
[facility] EMERGENCY DEPARTMENT Provider Note   CSN: [id number] Arrival date & time: 09/20/20  2252     History Chief Complaint  Patient presents with   Seizures   Level 5 caveat due to altered mental status Kelly Cole is a 80 y.o. female.  The history is provided by the EMS personnel and the spouse.  Seizures Severity:  Severe Duration:  2 hours Context comment:  History of multiple sclerosis History of seizures: no      Patient with history of hypertension, multiple sclerosis presents possible seizure activity.  Husband reports the patient had a seizure activity around 2100.  She has no known history of seizures. 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     No family history on file.  Social History   Tobacco Use   Smoking status: Never   Smokeless tobacco: Never  Vaping Use   Vaping Use: Never used  Substance Use Topics   Alcohol use: Yes   Drug use: Never    Home Medications Prior to Admission medications   Medication Sig Start Date End Date Taking? Authorizing Provider  baclofen  (LIORESAL) 10 MG tablet Take 1 tablet (10 mg total) by mouth 2 (two) times daily. X 2 weeks then 3x/day- for spasticity 06/18/19   Lovorn, Aundra Millet, MD  buPROPion (ZYBAN) 150 MG 12 hr tablet Take 450 mg by mouth every morning. 09/13/18   [provider]  busPIRone (BUSPAR) 10 MG tablet Take 0.5 mg by mouth 3 (three) times daily.    [provider]  cephALEXin (KEFLEX) 500 MG capsule Take 1 capsule (500 mg total) by mouth 3 (three) times daily. 07/13/19   Linwood Dibbles, MD  collagenase (SANTYL) ointment Apply 1 application topically daily.    [provider]  cyanocobalamin (,VITAMIN B-12,) 1000 MCG/ML injection Inject 1,000 mcg into the muscle 3 (three) times a week. 09/06/18   [provider]  gabapentin (NEURONTIN) 100 MG capsule Take 200 mg by mouth 2 (two) times daily. 12/05/19   [provider]  gabapentin (NEURONTIN) 300 MG capsule Take 300 mg by mouth 2 (two) times daily.    [provider]  ipratropium (ATROVENT) 0.03 % nasal spray As directed 06/01/19   [provider]  lisinopril (ZESTRIL) 40 MG tablet Take 40 mg by mouth daily. 08/14/18   [provider]  mirtazapine (REMERON) 30 MG tablet Take 30 mg by mouth at bedtime. 09/13/18   [provider]  propranolol (INDERAL) 10 MG tablet Take 3 tablets (30 mg total) by mouth 2 (two) times daily. Patient not taking: Reported on 12/13/2019 10/10/19  Everlena CooperJaffe, Adam R, DO  propranolol ER (INDERAL LA) 80 MG 24 hr capsule Take 80 mg by mouth daily. 12/05/19   [provider]  QUEtiapine (SEROQUEL) 100 MG tablet Take 100 mg by mouth at bedtime. 09/13/18   [provider]  temazepam (RESTORIL) 15 MG capsule Take 30 mg by mouth at bedtime. 09/21/18   [provider]  tretinoin (RETIN-A) 0.025 % cream APP EXT TO FACE QD IN THE EVE 09/04/18   [provider]  venlafaxine XR (EFFEXOR-XR) 150 MG 24 hr capsule Take 300 mg by mouth every morning. 09/13/18   [provider]    Allergies    Demerol  [meperidine]  Review of Systems   Review of Systems  Unable to perform ROS: Mental status change  Neurological:  Positive for seizures.   Physical Exam Updated Vital Signs BP 131/62   Pulse 92   Temp 98.9 F (37.2 C) (Rectal)   Resp 18   Ht 1.626 m (5\' 4" )   Wt 59 kg   SpO2 100%   BMI 22.31 kg/m   Physical Exam CONSTITUTIONAL: Chronically ill-appearing, mildly agitated HEAD: Normocephalic/atraumatic, no visible trauma EYES: EOMI/PERRL, nystagmus noted ENMT: Mucous membranes moist NECK: supple no meningeal signs CV: S1/S2 noted, no murmurs/rubs/gallops noted LUNGS: Lungs are clear to auscultation bilaterally, no apparent distress ABDOMEN: soft, nondistended, suprapubic catheter in place GU:no cva tenderness NEURO: Pt is awake but not following commands.  She will have intermittent jerking movement of her left arm. EXTREMITIES: pulses normal/equal, no deformities, contractures noted SKIN: warm, color normal PSYCH: Unable to assess ED Results / Procedures / Treatments   Labs (all labs ordered are listed, but only abnormal results are displayed) Labs Reviewed  BASIC METABOLIC PANEL - Abnormal; Notable for the following components:      Result Value   Sodium 128 (*)    Chloride 93 (*)    CO2 21 (*)    Glucose, Bld 155 (*)    All other components within normal limits  CBC WITH DIFFERENTIAL/PLATELET - Abnormal; Notable for the following components:   WBC 11.7 (*)    Neutro Abs 10.0 (*)    All other components within normal limits  MAGNESIUM - Abnormal; Notable for the following components:   Magnesium 1.4 (*)    All other components within normal limits  I-STAT VENOUS BLOOD GAS, ED - Abnormal; Notable for the following components:   pCO2, Ven 41.8 (*)    pO2, Ven 55.0 (*)    Acid-base deficit 3.0 (*)    Sodium 128 (*)    Calcium, Ion 1.13 (*)    Hemoglobin 15.6 (*)    All other components within normal limits  I-STAT  CHEM 8, ED - Abnormal; Notable for the following components:   Sodium 127 (*)    Chloride 92 (*)    Creatinine, Ser 0.40 (*)    Glucose, Bld 157 (*)    Calcium, Ion 1.12 (*)    Hemoglobin 15.3 (*)    All other components within normal limits  CBG MONITORING, ED - Abnormal; Notable for the following components:   Glucose-Capillary 156 (*)    All other components within normal limits  RESP PANEL BY RT-PCR (FLU A&B, COVID) ARPGX2  PHOSPHORUS  URINALYSIS, ROUTINE W REFLEX MICROSCOPIC    EKG EKG Interpretation  Date/Time:  Saturday September 20 2020 22:58:36 EDT Ventricular Rate:  99 PR Interval:  208 QRS Duration: 94 QT Interval:  356 QTC Calculation: 457 R Axis:  50 Text Interpretation: Sinus rhythm Borderline prolonged PR interval Probable left atrial enlargement Abnormal T, consider ischemia, lateral leads Artifact in lead(s) I II III aVR aVL aVF V1 V2 V3 V4 V5 V6 Interpretation limited secondary to artifact Confirmed by Zadie Rhine ([id number]) on 09/20/2020 11:15:12 PM  Radiology CT HEAD WO CONTRAST ( )  Result Date: 09/21/2020 CLINICAL DATA:  Delirium EXAM: CT HEAD WITHOUT CONTRAST TECHNIQUE: Contiguous axial images were obtained from the base of the skull through the vertex without intravenous contrast. COMPARISON:  None. FINDINGS: Brain: No evidence of acute infarction, hemorrhage, hydrocephalus, extra-axial collection or mass lesion/mass effect. Global cortical and central atrophy.  Secondary ventriculomegaly. Subcortical white matter and periventricular small vessel ischemic changes. Old left basal ganglia lacunar infarct. Vascular: Intracranial atherosclerosis. Skull: Normal. Negative for fracture or focal lesion. Sinuses/Orbits: The visualized paranasal sinuses are essentially clear. The mastoid air cells are unopacified. Other: None. IMPRESSION: No evidence of acute intracranial abnormality. Left basal ganglia lacunar infarct. Atrophy with small vessel ischemic changes.  Electronically Signed   By: Charline Bills M.D.   On: 09/21/2020 00:13   DG Chest Portable 1 View  Result Date: 09/20/2020 CLINICAL DATA:  Altered mental status and recent seizure activity EXAM: PORTABLE CHEST 1 VIEW COMPARISON:  07/13/2018 FINDINGS: Cardiac shadow is within normal limits. Aortic calcifications are seen. Mild left basilar atelectasis is noted. No sizable effusion is noted. No bony abnormality is seen. IMPRESSION: Mild left basilar atelectasis. Electronically Signed   By: Alcide Clever M.D.   On: 09/20/2020 23:13    Procedures .Critical Care  Date/Time: 09/20/2020 11:28 PM Performed by: Zadie Rhine, MD Authorized by: Zadie Rhine, MD   Critical care provider statement:    Critical care time (minutes):  42   Critical care start time:  09/20/2020 11:28 PM   Critical care end time:  09/20/2020 12:10 AM   Critical care time was exclusive of:  Separately billable procedures and treating other patients   Critical care was necessary to treat or prevent imminent or life-threatening deterioration of the following conditions:  CNS failure or compromise and dehydration   Critical care was time spent personally by me on the following activities:  Discussions with consultants, development of treatment plan with patient or surrogate, ordering and review of radiographic studies, ordering and review of laboratory studies, ordering and performing treatments and interventions, re-evaluation of patient's condition, pulse oximetry, review of old charts, evaluation of patient's response to treatment and examination of patient   I assumed direction of critical care for this patient from another provider in my specialty: no     Care discussed with: admitting provider     Medications Ordered in ED Medications  magnesium sulfate IVPB 2 g 50 mL (2 g Intravenous New Bag/Given 09/21/20 0034)  LORazepam (ATIVAN) injection 2 mg (2 mg Intravenous Given 09/20/20 2300)  levETIRAcetam (KEPPRA) IVPB  1500 mg/ 100 mL premix (0 mg Intravenous Stopped 09/20/20 2335)    ED Course  I have reviewed the triage vital signs and the nursing notes.  Pertinent labs & imaging results that were available during my care of the patient were reviewed by me and considered in my medical decision making (see chart for details).    MDM Rules/Calculators/A&P                           Patient with significant history of multiple sclerosis presenting for concern for seizure activity.  She had already received Versed and  Ativan by time of my evaluation Seen at bedside with neurologist Dr. Iver Nestle She does not feel that this is status epilepticus.  She recommends continuing Keppra, and obtain CT head 12:53 AM I discussed x-ray findings with the radiologist. Dr. Karle Starch  does not feel this represents pneumonia Discussed with Dr. Iver Nestle with neurology. Recommends continue Keppra.  CT head was reviewed and there is no acute findings. Patient may need further neuroimaging when she is more alert.  Patient found to have hypomagnesemia magnesium has been ordered.  Labs overall near baseline.  She is afebrile.  Patient has been stabilized in the emergency department.  Discussed with hospitalist for admission Final Clinical Impression(s) / ED Diagnoses Final diagnoses:  Seizure-like activity (HCC)  Hypomagnesemia    Rx / DC Orders ED Discharge Orders     None        Zadie Rhine, MD 09/21/20 [id number]

## 2020-09-21 ENCOUNTER — Observation Stay (HOSPITAL_COMMUNITY): Payer: PPO

## 2020-09-21 DIAGNOSIS — G250 Essential tremor: Secondary | ICD-10-CM | POA: Diagnosis present

## 2020-09-21 DIAGNOSIS — G894 Chronic pain syndrome: Secondary | ICD-10-CM | POA: Diagnosis present

## 2020-09-21 DIAGNOSIS — Z79899 Other long term (current) drug therapy: Secondary | ICD-10-CM | POA: Diagnosis not present

## 2020-09-21 DIAGNOSIS — G4089 Other seizures: Secondary | ICD-10-CM

## 2020-09-21 DIAGNOSIS — E876 Hypokalemia: Secondary | ICD-10-CM | POA: Diagnosis present

## 2020-09-21 DIAGNOSIS — F330 Major depressive disorder, recurrent, mild: Secondary | ICD-10-CM | POA: Diagnosis present

## 2020-09-21 DIAGNOSIS — Z20822 Contact with and (suspected) exposure to covid-19: Secondary | ICD-10-CM | POA: Diagnosis present

## 2020-09-21 DIAGNOSIS — R569 Unspecified convulsions: Secondary | ICD-10-CM | POA: Diagnosis present

## 2020-09-21 DIAGNOSIS — Z9359 Other cystostomy status: Secondary | ICD-10-CM

## 2020-09-21 DIAGNOSIS — R252 Cramp and spasm: Secondary | ICD-10-CM

## 2020-09-21 DIAGNOSIS — G928 Other toxic encephalopathy: Secondary | ICD-10-CM | POA: Diagnosis present

## 2020-09-21 DIAGNOSIS — I10 Essential (primary) hypertension: Secondary | ICD-10-CM | POA: Diagnosis present

## 2020-09-21 DIAGNOSIS — F919 Conduct disorder, unspecified: Secondary | ICD-10-CM | POA: Diagnosis present

## 2020-09-21 DIAGNOSIS — J690 Pneumonitis due to inhalation of food and vomit: Secondary | ICD-10-CM | POA: Diagnosis present

## 2020-09-21 DIAGNOSIS — B964 Proteus (mirabilis) (morganii) as the cause of diseases classified elsewhere: Secondary | ICD-10-CM | POA: Diagnosis present

## 2020-09-21 DIAGNOSIS — Z888 Allergy status to other drugs, medicaments and biological substances status: Secondary | ICD-10-CM | POA: Diagnosis not present

## 2020-09-21 DIAGNOSIS — E871 Hypo-osmolality and hyponatremia: Secondary | ICD-10-CM | POA: Diagnosis present

## 2020-09-21 DIAGNOSIS — M24573 Contracture, unspecified ankle: Secondary | ICD-10-CM | POA: Diagnosis not present

## 2020-09-21 DIAGNOSIS — G35 Multiple sclerosis: Secondary | ICD-10-CM | POA: Diagnosis present

## 2020-09-21 DIAGNOSIS — G3184 Mild cognitive impairment, so stated: Secondary | ICD-10-CM | POA: Diagnosis present

## 2020-09-21 DIAGNOSIS — Z7401 Bed confinement status: Secondary | ICD-10-CM | POA: Diagnosis not present

## 2020-09-21 DIAGNOSIS — Z993 Dependence on wheelchair: Secondary | ICD-10-CM | POA: Diagnosis not present

## 2020-09-21 DIAGNOSIS — F419 Anxiety disorder, unspecified: Secondary | ICD-10-CM | POA: Diagnosis present

## 2020-09-21 DIAGNOSIS — Z66 Do not resuscitate: Secondary | ICD-10-CM | POA: Diagnosis present

## 2020-09-21 DIAGNOSIS — N390 Urinary tract infection, site not specified: Secondary | ICD-10-CM | POA: Diagnosis present

## 2020-09-21 DIAGNOSIS — N319 Neuromuscular dysfunction of bladder, unspecified: Secondary | ICD-10-CM | POA: Diagnosis present

## 2020-09-21 DIAGNOSIS — J9811 Atelectasis: Secondary | ICD-10-CM | POA: Diagnosis present

## 2020-09-21 LAB — COMPREHENSIVE METABOLIC PANEL
ALT: 13 U/L (ref 0–44)
AST: 17 U/L (ref 15–41)
Albumin: 2.9 g/dL — ABNORMAL LOW (ref 3.5–5.0)
Alkaline Phosphatase: 43 U/L (ref 38–126)
Anion gap: 7 (ref 5–15)
BUN: 6 mg/dL — ABNORMAL LOW (ref 8–23)
CO2: 23 mmol/L (ref 22–32)
Calcium: 7.7 mg/dL — ABNORMAL LOW (ref 8.9–10.3)
Chloride: 100 mmol/L (ref 98–111)
Creatinine, Ser: 0.38 mg/dL — ABNORMAL LOW (ref 0.44–1.00)
GFR, Estimated: >60 mL/min (ref >60)
Glucose, Bld: 106 mg/dL — ABNORMAL HIGH (ref 70–99)
Potassium: 3.3 mmol/L — ABNORMAL LOW (ref 3.5–5.1)
Sodium: 130 mmol/L — ABNORMAL LOW (ref 135–145)
Total Bilirubin: 0.8 mg/dL (ref 0.3–1.2)
Total Protein: 4.8 g/dL — ABNORMAL LOW (ref 6.5–8.1)

## 2020-09-21 LAB — URINALYSIS, ROUTINE W REFLEX MICROSCOPIC
Bilirubin Urine: NEGATIVE
Glucose, UA: 150 mg/dL — AB
Hgb urine dipstick: NEGATIVE
Ketones, ur: 5 mg/dL — AB
Nitrite: POSITIVE — AB
Protein, ur: 30 mg/dL — AB
Specific Gravity, Urine: 1.016 (ref 1.005–1.030)
pH: 7 (ref 5.0–8.0)

## 2020-09-21 LAB — CBC
HCT: 33.8 % — ABNORMAL LOW (ref 36.0–46.0)
Hemoglobin: 10.6 g/dL — ABNORMAL LOW (ref 12.0–15.0)
MCH: 29.1 pg (ref 26.0–34.0)
MCHC: 31.4 g/dL (ref 30.0–36.0)
MCV: 92.9 fL (ref 80.0–100.0)
Platelets: 209 10*3/uL (ref 150–400)
RBC: 3.64 MIL/uL — ABNORMAL LOW (ref 3.87–5.11)
RDW: 13 % (ref 11.5–15.5)
WBC: 12.8 10*3/uL — ABNORMAL HIGH (ref 4.0–10.5)
nRBC: 0 % (ref 0.0–0.2)

## 2020-09-21 LAB — RESP PANEL BY RT-PCR (FLU A&B, COVID) ARPGX2
Influenza A by PCR: NEGATIVE
Influenza B by PCR: NEGATIVE
SARS Coronavirus 2 by RT PCR: NEGATIVE

## 2020-09-21 LAB — MAGNESIUM: Magnesium: 1.6 mg/dL — ABNORMAL LOW (ref 1.7–2.4)

## 2020-09-21 LAB — PROCALCITONIN: Procalcitonin: <0.1 ng/mL

## 2020-09-21 MED ORDER — ACETAMINOPHEN 650 MG RE SUPP
650.0000 mg | Freq: Four times a day (QID) | RECTAL | Status: DC | PRN
  Administered 2020-09-21 – 2020-09-22 (×2): 650 mg via RECTAL
  Filled 2020-09-21 (×2): qty 1

## 2020-09-21 MED ORDER — ACETAMINOPHEN 325 MG PO TABS: 650.0000 mg | ORAL_TABLET | Freq: Four times a day (QID) | ORAL | Status: DC | PRN

## 2020-09-21 MED ORDER — SODIUM CHLORIDE 0.9% FLUSH
3.0000 mL | Freq: Two times a day (BID) | INTRAVENOUS | Status: DC
  Administered 2020-09-21 – 2020-09-26 (×8): 3 mL via INTRAVENOUS

## 2020-09-21 MED ORDER — ENOXAPARIN SODIUM 30 MG/0.3ML IJ SOSY
30.0000 mg | PREFILLED_SYRINGE | INTRAMUSCULAR | Status: DC
  Administered 2020-09-21 – 2020-09-22 (×2): 30 mg via SUBCUTANEOUS
  Filled 2020-09-21 (×2): qty 0.3

## 2020-09-21 MED ORDER — SODIUM CHLORIDE 0.9 % IV SOLN
1.0000 g | INTRAVENOUS | Status: DC
  Administered 2020-09-21 – 2020-09-25 (×5): 1 g via INTRAVENOUS
  Filled 2020-09-21 (×5): qty 10

## 2020-09-21 MED ORDER — TEMAZEPAM 15 MG PO CAPS
30.0000 mg | ORAL_CAPSULE | Freq: Every day | ORAL | Status: DC
  Administered 2020-09-22 – 2020-09-25 (×4): 30 mg via ORAL
  Filled 2020-09-21 (×4): qty 2

## 2020-09-21 MED ORDER — LEVETIRACETAM IN NACL 500 MG/100ML IV SOLN
500.0000 mg | Freq: Two times a day (BID) | INTRAVENOUS | Status: AC
Start: 2020-09-21 — End: 2020-09-23
  Administered 2020-09-21 – 2020-09-23 (×6): 500 mg via INTRAVENOUS
  Filled 2020-09-21 (×6): qty 100

## 2020-09-21 MED ORDER — POLYETHYLENE GLYCOL 3350 17 G PO PACK: 17.0000 g | PACK | Freq: Every day | ORAL | Status: DC | PRN

## 2020-09-21 MED ORDER — SODIUM CHLORIDE 0.9 % IV SOLN
500.0000 mg | INTRAVENOUS | Status: DC
  Administered 2020-09-21 – 2020-09-25 (×5): 500 mg via INTRAVENOUS
  Filled 2020-09-21 (×6): qty 500

## 2020-09-21 MED ORDER — MAGNESIUM SULFATE 4 GM/100ML IV SOLN
4.0000 g | Freq: Once | INTRAVENOUS | Status: AC
  Administered 2020-09-21: 4 g via INTRAVENOUS
  Filled 2020-09-21: qty 100

## 2020-09-21 MED ORDER — LORAZEPAM 2 MG/ML IJ SOLN
2.0000 mg | INTRAMUSCULAR | Status: AC | PRN
  Administered 2020-09-21: 2 mg via INTRAVENOUS
  Filled 2020-09-21: qty 1

## 2020-09-21 MED ORDER — BACLOFEN 10 MG PO TABS
10.0000 mg | ORAL_TABLET | Freq: Two times a day (BID) | ORAL | Status: DC
  Filled 2020-09-21 (×4): qty 1

## 2020-09-21 MED ORDER — LORAZEPAM 2 MG/ML IJ SOLN
1.0000 mg | INTRAMUSCULAR | Status: DC | PRN
  Administered 2020-09-21 – 2020-09-24 (×6): 1 mg via INTRAVENOUS
  Filled 2020-09-21 (×6): qty 1

## 2020-09-21 MED ORDER — LORAZEPAM 2 MG/ML IJ SOLN: 2.0000 mg | INTRAMUSCULAR | Status: DC | PRN

## 2020-09-21 MED ORDER — SODIUM CHLORIDE 0.9 % IV SOLN: INTRAVENOUS | Status: AC

## 2020-09-21 NOTE — ED Notes (Signed)
Notified Dr. Denton Lank that MRI is unable to be completed at this time due to patient agitation along with needing to complete further history from family to clear her for the MRI since patient is unable to answer questions at this time.

## 2020-09-21 NOTE — ED Notes (Signed)
Per report received by Luanna Salk RN, patient very agitated, moving around in bed continuously, pulling off wires and not leaving telemetry monitoring on. Found same when rounding on patient. Educated patient on importance of monitoring equipment. Pt also found to be completely naked refusing to have a gown placed on. Will continue to monitor the situation.

## 2020-09-21 NOTE — ED Notes (Signed)
Patient would not stay still for blood draw.

## 2020-09-21 NOTE — Progress Notes (Addendum)
+   PROGRESS NOTE    Kelly Cole  GDJ:[id number] DOB: 1940/08/09 DOA: 09/20/2020  PCP: Merlene Laughter, MD    LOS - 0    Patient admitted after midnight with seizure activity. Hx of MS with neurogenic bladder, wheel-chair bound, from home with husband and caregiver assistance.   Neurology consulted.  Pt loaded with Keppra and to continue at 500 mg BID.  EEG and MRI pending.  Also with multiple electrolyte derangements, being replaced and closely monitored.   Interval subjective: Pt seen in ED holding for a bed.  She appears uncomfortable, moaning and appears restless.  She is not able to verbally express her complaints other than to say that she is cold.  Exam: Awake and alert, appears in mild distress/uncomfortable. Awake and alert, unable to assess orientation as patient does not answer to questions. Abdomen soft and not distended, no significant tenderness Suprapubic catheter in place and the site appears without surrounding erythema swelling induration or other signs of infection. Spastic extremities, no peripheral edema   Principal Problem:   Seizure (HCC) Active Problems:   Multiple sclerosis (HCC)   Scoliosis   Spasticity   Contracture of ankle and foot joint, unspecified laterality   Essential hypertension   Essential tremor   Mild recurrent major depression (HCC)   Suprapubic catheter (HCC)    I have reviewed the full H&P by Dr. Alinda Money in detail, and I agree with the assessment and plan as outlined therein. In addition: --Start Rocephin empirically for UTI --Also start Zithromax empirically for CAP coverage and she does appear to have a left base infiltrate versus atelectasis --Case discussed with neurologist --Continue Keppra --Starting on Ativan as patient is on benzos at home and currently unable to take p.o. safely --Speech evaluation due to concern for possible aspiration based on chest x-ray finding --Patient was not able to tolerate MRI and is extremely  difficult position for proper scan given her spasticity; she cannot lay still currently.  MRI felt to be of very low yield so will defer at this time --EEG pending, follow-up --Change inpatient status due to severity of illness patient requiring IV medications and further diagnostic evaluation --Palliative care consulted for goals of care discussion   Updated husband by phone this afternoon on all of above, plan and questions answered.   Time spent: 30 minutes with greater than 50% spent in coordination of care and at bedside    Pennie Banter, DO [facility]   If 7PM-7AM, please contact night-coverage [email or web address] 09/21/2020, 7:47 AM

## 2020-09-21 NOTE — Progress Notes (Signed)
EEG Completed; Results Pending  

## 2020-09-21 NOTE — Procedures (Signed)
Patient Name: Kelly Cole  MRN: [id number]  Epilepsy Attending: Charlsie Quest  Referring Physician/Provider: Dr Brooke Dare Date: 09/21/2020 Duration: 22.02 mins  Patient history: 80 year old woman with a past medical history significant for multiple sclerosis, suprapubic catheter recently exchanged, scoliosis, chronic pain, chronic benzodiazepine use, presenting with seizure-like activity and altered mental status. EEG to evaluate for seizure  Level of alertness: Awake  AEDs during EEG study: LEV  Technical aspects: This EEG study was done with scalp electrodes positioned according to the 10-20 International system of electrode placement. Electrical activity was acquired at a sampling rate of 500Hz  and reviewed with a high frequency filter of 70Hz  and a low frequency filter of 1Hz . EEG data were recorded continuously and digitally stored.   Description: No posterior dominant rhythm was seen.  EEG showed continuous generalized 3 to 6 Hz theta-delta slowing. Hyperventilation and photic stimulation were not performed.    Of note, study was technically difficult due to significant electrode artifact.   ABNORMALITY - Continuous slow, generalized  IMPRESSION: This technically difficult study is suggestive of moderate diffuse encephalopathy, nonspecific etiology. No seizures or epileptiform discharges were seen throughout the recording.  Kelly Cole 

## 2020-09-21 NOTE — Progress Notes (Addendum)
Neurology Progress Note  Brief HPI: 80 y.o. female with PMHx of MS, HTN, scoliosis, chronic pain, cerebellar tumor, neurogenic bladder with suprapubic catheter, though she also voids with spurapubic catheter exchange 09/19/2020 who presented to the ED for evaluation of AMS after husband found her unresponsive with roving eye movements and restless movements of her extremities without clear purpose with a witnessed episode lasting 10-15 minutes of her eyes rolling back with extended arms and bilateral hand flexion with subsequent nonverbal and altered state. After a second witnessed episode, EMS was activated due to concern for seizures.   Subjective: No acute overnight events Patient unable to tolerate MRI attempt due to persistent restlessness Patient remains altered at this time and has not returned to her baseline mental status  Exam: Vitals:   09/21/20 0815 09/21/20 0900  BP: (!) 129/47 (!) 134/106  Pulse: 84 99  Resp:  18  Temp:    SpO2: 98% 100%   Gen: Chronically ill appearing female sitting in ER stretcher with restless movements that are not rhythmic in appearance Resp: non-labored breathing on room air, SpO2 98% Abd: soft, non-distended  Neuro: Mental Status:  Patient is somnolent with restless movements of her extremities (reported chronic tremors) She is able to localize with the left upper extremity.  She does not produce usable speech but moans consistently at rest and slightly increased with various stimulation. Does not answer examiner questions.  Opens eyes intermittently but does not fixate or track.  Eye movements are roving. Cranial Nerves: II: Pupils are equal, round, and reactive to light, left eye slightly more briskly than the right eye (chronic) III,IV, VI: EOMI with roving eye movements.  V: Facial sensation is symmetric to eyelash brush VII: Facial movement is symmetric to grimace VIII: Hearing is intact to voice, does seem to respond slightly to voice with  increasing restless movements and increasing moaning/vocalization  Remainder unable to assess secondary to patient's mental status  Motor: Bilateral lower extremity contractures. Right hand contracture.  Moving left arm more than right arm, baseline secondary to her multiple sclerosis. Sensory: Grimaces equally with light application of noxious stimuli throughout with increasing restless movements DTR: 3+ and symmetric in the brachioradialis and patellae Plantars: Toes are mute bilaterally  Gait: Deferred; patient is wheelchair-bound at baseline  Pertinent Labs: CBC    Component Value Date/Time   WBC 12.8 (H) 09/21/2020 0403   RBC 3.64 (L) 09/21/2020 0403   HGB 10.6 (L) 09/21/2020 0403   HCT 33.8 (L) 09/21/2020 0403   PLT 209 09/21/2020 0403   MCV 92.9 09/21/2020 0403   MCH 29.1 09/21/2020 0403   MCHC 31.4 09/21/2020 0403   RDW 13.0 09/21/2020 0403   LYMPHSABS 1.0 09/20/2020 2250   MONOABS 0.5 09/20/2020 2250   EOSABS 0.1 09/20/2020 2250   BASOSABS 0.1 09/20/2020 2250   CMP     Component Value Date/Time   NA 130 (L) 09/21/2020 0403   K 3.3 (L) 09/21/2020 0403   CL 100 09/21/2020 0403   CO2 23 09/21/2020 0403   GLUCOSE 106 (H) 09/21/2020 0403   BUN 6 (L) 09/21/2020 0403   CREATININE 0.38 (L) 09/21/2020 0403   CALCIUM 7.7 (L) 09/21/2020 0403   PROT 4.8 (L) 09/21/2020 0403   ALBUMIN 2.9 (L) 09/21/2020 0403   AST 17 09/21/2020 0403   ALT 13 09/21/2020 0403   ALKPHOS 43 09/21/2020 0403   BILITOT 0.8 09/21/2020 0403   GFRNONAA >60 09/21/2020 0403   GFRAA 52 (L) 07/13/2019 1832  Urinalysis    Component Value Date/Time   COLORURINE YELLOW 09/21/2020 0403   APPEARANCEUR CLOUDY (A) 09/21/2020 0403   LABSPEC 1.016 09/21/2020 0403   PHURINE 7.0 09/21/2020 0403   GLUCOSEU 150 (A) 09/21/2020 0403   HGBUR NEGATIVE 09/21/2020 0403   BILIRUBINUR NEGATIVE 09/21/2020 0403   KETONESUR 5 (A) 09/21/2020 0403   PROTEINUR 30 (A) 09/21/2020 0403   NITRITE POSITIVE (A) 09/21/2020  0403   LEUKOCYTESUR MODERATE (A) 09/21/2020 0403   Magnesium 1.4  Imaging Reviewed:  CTH without contrast 09/20/2020: No evidence of acute intracranial abnormality. Left basal ganglia lacunar infarct. Atrophy with small vessel ischemic changes.  Assessment: 80 year old woman with a PMH significant for primary progressive multiple sclerosis, suprapubic catheter recently exchanged, scoliosis, chronic pain, chronic benzodiazepine use, presenting with seizure-like activity and AMS.  Based on the description of the activity, seizure is certainly on the differential, although there is a substantial toxic/metabolic encephalopathy.  Husband does note she has had delirium in the setting of medication use in the past, and her atrophy does put her at risk of delirium.  Suspect reduced seizure threshold in the setting of infection and metabolic derangements.  Currently I have low concern for CNS infection given likely source of either urine given recent suprapubic catheter exchange with UA evidence of infection + nitrite, moderate leukocyte, and the presence of bacteria versus pulmonary given chest x-ray findings of possible opacification in the right lung.  IMPRESSION AMS-likely toxic metabolic encephalopathy Evaluate for UTI Evaluate for pneumonia Less likely this is an MS relapse that she has primary progressive subtype according to the husband.  Recommendations: -EEG ordered, pending -MRI brain with and without contrast when able to tolerate; currently remains too restless for imaging and I am not really sure if an MRI is going to make any big difference in the big picture. -Replete and trend electrolyte derangements; hypomagnesemia, phosphorous level pending -Continue Keppra 500 mg BID for now -Continue home baclofen and temazepam when able to tolerate p.o., if she is unable to start getting these medications today she will be at high risk of withdrawal seizures. Since she is still NPO - will add  Ativan 0.5mg  IV q6h (equivalent to a 30mg  dose of Temazepam) to avoid benzo related withdrawal -Appreciate infectious work up and management per primary team-attending spoke with Dr. for treatment of possible UTI and maybe also the pneumonia. -Recommend wound care consult for evaluation of existing wounds    -Neurology will continue to follow   Denton Lank, AGACNP-BC [facility] [phone]  Attending Neurohospitalist Addendum Patient seen and examined with APP/Resident. Agree with the history and physical as documented above. Agree with the plan as documented, which I helped formulate. I have independently reviewed the chart, obtained history, review of systems and examined the patient.I have personally reviewed pertinent head/neck/spine imaging (CT/MRI).  After my visit with the patient, was able to pull charts from [facility] where she used to get her care when she lived in [place].  She has an extensive chart and I have not been able to review it completely but it does look like she was requiring multiple resources to take care of her while there even in 2018 or 19 and now 3 years down, she is more disabled.  Given the very advanced neurodegenerative process, I would check with the husband if he would like to discuss more things about goals of care etc. going forward with our palliative care team and get them  hooked up to the palliative system as well.  D/W Dr Denton Lank  ADDENDUM EEG negative. Recs as before.  Please feel free to call with any questions.  -- Milon Dikes, MD Neurologist [facility] Pager: [phone]

## 2020-09-21 NOTE — H&P (Signed)
History and Physical   Kelly Cole IEP:[id number] DOB: 05-29-1940 DOA: 09/20/2020  PCP: Merlene Laughter, MD  Patient coming from: Home  Chief Complaint: Seizure  HPI: Kelly Cole is a 80 y.o. female with medical history significant of multiple sclerosis, neurogenic bladder status post suprapubic catheter, scoliosis, ankle foot contracture, hypertension, depression, tremor, chronic pain who presents after seizure-like activity at home. History obtained with assistance of chart review and family due to patient's altered mental status.  Patient's husband noticed her having seizure-like activity starting around 9 PM that was continuous initially started noticing she seemed more confused and then began to have full body shaking which sounds like tonic-clonic activity.  EMS reportedly witnessed to the same and gave midazolam in route.  She also received Ativan upon arrival to the ED.  She remains postictal.  She is reportedly able to converse normally at baseline. Husband feels as though she has had some decreased cognitive ability during the past week leading to this event. He has some mild cognitive impairment (slow processing) a baseline. He states that he has found her acting atypically. She was recently seen in the ED the day before presentation for catheter exchange.  ED Course: Vital signs in the ED are stable.  Lab work-up showed BMP with sodium 128, chloride 93, bicarb 21, glucose 155.  Magnesium 1.4.  Phos pending.  CBC with mild leukocytosis to 11.7 which is new from 1 day prior.  Respiratory panel for flu COVID pending.  Urinalysis pending.  Chest x-ray with left mild basilar atelectasis.  CT head without acute abnormality but did show an old lacunar infarct.  Patient received a dose of Ativan as above and also Keppra and magnesium in the ED.  Neurology was consulted and does not think she had status epilepticus but has a postictal state now.  Plan is for an MRI per EDP report when she is able  to remain still enough. Neurology will place recommendations.  Review of Systems: Unable to be obtained due to patient's altered mental status.  Past Medical History:  Diagnosis Date   HTN (hypertension)    Multiple sclerosis (HCC)     Past Surgical History:  Procedure Laterality Date   ABDOMINAL HYSTERECTOMY     APPENDECTOMY     CHOLECYSTECTOMY     HYSTERCTOMY      Social History  reports that she has never smoked. She has never used smokeless tobacco. She reports current alcohol use. She reports that she does not use drugs.  Allergies  Allergen Reactions   Demerol  [Meperidine] Other (See Comments)    No family history on file. Unable to fully review due to altered mental status.  Prior to Admission medications   Medication Sig Start Date End Date Taking? Authorizing Provider  baclofen (LIORESAL) 10 MG tablet Take 1 tablet (10 mg total) by mouth 2 (two) times daily. X 2 weeks then 3x/day- for spasticity 06/18/19   Lovorn, Aundra Millet, MD  buPROPion (ZYBAN) 150 MG 12 hr tablet Take 450 mg by mouth every morning. 09/13/18   [provider]  busPIRone (BUSPAR) 10 MG tablet Take 0.5 mg by mouth 3 (three) times daily.    [provider]  cephALEXin (KEFLEX) 500 MG capsule Take 1 capsule (500 mg total) by mouth 3 (three) times daily. 07/13/19   Linwood Dibbles, MD  collagenase (SANTYL) ointment Apply 1 application topically daily.    [provider]  cyanocobalamin (,VITAMIN B-12,) 1000 MCG/ML injection Inject 1,000 mcg into the  muscle 3 (three) times a week. 09/06/18   [provider]  gabapentin (NEURONTIN) 100 MG capsule Take 200 mg by mouth 2 (two) times daily. 12/05/19   [provider]  gabapentin (NEURONTIN) 300 MG capsule Take 300 mg by mouth 2 (two) times daily.    [provider]  ipratropium (ATROVENT) 0.03 % nasal spray As directed 06/01/19   [provider]  lisinopril (ZESTRIL) 40 MG tablet Take 40 mg by mouth daily.  08/14/18   [provider]  mirtazapine (REMERON) 30 MG tablet Take 30 mg by mouth at bedtime. 09/13/18   [provider]  propranolol (INDERAL) 10 MG tablet Take 3 tablets (30 mg total) by mouth 2 (two) times daily. Patient not taking: Reported on 12/13/2019 10/10/19   Drema Dallas, DO  propranolol ER (INDERAL LA) 80 MG 24 hr capsule Take 80 mg by mouth daily. 12/05/19   [provider]  QUEtiapine (SEROQUEL) 100 MG tablet Take 100 mg by mouth at bedtime. 09/13/18   [provider]  temazepam (RESTORIL) 15 MG capsule Take 30 mg by mouth at bedtime. 09/21/18   [provider]  tretinoin (RETIN-A) 0.025 % cream APP EXT TO FACE QD IN THE EVE 09/04/18   [provider]  venlafaxine XR (EFFEXOR-XR) 150 MG 24 hr capsule Take 300 mg by mouth every morning. 09/13/18   [provider]    Physical Exam: Vitals:   09/20/20 2330 09/20/20 2345 09/21/20 0000 09/21/20 0007  BP: 138/73 (!) 122/96 131/62   Pulse: 95 98 92   Resp:      Temp:    98.9 F (37.2 C)  TempSrc:    Rectal  SpO2: 100% 99% 100%   Weight:      Height:       Physical Exam Constitutional:      General: She is not in acute distress.    Appearance: Normal appearance.  HENT:     Head: Normocephalic and atraumatic.     Mouth/Throat:     Mouth: Mucous membranes are moist.     Pharynx: Oropharynx is clear.  Eyes:     Extraocular Movements: Extraocular movements intact.     Pupils: Pupils are equal, round, and reactive to light.  Cardiovascular:     Rate and Rhythm: Normal rate and regular rhythm.     Pulses: Normal pulses.     Heart sounds: Normal heart sounds.  Pulmonary:     Effort: Pulmonary effort is normal. No respiratory distress.     Breath sounds: Normal breath sounds.  Abdominal:     General: Bowel sounds are normal. There is no distension.     Palpations: Abdomen is soft.     Tenderness: no abdominal tenderness  Musculoskeletal:        General: No swelling or  deformity.  Skin:    General: Skin is warm and dry.  Neurological:     General: No focal deficit present.     Comments: Post ictal, not responsive to voice. Eyes moving back and forth at times.   Labs on Admission: I have personally reviewed following labs and imaging studies  CBC: Recent Labs  Lab 09/19/20 1902 09/19/20 2124 09/19/20 2210 09/20/20 2250 09/20/20 2302  WBC 7.3  --   --  11.7*  --   NEUTROABS 5.0  --   --  10.0*  --   HGB 12.6 7.1* 14.3 13.1 15.3*  15.6*  HCT 40.9 21.0* 42.0 42.9 45.0  46.0  MCV 93.6  --   --  94.5  --   PLT 250  --   --  321  --     Basic Metabolic Panel: Recent Labs  Lab 09/19/20 2124 09/19/20 2210 09/20/20 2250 09/20/20 2302  NA 139 131* 128* 127*  128*  K 3.8 4.2 4.7 4.7  4.7  CL 113* 94* 93* 92*  CO2  --   --  21*  --   GLUCOSE 58* 92 155* 157*  BUN 6* 10 9 8   CREATININE <0.20* 0.40* 0.58 0.40*  CALCIUM  --   --  9.3  --   MG  --   --  1.4*  --     GFR: Estimated Creatinine Clearance: 49.2 mL/min (A) (by C-G formula based on SCr of 0.4 mg/dL (L)).  Liver Function Tests: No results for input(s): AST, ALT, ALKPHOS, BILITOT, PROT, ALBUMIN in the last 168 hours.  Urine analysis:    Component Value Date/Time   COLORURINE YELLOW 09/19/2020 1813   APPEARANCEUR HAZY (A) 09/19/2020 1813   LABSPEC 1.008 09/19/2020 1813   PHURINE 6.0 09/19/2020 1813   GLUCOSEU NEGATIVE 09/19/2020 1813   HGBUR LARGE (A) 09/19/2020 1813   BILIRUBINUR NEGATIVE 09/19/2020 1813   KETONESUR NEGATIVE 09/19/2020 1813   PROTEINUR 100 (A) 09/19/2020 1813   NITRITE NEGATIVE 09/19/2020 1813   LEUKOCYTESUR SMALL (A) 09/19/2020 1813    Radiological Exams on Admission: CT HEAD WO CONTRAST (11/19/2020)  Result Date: 09/21/2020 CLINICAL DATA:  Delirium EXAM: CT HEAD WITHOUT CONTRAST TECHNIQUE: Contiguous axial images were obtained from the base of the skull through the vertex without intravenous contrast. COMPARISON:  None. FINDINGS: Brain: No evidence of  acute infarction, hemorrhage, hydrocephalus, extra-axial collection or mass lesion/mass effect. Global cortical and central atrophy.  Secondary ventriculomegaly. Subcortical white matter and periventricular small vessel ischemic changes. Old left basal ganglia lacunar infarct. Vascular: Intracranial atherosclerosis. Skull: Normal. Negative for fracture or focal lesion. Sinuses/Orbits: The visualized paranasal sinuses are essentially clear. The mastoid air cells are unopacified. Other: None. IMPRESSION: No evidence of acute intracranial abnormality. Left basal ganglia lacunar infarct. Atrophy with small vessel ischemic changes. Electronically Signed   By: 09/23/2020 M.D.   On: 09/21/2020 00:13   DG Chest Portable 1 View  Result Date: 09/20/2020 CLINICAL DATA:  Altered mental status and recent seizure activity EXAM: PORTABLE CHEST 1 VIEW COMPARISON:  07/13/2018 FINDINGS: Cardiac shadow is within normal limits. Aortic calcifications are seen. Mild left basilar atelectasis is noted. No sizable effusion is noted. No bony abnormality is seen. IMPRESSION: Mild left basilar atelectasis. Electronically Signed   By: 09/12/2018 M.D.   On: 09/20/2020 23:13    EKG: Independently reviewed.  Sinus rhythm at 90 bpm.  Baseline artifact.  I will interpretation complicated by significant artifact.  Assessment/Plan Principal Problem:   Seizure (HCC) Active Problems:   Multiple sclerosis (HCC)   Scoliosis   Spasticity   Contracture of ankle and foot joint, unspecified laterality   Essential hypertension   Essential tremor   Mild recurrent major depression (HCC)   Suprapubic catheter (HCC)  Seizure > Witnessed seizure activity by husband start around 9 PM.  Also witnessed by EMS.  Received midazolam in route and Ativan in the ED also was given a dose of Keppra in the ED. > Remains postictal in the ED and no more seizure-like activity.  Some intermittent nystagmus has been reported. > Has been seen by  neurology who recommended Keppra and plan for MRI later  when patient is able to remain still enough to get quality MRI. > Mild leukocytosis noted in the ED of 11.7.  This is new from lab work-up that was done the day or before.  Likely secondary to seizure.  No evidence of infection on chest x-ray urinalysis is pending. - Appreciate neurology recommendations - Continue with Keppra as per neurology - We will wait for neurology Rex for MRI order - EEG  Hyponatremia > Mild with Na 128 in the ED - IVF overnight  Spasticity Multiple sclerosis Neurogenic bladder status post suprapubic catheter > Known MS and neurogenic bladder. -Plan to resume home medications once able to tolerate p.o. and once they are confirmed by pharmacy  Depression -Plan to resume home medications once able to tolerate p.o. and once they are confirmed by pharmacy  Hypertension - Plan to resume home medications once able to tolerate p.o. and once they are confirmed by pharmacy - It appears she is taking lisinopril and propranolol  Tremor  - Plan to resume home medications once able to tolerate p.o. and once they are confirmed by pharmacy - It appears she is taking propanolol  DVT prophylaxis: Lovenox Code Status:   DNR  Family Communication:  Husband updated by phone Disposition Plan:   Patient is from:  Home  Anticipated DC to:  Home  Anticipated DC date:  1 to 3 days  Anticipated DC barriers: none  Consults called:  Neurology consulted by EDP Admission status:  Observation, telemetry   Severity of Illness: The appropriate patient status for this patient is OBSERVATION. Observation status is judged to be reasonable and necessary in order to provide the required intensity of service to ensure the patient's safety. The patient's presenting symptoms, physical exam findings, and initial radiographic and laboratory data in the context of their medical condition is felt to place them at decreased risk for further  clinical deterioration. Furthermore, it is anticipated that the patient will be medically stable for discharge from the hospital within 2 midnights of admission. The following factors support the patient status of observation.   " The patient's presenting symptoms include seizure, altered mental status. " The physical exam findings include postictal state, altered mental status. " The initial radiographic and laboratory data are Lab work-up showed BMP with sodium 128, chloride 93, bicarb 21, glucose 155.  Magnesium 1.4.  Phos pending.  CBC with mild leukocytosis to 11.7 which is new from 1 day prior.  Respiratory panel for flu COVID pending.  Urinalysis pending.  Chest x-ray with left mild basilar atelectasis.  CT head without acute abnormality but did show an old lacunar infarct.   Synetta Fail MD [facility]  How to contact the [facility] Attending or Consulting provider 7A - 7P or covering provider during after hours 7P -7A, for this patient?   Check the care team in [facility] and look for a) attending/consulting TR[facility] provider listed and b) the [facility] team listed Log into [email or web address] and use [place]'s universal password to access. If you do not have the password, please contact the hospital operator. Locate the [facility] provider you are looking for under [facility] and page to a number that you can be directly reached. If you still have difficulty reaching the provider, please page the [facility] (Director on Call) for the Hospitalists listed on amion for assistance.  09/21/2020, 1:03 AM

## 2020-09-21 NOTE — ED Notes (Signed)
Report given to Joneen Roach, RN

## 2020-09-21 NOTE — ED Notes (Signed)
Patient transported to MRI 

## 2020-09-22 ENCOUNTER — Encounter (HOSPITAL_COMMUNITY): Payer: Self-pay | Admitting: Internal Medicine

## 2020-09-22 DIAGNOSIS — L8990 Pressure ulcer of unspecified site, unspecified stage: Secondary | ICD-10-CM | POA: Insufficient documentation

## 2020-09-22 LAB — CBC
HCT: 35.9 % — ABNORMAL LOW (ref 36.0–46.0)
Hemoglobin: 11.7 g/dL — ABNORMAL LOW (ref 12.0–15.0)
MCH: 29.4 pg (ref 26.0–34.0)
MCHC: 32.6 g/dL (ref 30.0–36.0)
MCV: 90.2 fL (ref 80.0–100.0)
Platelets: 223 10*3/uL (ref 150–400)
RBC: 3.98 MIL/uL (ref 3.87–5.11)
RDW: 12.7 % (ref 11.5–15.5)
WBC: 12.5 10*3/uL — ABNORMAL HIGH (ref 4.0–10.5)
nRBC: 0 % (ref 0.0–0.2)

## 2020-09-22 LAB — BASIC METABOLIC PANEL
Anion gap: 12 (ref 5–15)
BUN: <5 mg/dL — ABNORMAL LOW (ref 8–23)
CO2: 26 mmol/L (ref 22–32)
Calcium: 8.2 mg/dL — ABNORMAL LOW (ref 8.9–10.3)
Chloride: 90 mmol/L — ABNORMAL LOW (ref 98–111)
Creatinine, Ser: 0.49 mg/dL (ref 0.44–1.00)
GFR, Estimated: >60 mL/min (ref >60)
Glucose, Bld: 105 mg/dL — ABNORMAL HIGH (ref 70–99)
Potassium: 2.8 mmol/L — ABNORMAL LOW (ref 3.5–5.1)
Sodium: 128 mmol/L — ABNORMAL LOW (ref 135–145)

## 2020-09-22 LAB — URINE CULTURE: Culture: 80000 — AB

## 2020-09-22 LAB — POTASSIUM: Potassium: 2.8 mmol/L — ABNORMAL LOW (ref 3.5–5.1)

## 2020-09-22 LAB — MAGNESIUM: Magnesium: 1.8 mg/dL (ref 1.7–2.4)

## 2020-09-22 MED ORDER — VENLAFAXINE HCL ER 75 MG PO CP24
300.0000 mg | ORAL_CAPSULE | Freq: Every day | ORAL | Status: DC
  Filled 2020-09-22: qty 4

## 2020-09-22 MED ORDER — VITAMIN D 25 MCG (1000 UNIT) PO TABS
2000.0000 [IU] | ORAL_TABLET | Freq: Three times a day (TID) | ORAL | Status: DC
  Administered 2020-09-22 – 2020-09-26 (×10): 2000 [IU] via ORAL
  Filled 2020-09-22 (×12): qty 2

## 2020-09-22 MED ORDER — KCL-LACTATED RINGERS 20 MEQ/L IV SOLN
INTRAVENOUS | Status: DC
  Filled 2020-09-22: qty 1000

## 2020-09-22 MED ORDER — POTASSIUM CHLORIDE 2 MEQ/ML IV SOLN
INTRAVENOUS | Status: DC
  Filled 2020-09-22 (×3): qty 1000

## 2020-09-22 MED ORDER — PANTOPRAZOLE SODIUM 40 MG PO TBEC
40.0000 mg | DELAYED_RELEASE_TABLET | Freq: Every day | ORAL | Status: DC
  Administered 2020-09-22 – 2020-09-23 (×2): 40 mg via ORAL
  Filled 2020-09-22 (×2): qty 1

## 2020-09-22 MED ORDER — CHLORHEXIDINE GLUCONATE CLOTH 2 % EX PADS
6.0000 | MEDICATED_PAD | Freq: Every day | CUTANEOUS | Status: DC
  Administered 2020-09-22 – 2020-09-26 (×5): 6 via TOPICAL

## 2020-09-22 MED ORDER — MIRABEGRON ER 25 MG PO TB24
50.0000 mg | ORAL_TABLET | Freq: Every day | ORAL | Status: DC
  Administered 2020-09-22: 50 mg via ORAL
  Filled 2020-09-22 (×2): qty 2

## 2020-09-22 MED ORDER — POTASSIUM CHLORIDE 10 MEQ/100ML IV SOLN
10.0000 meq | INTRAVENOUS | Status: AC
  Administered 2020-09-22: 10 meq via INTRAVENOUS
  Filled 2020-09-22: qty 100

## 2020-09-22 MED ORDER — PROPRANOLOL HCL ER 80 MG PO CP24
80.0000 mg | ORAL_CAPSULE | Freq: Every day | ORAL | Status: DC
  Administered 2020-09-22: 80 mg via ORAL
  Filled 2020-09-22 (×2): qty 1

## 2020-09-22 MED ORDER — AMLODIPINE BESYLATE 2.5 MG PO TABS
2.5000 mg | ORAL_TABLET | Freq: Every day | ORAL | Status: DC
  Administered 2020-09-22 – 2020-09-24 (×3): 2.5 mg via ORAL
  Filled 2020-09-22 (×3): qty 1

## 2020-09-22 MED ORDER — BUSPIRONE HCL 10 MG PO TABS
10.0000 mg | ORAL_TABLET | Freq: Three times a day (TID) | ORAL | Status: DC
  Administered 2020-09-22 – 2020-09-23 (×3): 10 mg via ORAL
  Filled 2020-09-22 (×3): qty 1

## 2020-09-22 MED ORDER — LOPERAMIDE HCL 2 MG PO CAPS: 2.0000 mg | ORAL_CAPSULE | Freq: Three times a day (TID) | ORAL | Status: DC

## 2020-09-22 MED ORDER — CHLORHEXIDINE GLUCONATE 0.12 % MT SOLN
15.0000 mL | Freq: Two times a day (BID) | OROMUCOSAL | Status: DC
  Administered 2020-09-22 – 2020-09-26 (×9): 15 mL via OROMUCOSAL
  Filled 2020-09-22 (×9): qty 15

## 2020-09-22 MED ORDER — ACETAMINOPHEN 500 MG PO TABS
1000.0000 mg | ORAL_TABLET | Freq: Two times a day (BID) | ORAL | Status: DC
  Administered 2020-09-23 – 2020-09-26 (×7): 1000 mg via ORAL
  Filled 2020-09-22 (×7): qty 2

## 2020-09-22 MED ORDER — POTASSIUM CHLORIDE 10 MEQ/100ML IV SOLN
10.0000 meq | INTRAVENOUS | Status: AC
Start: 2020-09-22 — End: 2020-09-22
  Administered 2020-09-22 (×2): 10 meq via INTRAVENOUS
  Filled 2020-09-22: qty 100

## 2020-09-22 MED ORDER — QUETIAPINE FUMARATE 50 MG PO TABS
75.0000 mg | ORAL_TABLET | Freq: Every day | ORAL | Status: DC
  Administered 2020-09-22 – 2020-09-25 (×4): 75 mg via ORAL
  Filled 2020-09-22 (×4): qty 1

## 2020-09-22 MED ORDER — POTASSIUM CHLORIDE 10 MEQ/100ML IV SOLN
10.0000 meq | INTRAVENOUS | Status: AC
Start: 2020-09-22 — End: 2020-09-22
  Administered 2020-09-22 (×2): 10 meq via INTRAVENOUS
  Filled 2020-09-22 (×3): qty 100

## 2020-09-22 MED ORDER — BUPROPION HCL ER (XL) 150 MG PO TB24
450.0000 mg | ORAL_TABLET | Freq: Every day | ORAL | Status: DC
  Administered 2020-09-22: 450 mg via ORAL
  Filled 2020-09-22 (×2): qty 3

## 2020-09-22 MED ORDER — ORAL CARE MOUTH RINSE
15.0000 mL | Freq: Two times a day (BID) | OROMUCOSAL | Status: DC
  Administered 2020-09-22 – 2020-09-26 (×9): 15 mL via OROMUCOSAL

## 2020-09-22 MED ORDER — LABETALOL HCL 5 MG/ML IV SOLN: 5.0000 mg | INTRAVENOUS | Status: DC | PRN

## 2020-09-22 MED ORDER — LOPERAMIDE HCL 2 MG PO CAPS
2.0000 mg | ORAL_CAPSULE | Freq: Three times a day (TID) | ORAL | Status: DC | PRN
  Administered 2020-09-25 – 2020-09-26 (×2): 2 mg via ORAL
  Filled 2020-09-22 (×2): qty 1

## 2020-09-22 MED ORDER — CHOLESTYRAMINE 4 G PO PACK
4.0000 g | PACK | Freq: Two times a day (BID) | ORAL | Status: DC
  Administered 2020-09-23 – 2020-09-26 (×5): 4 g via ORAL
  Filled 2020-09-22 (×8): qty 1

## 2020-09-22 NOTE — Evaluation (Signed)
Clinical/Bedside Swallow Evaluation Patient Details  Name: Kelly Cole MRN: [id number] Date of Birth: [birth year]  Today's Date: 09/22/2020 Time: SLP Start Time (ACUTE ONLY): 1506 SLP Stop Time (ACUTE ONLY): 1540 SLP Time Calculation (min) (ACUTE ONLY): 34 min  Past Medical History:  Past Medical History:  Diagnosis Date   HTN (hypertension)    Multiple sclerosis (HCC)    Past Surgical History:  Past Surgical History:  Procedure Laterality Date   ABDOMINAL HYSTERECTOMY     APPENDECTOMY     CHOLECYSTECTOMY     HYSTERCTOMY     HPI:  Pt is a 80 yo female admitted with AMS with concern for seizure activity and toxic/metabolic encephalopathy. CTH negative for acute changes. PMH: MS, HTN, scoliosis, chronic pain, cerebellar tumor, neurogenic bladder with suprapubic catheter   Assessment / Plan / Recommendation Clinical Impression  Pt's husband reports perhaps a mild baseline dysphagia, which he attributes to possible esophageal issues (he questions if she had esophageal spasms), and for which she had been instructed by an OT previously to take small bites and alternate bites/sips. Today she has consistent coughing with thin liquids on all but one sip provided. There is no coughing observed with thickened liquids or purees, although she does have a consistent second swallow with purees and she needs minimal extra time to clear them from her oral cavity. Given that her mentation is also not returned to baseline, recommend starting with Dys 1 (puree) diet and nectar thick liquids; meds crushed in puree. Will f/u for potential to advance vs need for additional instrumental testing if potential s/s of aspiration persist. SLP Visit Diagnosis: Dysphagia, unspecified (R13.10)    Aspiration Risk  Mild aspiration risk;Moderate aspiration risk    Diet Recommendation Dysphagia 1 (Puree);Nectar-thick liquid   Liquid Administration via: Cup;Straw Medication Administration: Crushed with  puree Supervision: Staff to assist with self feeding Compensations: Minimize environmental distractions;Slow rate;Small sips/bites;Follow solids with liquid Postural Changes: Seated upright at 90 degrees;Remain upright for at least 30 minutes after po intake    Other  Recommendations Oral Care Recommendations: Oral care QID Other Recommendations: Prohibited food (jello, ice cream, thin soups);Remove water pitcher   Follow up Recommendations  (tba)      Frequency and Duration min 2x/week  2 weeks       Prognosis Prognosis for Safe Diet Advancement: Good      Swallow Study   General HPI: Pt is a 80 yo female admitted with AMS with concern for seizure activity and toxic/metabolic encephalopathy. CTH negative for acute changes. PMH: MS, HTN, scoliosis, chronic pain, cerebellar tumor, neurogenic bladder with suprapubic catheter Type of Study: Bedside Swallow Evaluation Previous Swallow Assessment: none in chart Diet Prior to this Study: NPO Temperature Spikes Noted: Yes (100.7) Respiratory Status: Room air History of Recent Intubation: No Behavior/Cognition: Alert;Cooperative;Requires cueing;Distractible Oral Cavity Assessment: Within Functional Limits Oral Care Completed by SLP: No Oral Cavity - Dentition: Adequate natural dentition Self-Feeding Abilities: Total assist Patient Positioning: Upright in bed;Postural control adequate for testing Baseline Vocal Quality: Normal Volitional Cough: Strong Volitional Swallow: Able to elicit    Oral/Motor/Sensory Function Overall Oral Motor/Sensory Function: Generalized oral weakness   Ice Chips Ice chips: Within functional limits Presentation: Spoon   Thin Liquid Thin Liquid: Impaired Presentation: Spoon;Straw Pharyngeal  Phase Impairments: Multiple swallows;Cough - Immediate    Nectar Thick Nectar Thick Liquid: Within functional limits Presentation: Straw   Honey Thick Honey Thick Liquid: Not tested   Puree Puree:  Impaired Presentation: Spoon Oral Phase Functional  Implications: Prolonged oral transit Pharyngeal Phase Impairments: Multiple swallows   Solid     Solid: Not tested      Mahala Menghini., M.A. CCC-SLP Acute Rehabilitation Services Pager [phone] Office [phone]  09/22/2020,3:50 PM

## 2020-09-22 NOTE — Progress Notes (Signed)
Initial Nutrition Assessment  DOCUMENTATION CODES:   Not applicable  INTERVENTION:  If diet unable to be advanced within 24-48 hours and if aligned with GOC, recommend placement of Cortrak for initiation of TF. Consider: -Osmolite 1.2 @ 49ml/hr, advance by 54ml/hr Q4H until goal rate of 35ml/hr (1333ml/d) is reached -29ml Prosource TF daily  At goal, TF would provide 1624 kcals, 84 grams protein, free water  NUTRITION DIAGNOSIS:   Inadequate oral intake related to inability to eat as evidenced by NPO status.  GOAL:   Patient will meet greater than or equal to 90% of their needs  MONITOR:   Weight trends, Labs, I & O's  REASON FOR ASSESSMENT:   Malnutrition Screening Tool    ASSESSMENT:   Pt with PMH significant for HTN and MS presented to the ED after what appeared to be seizure activity at home. Pt's mental status consistent with encephalopathy likely 2/2 infection and metabolic derangements.  Pt unavailable at time of RD visit. Pt remains NPO at this time. If diet unable to be safely advanced within 24-48 hours, recommend discussion regarding GOC.   Medications reviewed. Labs: Recent Labs  Lab 09/20/20 2250 09/20/20 2302 09/21/20 0403 09/22/20 0248  NA 128* 127*  128* 130* 128*  K 4.7 4.7  4.7 3.3* 2.8*  2.8*  CL 93* 92* 100 90*  CO2 21*  --  23 26  BUN 9 8 6* <5*  CREATININE 0.58 0.40* 0.38* 0.49  CALCIUM 9.3  --  7.7* 8.2*  MG 1.4*  --  1.6* 1.8  GLUCOSE 155* 157* 106* 105*   UOP: 1.9Lx24 hours I/O: -1.2L since admit  Diet Order:   Diet Order             Diet NPO time specified Except for: Sips with Meds  Diet effective now                   EDUCATION NEEDS:   Not appropriate for education at this time  Skin:  Skin Assessment: Skin Integrity Issues: Skin Integrity Issues:: Stage II Stage II: sacrum  Last BM:  PTA  Height:   Ht Readings from Last 1 Encounters:  09/20/20 5\' 4"  (1.626 m)    Weight:   Wt Readings from  Last 10 Encounters:  09/20/20 59 kg  05/30/20 54 kg  08/27/19 56.2 kg  07/13/19 56.2 kg  06/18/19 60.8 kg  04/16/19 60.8 kg  03/19/19 56.7 kg   BMI:  Body mass index is 22.31 kg/m.  Estimated Nutritional Needs:   Kcal:  1500-1700  Protein:  75-85 grams  Fluid:  >1.5L/d    05/17/19, MS, RD, LDN (she/her/hers) RD pager number and weekend/on-call pager number located in Amion.

## 2020-09-22 NOTE — Progress Notes (Signed)
Neurology Progress Note  S: Husband states she is a tad more awake today than prior, but still not back to home baseline of having a conversation. Husband states she smiled at him when he arrived today. Husband states she is seeing things on the wall at times.   O:  Current vital signs: BP 136/78 (BP Location: Right Arm)   Pulse (!) 107   Temp 98.7 F (37.1 C) (Oral)   Resp 18   Ht 5\' 4"  (1.626 m)   Wt 59 kg   SpO2 97%   BMI 22.31 kg/m  Vital signs in last 24 hours: Temp:  [98.4 F (36.9 C)-100.7 F (38.2 C)] 98.7 F (37.1 C) (08/15 0325) Pulse Rate:  [46-110] 107 (08/15 0325) Resp:  [14-28] 18 (08/15 0325) BP: (106-174)/(54-93) 136/78 (08/15 0325) SpO2:  [92 %-100 %] 97 % (08/15 0325)  GENERAL: chronically ill appearing female who opens her eyes to name calling. NAD.  HEENT: Normocephalic and atraumatic. LUNGS: Normal respiratory effort.  Ext: warm.   NEURO:  Mental Status: Awake and can tell NP her husband's name (at bedside). Does not follow commands.  Speech/Language: Speech is not fluent. Does not name or repeat.    Cranial Nerves:  II: PERRL.  III, IV, VI: EOMI and crosses midline. She will track examiner from one side of bed to the other. Eyelids elevate symmetrically.  V: Facial sensation intact to pupil check.  VII: Face is grossly symmetric.   VIII: hearing intact to voice. Motor: BLE contractures (chronic). R hand contracture.   Medications  Current Facility-Administered Medications:    acetaminophen (TYLENOL) tablet 650 mg, 650 mg, Oral, Q6H PRN **OR** acetaminophen (TYLENOL) suppository 650 mg, 650 mg, Rectal, Q6H PRN, 10[birth year]06-1984, MD, 650 mg at 09/22/20 09/24/20   azithromycin (ZITHROMAX) 500 mg in sodium chloride 0.9 % 250 mL IVPB, 500 mg, Intravenous, Q24H, 4097, DO, Stopped at 09/21/20 1458   baclofen (LIORESAL) tablet 10 mg, 10 mg, Oral, BID, 09/23/20, MD   cefTRIAXone (ROCEPHIN) 1 g in sodium chloride 0.9 % 100 mL IVPB,  1 g, Intravenous, Q24H, Synetta Fail, DO, Stopped at 09/21/20 1339   chlorhexidine (PERIDEX) 0.12 % solution 15 mL, 15 mL, Mouth Rinse, BID, 09/23/20, Kelly A, DO   Chlorhexidine Gluconate Cloth 2 % PADS 6 each, 6 each, Topical, Daily, Denton Lank A, DO   enoxaparin (LOVENOX) injection 30 mg, 30 mg, Subcutaneous, Q24H, Esaw Grandchild B, MD, 30 mg at 09/21/20 1145   levETIRAcetam (KEPPRA) IVPB 500 mg/100 mL premix, 500 mg, Intravenous, Q12H, Bhagat, Srishti L, MD, Stopped at 09/22/20 0140   LORazepam (ATIVAN) injection 1 mg, 1 mg, Intravenous, Q4H PRN, 09/24/20 A, DO, 1 mg at 09/22/20 0124   LORazepam (ATIVAN) injection 2 mg, 2 mg, Intravenous, PRN, 09/24/20 A, DO   MEDLINE mouth rinse, 15 mL, Mouth Rinse, q12n4p, Esaw Grandchild A, DO   polyethylene glycol (MIRALAX / GLYCOLAX) packet 17 g, 17 g, Oral, Daily PRN, Esaw Grandchild, MD   sodium chloride flush (NS) 0.9 % injection 3 mL, 3 mL, Intravenous, Q12H, Synetta Fail, MD, 3 mL at 09/22/20 0005   temazepam (RESTORIL) capsule 30 mg, 30 mg, Oral, QHS, 09/24/20, MD  Pertinent Labs NA 128.     K 2.8.      WBCC trending downward.   No new Imaging  EEG This technically difficult study is suggestive of moderate diffuse encephalopathy, nonspecific etiology. No seizures or epileptiform discharges  were seen throughout the recording.  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 Her mental status was consistent with encephalopathy likely secondary to infection and metabolic derangements. NPs mental status exam appears to be improved over prior exams. She is having visual hallucinations which would be more consistent with delirium. Given her Temazepam at home and her inability to swallow at present, Ativan was added to prevent Benzodiazepine withdrawal.   Impression: -Toxic metabolic encephalopathy due to infections and metabolic derangements.    Recommendations/Plan:  -Unable to get MRI brain due to mental status, and doubt this would be of high yield anyway.  -Continue to treat infection and metabolic derangements as you are doing.  -Continue Ativan until can safely swallow Tamazepam.  -Continue Keppa 500mg  IV q12 hours.  -Delirium precautions.  -Avoid medications on the Beer's list for the elderly.   Pt seen by , MSN, APN-BC/Nurse Practitioner/Neuro and later by MD. Note and plan to be edited as needed by MD.  Pager: Jimmye Norman

## 2020-09-22 NOTE — Progress Notes (Signed)
PROGRESS NOTE    Kelly Cole   KLK:[id number]  DOB: 25-Feb-1940  PCP: Merlene Laughter, MD    DOA: 09/20/2020 LOS: 1   Assessment & Plan   Principal Problem:   Seizure (HCC) Active Problems:   Multiple sclerosis (HCC)   Scoliosis   Spasticity   Contracture of ankle and foot joint, unspecified laterality   Essential hypertension   Essential tremor   Mild recurrent major depression (HCC)   Suprapubic catheter (HCC)   Seizure-like activity (HCC)   Pressure injury of skin   Seizure-like activity - neurology consulted, loaded with Keppra. EEG showed only diffuse encephalopathy (obtained after benzos given).   -- Appreciate neurology recommendations --Continue Keppra 500 mg twice daily --Patient is no longer on baclofen, order Dc'd --Ativan as needed if seizure activity seen and to prevent withdrawal seizure (takes temazepam at home and currently unable to take p.o.) -- Treat for infection as below  Acute metabolic encephalopathy -patient was reportedly in a postictal state following her seizure-like activity according to husband and related reports of EMS.  Responding well and improving with IV antibiotics. --Continue treating infections as below --Delirium precautions --  Likely UTI -UA grossly positive.  Chronic suprapubic catheter. Clinically improving with antibiotics. -- Follow urine culture --Continue Rocephin  Left lower lobe pneumonia -suspect aspiration.  Started on empiric CAP coverage and clinically improving.  She does not appear to have respiratory symptoms however. -- Continue Rocephin and Zithromax --Speech evaluation  Hypokalemia -replacing with LR +20 mEq Kcl + IVPB's x4 -- Monitor BMP and replace as needed  Hypomagnesemia -resolved with replacement. --Monitor replace as needed  Hyponatremia -mild, given clinical setting this is most likely due to lack of p.o. intake. -- Continue LR --Monitor BMP --Further evaluation etiology if not improving  with fluids  Dysphagia -attempted bedside swallow with RN, failed.  Likely due to her altered mental status in addition to progressive MS. --SLP eval --Aspiration cautions  Primary progressive MS with spasticity - husband confirmed patient no longer takes baclofen.  DC baclofen order, do not resume on discharge.  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  Chronic suprapubic catheter. -- Resume Myrbetriq once taking p.o.  Depression - resume home meds once taking p.o. --IV labetalol as needed for now  Hypertension -resume home meds once taking p.o.  Tremor -continue propanolol when taking p.o.   Pressure injury of skin -POA Pressure Injury 09/22/20 Sacrum Stage 2 -  Partial thickness loss of dermis presenting as a shallow open injury with a red, pink wound bed without slough. (Active)  09/22/20   Location: Sacrum  Location Orientation:   Staging: Stage 2 -  Partial thickness loss of dermis presenting as a shallow open injury with a red, pink wound bed without slough.  Wound Description (Comments):   Present on Admission: Yes  --Continue wound care and frequent repositioning --Offload pressure areas much as possible   Patient BMI: Body mass index is 22.31 kg/m.   DVT prophylaxis: enoxaparin (LOVENOX) injection 30 mg Start: 09/21/20 1000   Diet:  Diet Orders (From admission, onward)     Start     Ordered   09/22/20 1554  DIET - DYS 1 Room service appropriate? No; Fluid consistency: Nectar Thick  Diet effective now       Question Answer Comment  Room service appropriate? No   Fluid consistency: Nectar Thick      09/22/20 1553  Code Status: DNR   Brief Narrative / Hospital Course to Date:   Kelly Cole is a 80 y.o. female with medical history significant of multiple sclerosis, neurogenic bladder status post suprapubic catheter, scoliosis, ankle foot contracture, hypertension, depression, tremor, chronic pain  who presents after seizure-like activity at home.  Subjective 09/22/20    Patient seen with husband at bedside this morning.  She appears feeling much better and is awake, smiling and able to talk although not as well as at her baseline, per husband.  She denies pain, fever/chills or other acute complaints.     Disposition Plan & Communication   Status is: Inpatient  Remains inpatient appropriate because:IV treatments appropriate due to intensity of illness or inability to take PO  Dispo: The patient is from: Home              Anticipated d/c is to: Home              Patient currently is not medically stable to d/c.   Difficult to place patient No   Family Communication: Husband at bedside on rounds   Consults, Procedures, Significant Events   Consultants:  Neurology  Procedures:  EEG 8/14 -  Impression:technically difficult study is suggestive of moderate diffuse encephalopathy, nonspecific etiology. No seizures or epileptiform discharges were seen throughout the recording  Antimicrobials:  Anti-infectives (From admission, onward)    Start     Dose/Rate Route Frequency Ordered Stop   09/21/20 1245  azithromycin (ZITHROMAX) 500 mg in sodium chloride 0.9 % 250 mL IVPB        500 mg 250 mL/hr over 60 Minutes Intravenous Every 24 hours 09/21/20 1230     09/21/20 1230  cefTRIAXone (ROCEPHIN) 1 g in sodium chloride 0.9 % 100 mL IVPB        1 g 200 mL/hr over 30 Minutes Intravenous Every 24 hours 09/21/20 1229           Micro    Objective   Vitals:   09/22/20 0131 09/22/20 0325 09/22/20 0942 09/22/20 1303  BP:  136/78 (!) 157/90 (!) 168/60  Pulse:  (!) 107 (!) 102 98  Resp:  18  18  Temp: 98.4 F (36.9 C) 98.7 F (37.1 C) 98.3 F (36.8 C) 98.8 F (37.1 C)  TempSrc: Oral Oral Oral Axillary  SpO2:  97%  96%  Weight:      Height:        Intake/Output Summary (Last 24 hours) at 09/22/2020 1715 Last data filed at 09/22/2020 0700 Gross per 24 hour  Intake  536.64 ml  Output 1200 ml  Net -663.36 ml   Filed Weights   09/20/20 2308  Weight: 59 kg    Physical Exam:  General exam: awake, alert, no acute distress HEENT: clear conjunctiva, anicteric sclera, moist mucus membranes, hearing grossly normal  Respiratory system: Diminished left base but otherwise clear, no wheezes, rales or rhonchi, normal respiratory effort. Cardiovascular system: normal S1/S2, RRR, no JVD, murmurs, rubs, gallops, no pedal edema.   Gastrointestinal system: soft, NT, ND, no HSM felt, +bowel sounds. Central nervous system: Stable upper extremity spasticity, follows commands, minimally verbal but overall normal speech without slurring Genitourinary -suprapubic catheter in place, clear amber urine in bag Psychiatry: normal mood, congruent affect  Labs   Data Reviewed: I have personally reviewed following labs and imaging studies  CBC: Recent Labs  Lab 09/19/20 1902 09/19/20 2124 09/19/20 2210 09/20/20 2250 09/20/20 2302 09/21/20 0403 09/22/20 0248  WBC 7.3  --   --  11.7*  --  12.8* 12.5*  NEUTROABS 5.0  --   --  10.0*  --   --   --   HGB 12.6   < > 14.3 13.1 15.3*  15.6* 10.6* 11.7*  HCT 40.9   < > 42.0 42.9 45.0  46.0 33.8* 35.9*  MCV 93.6  --   --  94.5  --  92.9 90.2  PLT 250  --   --  321  --  209 223   < > = values in this interval not displayed.   Basic Metabolic Panel: Recent Labs  Lab 09/19/20 2210 09/20/20 2250 09/20/20 2302 09/21/20 0403 09/22/20 0248  NA 131* 128* 127*  128* 130* 128*  K 4.2 4.7 4.7  4.7 3.3* 2.8*  2.8*  CL 94* 93* 92* 100 90*  CO2  --  21*  --  23 26  GLUCOSE 92 155* 157* 106* 105*  BUN 10 9 8  6* <5*  CREATININE 0.40* 0.58 0.40* 0.38* 0.49  CALCIUM  --  9.3  --  7.7* 8.2*  MG  --  1.4*  --  1.6* 1.8   GFR: Estimated Creatinine Clearance: 49.2 mL/min (by C-G formula based on SCr of 0.49 mg/dL). Liver Function Tests: Recent Labs  Lab 09/21/20 0403  AST 17  ALT 13  ALKPHOS 43  BILITOT 0.8  PROT 4.8*   ALBUMIN 2.9*   No results for input(s): LIPASE, AMYLASE in the last 168 hours. No results for input(s): AMMONIA in the last 168 hours. Coagulation Profile: No results for input(s): INR, PROTIME in the last 168 hours. Cardiac Enzymes: No results for input(s): CKTOTAL, CKMB, CKMBINDEX, TROPONINI in the last 168 hours. BNP (last 3 results) No results for input(s): PROBNP in the last 8760 hours. HbA1C: No results for input(s): HGBA1C in the last 72 hours. CBG: Recent Labs  Lab 09/20/20 2256  GLUCAP 156*   Lipid Profile: No results for input(s): CHOL, HDL, LDLCALC, TRIG, CHOLHDL, LDLDIRECT in the last 72 hours. Thyroid Function Tests: No results for input(s): TSH, T4TOTAL, FREET4, T3FREE, THYROIDAB in the last 72 hours. Anemia Panel: No results for input(s): VITAMINB12, FOLATE, FERRITIN, TIBC, IRON, RETICCTPCT in the last 72 hours. Sepsis Labs: Recent Labs  Lab 09/21/20 0403  PROCALCITON <0.10    Recent Results (from the past 240 hour(s))  Urine Culture     Status: Abnormal   Collection Time: 09/19/20  7:02 PM   Specimen: Urine, Catheterized  Result Value Ref Range Status   Specimen Description   Final    URINE, CATHETERIZED Performed at [facility], [address] [place], [place] [address]    Special Requests   Final    NONE Performed at [facility], [address] [place], [place] [address]    Culture 80,000 COLONIES/mL PROTEUS MIRABILIS (A)  Final   Report Status 09/22/2020 FINAL  Final   Organism ID, Bacteria PROTEUS MIRABILIS (A)  Final      Susceptibility   Proteus mirabilis - MIC*    AMPICILLIN <=2 SENSITIVE Sensitive     CEFAZOLIN <=4 SENSITIVE Sensitive     CEFEPIME <=0.12 SENSITIVE Sensitive     CEFTRIAXONE <=0.25 SENSITIVE Sensitive     CIPROFLOXACIN <=0.25 SENSITIVE Sensitive     GENTAMICIN <=1 SENSITIVE Sensitive     IMIPENEM 2 SENSITIVE Sensitive     NITROFURANTOIN 128 RESISTANT Resistant     TRIMETH/SULFA  <=20 SENSITIVE Sensitive     AMPICILLIN/SULBACTAM <=2 SENSITIVE Sensitive  PIP/TAZO <=4 SENSITIVE Sensitive     * 80,000 COLONIES/mL PROTEUS MIRABILIS  Resp Panel by RT-PCR (Flu A&B, Covid) Nasopharyngeal Swab     Status: None   Collection Time: 09/21/20  4:37 AM   Specimen: Nasopharyngeal Swab; Nasopharyngeal(NP) swabs in vial transport medium  Result Value Ref Range Status   SARS Coronavirus 2 by RT PCR NEGATIVE NEGATIVE Final    Comment: (NOTE) SARS-CoV-2 target nucleic acids are NOT DETECTED.  The SARS-CoV-2 RNA is generally detectable in upper respiratory specimens during the acute phase of infection. The lowest concentration of SARS-CoV-2 viral copies this assay can detect is 138 copies/mL. A negative result does not preclude SARS-Cov-2 infection and should not be used as the sole basis for treatment or other patient management decisions. A negative result may occur with  improper specimen collection/handling, submission of specimen other than nasopharyngeal swab, presence of viral mutation(s) within the areas targeted by this assay, and inadequate number of viral copies(<138 copies/mL). A negative result must be combined with clinical observations, patient history, and epidemiological information. The expected result is Negative.  Fact Sheet for Patients:  BloggerCourse.com  Fact Sheet for Healthcare Providers:  SeriousBroker.it  This test is no t yet approved or cleared by the [place] FDA and  has been authorized for detection and/or diagnosis of SARS-CoV-2 by FDA under an Emergency Use Authorization (EUA). This EUA will remain  in effect (meaning this test can be used) for the duration of the COVID-19 declaration under Section 564(b)(1) of the Act, 21 U.S.C.section 360bbb-3(b)(1), unless the authorization is terminated  or revoked sooner.       Influenza A by PCR NEGATIVE NEGATIVE Final   Influenza B by PCR  NEGATIVE NEGATIVE Final    Comment: (NOTE) The Xpert Xpress SARS-CoV-2/FLU/RSV plus assay is intended as an aid in the diagnosis of influenza from Nasopharyngeal swab specimens and should not be used as a sole basis for treatment. Nasal washings and aspirates are unacceptable for Xpert Xpress SARS-CoV-2/FLU/RSV testing.  Fact Sheet for Patients: BloggerCourse.com  Fact Sheet for Healthcare Providers: SeriousBroker.it  This test is not yet approved or cleared by the [place] FDA and has been authorized for detection and/or diagnosis of SARS-CoV-2 by FDA under an Emergency Use Authorization (EUA). This EUA will remain in effect (meaning this test can be used) for the duration of the COVID-19 declaration under Section 564(b)(1) of the Act, 21 U.S.C. section 360bbb-3(b)(1), unless the authorization is terminated or revoked.  Performed at [facility], [address], [place], [place] [address]       Imaging Studies   CT HEAD WO CONTRAST ( )  Result Date: 09/21/2020 CLINICAL DATA:  Delirium EXAM: CT HEAD WITHOUT CONTRAST TECHNIQUE: Contiguous axial images were obtained from the base of the skull through the vertex without intravenous contrast. COMPARISON:  None. FINDINGS: Brain: No evidence of acute infarction, hemorrhage, hydrocephalus, extra-axial collection or mass lesion/mass effect. Global cortical and central atrophy.  Secondary ventriculomegaly. Subcortical white matter and periventricular small vessel ischemic changes. Old left basal ganglia lacunar infarct. Vascular: Intracranial atherosclerosis. Skull: Normal. Negative for fracture or focal lesion. Sinuses/Orbits: The visualized paranasal sinuses are essentially clear. The mastoid air cells are unopacified. Other: None. IMPRESSION: No evidence of acute intracranial abnormality. Left basal ganglia lacunar infarct. Atrophy with small vessel ischemic changes.  Electronically Signed   By: Charline Bills M.D.   On: 09/21/2020 00:13   DG Chest Portable 1 View  Result Date: 09/20/2020 CLINICAL DATA:  Altered  mental status and recent seizure activity EXAM: PORTABLE CHEST 1 VIEW COMPARISON:  07/13/2018 FINDINGS: Cardiac shadow is within normal limits. Aortic calcifications are seen. Mild left basilar atelectasis is noted. No sizable effusion is noted. No bony abnormality is seen. IMPRESSION: Mild left basilar atelectasis. Electronically Signed   By: Alcide Clever M.D.   On: 09/20/2020 23:13   EEG adult  Result Date: 09/21/2020 Charlsie Quest, MD     09/21/2020  1:01 PM Patient Name: Kelly Cole MRN: [id number] Epilepsy Attending: Charlsie Quest Referring Physician/Provider: Dr Brooke Dare Date: 09/21/2020 Duration: 22.02 mins Patient history: 80 year old woman with a past medical history significant for multiple sclerosis, suprapubic catheter recently exchanged, scoliosis, chronic pain, chronic benzodiazepine use, presenting with seizure-like activity and altered mental status. EEG to evaluate for seizure Level of alertness: Awake AEDs during EEG study: LEV Technical aspects: This EEG study was done with scalp electrodes positioned according to the 10-20 International system of electrode placement. Electrical activity was acquired at a sampling rate of 500Hz  and reviewed with a high frequency filter of 70Hz  and a low frequency filter of 1Hz . EEG data were recorded continuously and digitally stored. Description: No posterior dominant rhythm was seen.  EEG showed continuous generalized 3 to 6 Hz theta-delta slowing. Hyperventilation and photic stimulation were not performed.  Of note, study was technically difficult due to significant electrode artifact. ABNORMALITY - Continuous slow, generalized IMPRESSION: This technically difficult study is suggestive of moderate diffuse encephalopathy, nonspecific etiology. No seizures or epileptiform discharges were seen  throughout the recording. Priyanka     Medications   Scheduled Meds:  chlorhexidine  15 mL Mouth Rinse BID   Chlorhexidine Gluconate Cloth  6 each Topical Daily   enoxaparin (LOVENOX) injection  30 mg Subcutaneous Q24H   mouth rinse  15 mL Mouth Rinse q12n4p   sodium chloride flush  3 mL Intravenous Q12H   temazepam  30 mg Oral QHS   Continuous Infusions:  azithromycin 500 mg (09/22/20 1617)   cefTRIAXone (ROCEPHIN)  IV 1 g (09/22/20 1437)   lactated ringers with kcl     levETIRAcetam 500 mg (09/22/20 1132)       LOS: 1 day    Time spent: 30 minutes    09/24/20, DO [facility]  09/22/2020, 5:15 PM      If 7PM-7AM, please contact night-coverage. How to contact the [facility] Attending or Consulting provider 7A - 7P or covering provider during after hours 7P -7A, for this patient?    Check the care team in [facility] and look for a) attending/consulting TRH provider listed and b) the [facility] team listed Log into [email or web address] and use [place]'s universal password to access. If you do not have the password, please contact the hospital operator. Locate the [facility] provider you are looking for under [facility] and page to a number that you can be directly reached. If you still have difficulty reaching the provider, please page the [facility] (Director on Call) for the Hospitalists listed on amion for assistance.

## 2020-09-22 NOTE — Consult Note (Addendum)
WOC Nurse Consult Note: Reason for Consult: Consult requested for sacrum and right leg. Wound type: Sacrum with Stage 2 pressure injury; 2X2X.1cm, red and moist Right leg with several areas of dark purple bruises.  10% partail thickness abrasions to right anterior thigh; 2X.2X.1cm and .2X.1X.1cm, dark red and dry with 90% intact skin flap surrounding wound Pressure Injury POA: Yes Dressing procedure/placement/frequency: Topical treatment orders provided for bedside nurses to perform as follows to protect and promote healing: Foam dressing to sacrum and right leg, change Q 3 days or PRN soiling. Please re-consult if further assistance is needed.  Thank-you,  Cammie Mcgee MSN, RN, CWOCN, [place], CNS [phone]

## 2020-09-22 NOTE — Plan of Care (Signed)
  Problem: Clinical Measurements: Goal: Ability to maintain clinical measurements within normal limits will improve Outcome: Progressing Goal: Diagnostic test results will improve Outcome: Progressing Goal: Respiratory complications will improve Outcome: Progressing Goal: Cardiovascular complication will be avoided Outcome: Progressing   Problem: Nutrition: Goal: Adequate nutrition will be maintained Outcome: Progressing   Problem: Coping: Goal: Level of anxiety will decrease Outcome: Progressing   Problem: Elimination: Goal: Will not experience complications related to bowel motility Outcome: Progressing Goal: Will not experience complications related to urinary retention Outcome: Progressing   Problem: Pain Managment: Goal: General experience of comfort will improve Outcome: Progressing   Problem: Safety: Goal: Ability to remain free from injury will improve Outcome: Progressing

## 2020-09-23 ENCOUNTER — Inpatient Hospital Stay (HOSPITAL_COMMUNITY): Payer: PPO

## 2020-09-23 DIAGNOSIS — M24576 Contracture, unspecified foot: Secondary | ICD-10-CM

## 2020-09-23 DIAGNOSIS — Z7189 Other specified counseling: Secondary | ICD-10-CM

## 2020-09-23 DIAGNOSIS — Z66 Do not resuscitate: Secondary | ICD-10-CM

## 2020-09-23 DIAGNOSIS — I10 Essential (primary) hypertension: Secondary | ICD-10-CM

## 2020-09-23 DIAGNOSIS — G35 Multiple sclerosis: Secondary | ICD-10-CM

## 2020-09-23 DIAGNOSIS — Z515 Encounter for palliative care: Secondary | ICD-10-CM

## 2020-09-23 DIAGNOSIS — M24573 Contracture, unspecified ankle: Secondary | ICD-10-CM

## 2020-09-23 LAB — BASIC METABOLIC PANEL
Anion gap: 10 (ref 5–15)
BUN: <5 mg/dL — ABNORMAL LOW (ref 8–23)
CO2: 25 mmol/L (ref 22–32)
Calcium: 8.5 mg/dL — ABNORMAL LOW (ref 8.9–10.3)
Chloride: 98 mmol/L (ref 98–111)
Creatinine, Ser: 0.51 mg/dL (ref 0.44–1.00)
GFR, Estimated: >60 mL/min (ref >60)
Glucose, Bld: 179 mg/dL — ABNORMAL HIGH (ref 70–99)
Potassium: 3.5 mmol/L (ref 3.5–5.1)
Sodium: 133 mmol/L — ABNORMAL LOW (ref 135–145)

## 2020-09-23 LAB — CBC
HCT: 36.6 % (ref 36.0–46.0)
Hemoglobin: 11.5 g/dL — ABNORMAL LOW (ref 12.0–15.0)
MCH: 29.1 pg (ref 26.0–34.0)
MCHC: 31.4 g/dL (ref 30.0–36.0)
MCV: 92.7 fL (ref 80.0–100.0)
Platelets: 226 10*3/uL (ref 150–400)
RBC: 3.95 MIL/uL (ref 3.87–5.11)
RDW: 13.1 % (ref 11.5–15.5)
WBC: 15.6 10*3/uL — ABNORMAL HIGH (ref 4.0–10.5)
nRBC: 0 % (ref 0.0–0.2)

## 2020-09-23 LAB — URINE CULTURE: Culture: NO GROWTH

## 2020-09-23 LAB — MAGNESIUM: Magnesium: 1.3 mg/dL — ABNORMAL LOW (ref 1.7–2.4)

## 2020-09-23 MED ORDER — ENOXAPARIN SODIUM 40 MG/0.4ML IJ SOSY
40.0000 mg | PREFILLED_SYRINGE | INTRAMUSCULAR | Status: DC
  Administered 2020-09-23 – 2020-09-26 (×4): 40 mg via SUBCUTANEOUS
  Filled 2020-09-23 (×4): qty 0.4

## 2020-09-23 MED ORDER — DIVALPROEX SODIUM 250 MG PO DR TAB: 500.0000 mg | DELAYED_RELEASE_TABLET | Freq: Two times a day (BID) | ORAL | Status: DC

## 2020-09-23 MED ORDER — DIVALPROEX SODIUM 250 MG PO DR TAB
500.0000 mg | DELAYED_RELEASE_TABLET | Freq: Once | ORAL | Status: AC
  Administered 2020-09-23: 500 mg via ORAL
  Filled 2020-09-23: qty 2

## 2020-09-23 MED ORDER — VENLAFAXINE HCL 50 MG PO TABS
100.0000 mg | ORAL_TABLET | Freq: Three times a day (TID) | ORAL | Status: DC
  Administered 2020-09-23 – 2020-09-26 (×9): 100 mg via ORAL
  Filled 2020-09-23 (×12): qty 2

## 2020-09-23 MED ORDER — BUPROPION HCL 75 MG PO TABS
150.0000 mg | ORAL_TABLET | Freq: Three times a day (TID) | ORAL | Status: DC
  Administered 2020-09-23: 150 mg via ORAL
  Filled 2020-09-23 (×3): qty 2

## 2020-09-23 MED ORDER — MAGNESIUM SULFATE 4 GM/100ML IV SOLN
4.0000 g | Freq: Once | INTRAVENOUS | Status: AC
  Administered 2020-09-23: 4 g via INTRAVENOUS
  Filled 2020-09-23: qty 100

## 2020-09-23 MED ORDER — PROPRANOLOL HCL 40 MG PO TABS
40.0000 mg | ORAL_TABLET | Freq: Two times a day (BID) | ORAL | Status: DC
  Administered 2020-09-23 – 2020-09-26 (×7): 40 mg via ORAL
  Filled 2020-09-23 (×9): qty 1

## 2020-09-23 MED ORDER — DIVALPROEX SODIUM 125 MG PO CSDR
500.0000 mg | DELAYED_RELEASE_CAPSULE | Freq: Two times a day (BID) | ORAL | Status: DC
  Administered 2020-09-23 – 2020-09-26 (×7): 500 mg via ORAL
  Filled 2020-09-23 (×8): qty 4

## 2020-09-23 NOTE — Plan of Care (Signed)
  Problem: Education: Goal: Knowledge of General Education information will improve Description: Including pain rating scale, medication(s)/side effects and non-pharmacologic comfort measures Outcome: Progressing   Problem: Health Behavior/Discharge Planning: Goal: Ability to manage health-related needs will improve Outcome: Progressing   Problem: Clinical Measurements: Goal: Ability to maintain clinical measurements within normal limits will improve Outcome: Progressing Goal: Will remain free from infection Outcome: Progressing Goal: Respiratory complications will improve Outcome: Progressing Goal: Cardiovascular complication will be avoided Outcome: Progressing   Problem: Nutrition: Goal: Adequate nutrition will be maintained Outcome: Progressing   Problem: Elimination: Goal: Will not experience complications related to bowel motility Outcome: Progressing Goal: Will not experience complications related to urinary retention Outcome: Progressing   Problem: Pain Managment: Goal: General experience of comfort will improve Outcome: Progressing   Problem: Safety: Goal: Ability to remain free from injury will improve Outcome: Progressing   Problem: Skin Integrity: Goal: Risk for impaired skin integrity will decrease Outcome: Progressing

## 2020-09-23 NOTE — TOC Initial Note (Signed)
Transition of Care [facility]) - Initial/Assessment Note    Patient Details  Name: Kelly Cole MRN: [id number] Date of Birth: September 08, 1940  [facility] CM/SW Contact:    Lawerance Sabal, RN Phone Number: 09/23/2020, 4:19 PM  Clinical Narrative:         [name] w patient's spouse at bedside. Patient w hx MS, bedbound/ chairbound. He states that they have an aid through [employer] that stay with the patient from noon to 8pm daily. They do not identify at this time any additional DME needs, offered hoyer, however patient does not want one in the house, and spouse is agreeable to working around not having one.    Patient will need non emergency transport home.          Expected Discharge Plan: Home/Self Care Barriers to Discharge: Continued Medical Work up   Patient Goals and CMS Choice Patient states their goals for this hospitalization and ongoing recovery are:: to go home CMS Medicare.gov Compare Post Acute Care list provided to:: Other (Comment Required) Choice offered to / list presented to : Spouse  Expected Discharge Plan and Services Expected Discharge Plan: Home/Self Care   Discharge Planning Services: CM Consult   Living arrangements for the past 2 months: Skilled Nursing Facility                                      Prior Living Arrangements/Services Living arrangements for the past 2 months: Skilled Nursing Facility Lives with:: Spouse              Current home services: DME, Homehealth aide, Housekeeping    Activities of Daily Living Home Assistive Devices/Equipment: Wheelchair ADL Screening (condition at time of admission) Patient's cognitive ability adequate to safely complete daily activities?: No Is the patient deaf or have difficulty hearing?: No Does the patient have difficulty seeing, even when wearing glasses/contacts?: No Does the patient have difficulty concentrating, remembering, or making decisions?: Yes Patient able to express need  for assistance with ADLs?: Yes Does the patient have difficulty dressing or bathing?: Yes Independently performs ADLs?: No Communication: Needs assistance Dressing (OT): Dependent Is this a change from baseline?: Pre-admission baseline Grooming: Dependent Is this a change from baseline?: Pre-admission baseline Feeding: Dependent Is this a change from baseline?: Pre-admission baseline Bathing: Dependent Toileting: Dependent In/Out Bed: Dependent Walks in Home: Dependent Does the patient have difficulty walking or climbing stairs?: Yes Weakness of Legs: Left Weakness of Arms/Hands: Both  Permission Sought/Granted                  Emotional Assessment              Admission diagnosis:  Hypomagnesemia [E83.42] Seizure (HCC) [R56.9] Seizure-like activity (HCC) [R56.9] Patient Active Problem List   Diagnosis Date Noted   Pressure injury of skin 09/22/2020   Seizure (HCC) 09/21/2020   Seizure-like activity (HCC) 09/21/2020   Hypomagnesemia    Essential hypertension 05/05/2020   Essential tremor 05/05/2020   Mild recurrent major depression (HCC) 05/05/2020   Suprapubic catheter (HCC) 05/05/2020   Scoliosis (and kyphoscoliosis), idiopathic 04/16/2019   Spasticity 04/16/2019   Contracture of ankle and foot joint, unspecified laterality 04/16/2019   Degenerative disc disease, lumbar 03/19/2019   Multiple sclerosis (HCC) 03/19/2019   Scoliosis 03/19/2019   Tremor 02/08/2017   PCP:  Merlene Laughter, MD Pharmacy:   [id number] [place], [place] -  [address] AT [facility] [address] [facility] [address] [place] [address] Phone: [phone] Fax: [phone]     Social Determinants of Health (SDOH) Interventions    Readmission Risk Interventions No flowsheet data found.

## 2020-09-23 NOTE — Progress Notes (Signed)
PROGRESS NOTE    Kelly Cole   EXB:[id number]  DOB: [birth year]  PCP: Merlene Laughter, MD    DOA: 09/20/2020 LOS: 2   Assessment & Plan   Principal Problem:   Seizure (HCC) Active Problems:   Multiple sclerosis (HCC)   Scoliosis   Spasticity   Contracture of ankle and foot joint, unspecified laterality   Essential hypertension   Essential tremor   Mild recurrent major depression (HCC)   Suprapubic catheter (HCC)   Seizure-like activity (HCC)   Pressure injury of skin   Seizure-like activity - neurology consulted, loaded with Keppra. EEG showed only diffuse encephalopathy (obtained after benzos given).   -- Appreciate neurology recommendations --Continue Keppra 500 mg twice daily --Patient is no longer on baclofen, order Dc'd --Ativan as needed if seizure activity seen and to prevent withdrawal seizure (takes temazepam at home and currently unable to take p.o.) -- Treat for infection as below  Acute metabolic encephalopathy -patient was reportedly in a postictal state following her seizure-like activity according to husband and related reports of EMS.  Responding well and improving with IV antibiotics. --Continue treating infections as below --Delirium precautions --Keppra being changed to Depakote sprinkles due to agitation  Likely UTI -UA grossly positive.  Chronic suprapubic catheter. Clinically improving with antibiotics. -Urine culture negative --Continue Rocephin for PNA  Left lower lobe pneumonia -suspect aspiration.  Started on empiric CAP coverage and clinically improving.  She does not appear to have respiratory symptoms however. -- Continue Rocephin and Zithromax --Speech evaluation  Hypokalemia -replacing with LR +20 mEq Kcl + IVPB's x4 -- Monitor BMP and replace as needed  Hypomagnesemia -resolved with replacement. --Monitor replace as needed  Hyponatremia -mild, given clinical setting this is most likely due to lack of p.o. intake.  Improved with  IV fluids -- Continue LR, likely stop tomorrow  --Monitor BMP  Dysphagia -attempted bedside swallow with RN, failed.  Likely due to her altered mental status in addition to progressive MS. --SLP eval --Aspiration cautions --MBSS today  Primary progressive MS with spasticity - husband confirmed patient no longer takes baclofen.   DC baclofen order, do not resume on discharge.  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  Chronic suprapubic catheter. -- cont Myrbetriq   Depression/Anxiety - continued on home meds. Buspar d/c;d as it lowers seizure threshold   Hypertension -resume home amlodipine   Tremor -continue propanolol    Pressure injury of skin -POA Pressure Injury 09/22/20 Sacrum Stage 2 -  Partial thickness loss of dermis presenting as a shallow open injury with a red, pink wound bed without slough. (Active)  09/22/20   Location: Sacrum  Location Orientation:   Staging: Stage 2 -  Partial thickness loss of dermis presenting as a shallow open injury with a red, pink wound bed without slough.  Wound Description (Comments):   Present on Admission: Yes  --Continue wound care and frequent repositioning --Offload pressure areas much as possible   Patient BMI: Body mass index is 22.31 kg/m.   DVT prophylaxis: enoxaparin (LOVENOX) injection 40 mg Start: 09/23/20 1000   Diet:  Diet Orders (From admission, onward)     Start     Ordered   09/23/20 1410  DIET DYS 2 Room service appropriate? Yes with Assist; Fluid consistency: Nectar Thick  Diet effective now       Question Answer Comment  Room service appropriate? Yes with Assist   Fluid consistency: Nectar Thick      09/23/20 1409  Code Status: DNR   Brief Narrative / Hospital Course to Date:   Kelly Cole is a 80 y.o. female with medical history significant of multiple sclerosis, neurogenic bladder status post suprapubic catheter, scoliosis, ankle foot contracture, hypertension,  depression, tremor, chronic pain who presents after seizure-like activity at home.  Subjective 09/23/20    Patient seen with husband at bedside this morning.  She is feeling better and more talkative today.  She had some agitation overnight, possibly from Keppra.  Today pt reports she is tired.  No other acute complaints or events reported.    Disposition Plan & Communication   Status is: Inpatient  Remains inpatient appropriate because:IV treatments appropriate due to intensity of illness or inability to take PO  Dispo: The patient is from: Home              Anticipated d/c is to: Home              Patient currently is not medically stable to d/c.   Difficult to place patient No   Family Communication: Husband at bedside on rounds   Consults, Procedures, Significant Events   Consultants:  Neurology  Procedures:  EEG 8/14 -  Impression:technically difficult study is suggestive of moderate diffuse encephalopathy, nonspecific etiology. No seizures or epileptiform discharges were seen throughout the recording  Antimicrobials:  Anti-infectives (From admission, onward)    Start     Dose/Rate Route Frequency Ordered Stop   09/21/20 1245  azithromycin (ZITHROMAX) 500 mg in sodium chloride 0.9 % 250 mL IVPB        500 mg 250 mL/hr over 60 Minutes Intravenous Every 24 hours 09/21/20 1230     09/21/20 1230  cefTRIAXone (ROCEPHIN) 1 g in sodium chloride 0.9 % 100 mL IVPB        1 g 200 mL/hr over 30 Minutes Intravenous Every 24 hours 09/21/20 1229           Micro    Objective   Vitals:   09/23/20 0342 09/23/20 0916 09/23/20 1233 09/23/20 1636  BP:  (!) 146/88 (!) 168/59 (!) 135/57  Pulse:  98 100 76  Resp:  20 20 20   Temp: 98.5 F (36.9 C) 99 F (37.2 C) 99 F (37.2 C) 99.2 F (37.3 C)  TempSrc: Axillary Axillary Axillary Axillary  SpO2:  98% 99% 97%  Weight:      Height:        Intake/Output Summary (Last 24 hours) at 09/23/2020 1955 Last data filed at  09/23/2020 1639 Gross per 24 hour  Intake 240 ml  Output 400 ml  Net -160 ml   Filed Weights   09/20/20 2308  Weight: 59 kg    Physical Exam:  General exam: sitting up in bed, awake, alert, no acute distress, more interactive Respiratory system: Diminished left base but otherwise clear, no wheezes, normal respiratory effort. Cardiovascular system: normal S1/S2, RRR, no pedal edema.   Gastrointestinal system: soft, NT, ND Central nervous system: spasticity of extremities appears stable, speech more coherent today Genitourinary -suprapubic catheter in place, clear amber urine in bag Psychiatry: normal mood, congruent affect  Labs   Data Reviewed: I have personally reviewed following labs and imaging studies  CBC: Recent Labs  Lab 09/19/20 1902 09/19/20 2124 09/20/20 2250 09/20/20 2302 09/21/20 0403 09/22/20 0248 09/23/20 1150  WBC 7.3  --  11.7*  --  12.8* 12.5* 15.6*  NEUTROABS 5.0  --  10.0*  --   --   --   --  HGB 12.6   < > 13.1 15.3*  15.6* 10.6* 11.7* 11.5*  HCT 40.9   < > 42.9 45.0  46.0 33.8* 35.9* 36.6  MCV 93.6  --  94.5  --  92.9 90.2 92.7  PLT 250  --  321  --  209 223 226   < > = values in this interval not displayed.   Basic Metabolic Panel: Recent Labs  Lab 09/20/20 2250 09/20/20 2302 09/21/20 0403 09/22/20 0248 09/23/20 1150  NA 128* 127*  128* 130* 128* 133*  K 4.7 4.7  4.7 3.3* 2.8*  2.8* 3.5  CL 93* 92* 100 90* 98  CO2 21*  --  23 26 25   GLUCOSE 155* 157* 106* 105* 179*  BUN 9 8 6* <5* <5*  CREATININE 0.58 0.40* 0.38* 0.49 0.51  CALCIUM 9.3  --  7.7* 8.2* 8.5*  MG 1.4*  --  1.6* 1.8 1.3*   GFR: Estimated Creatinine Clearance: 49.2 mL/min (by C-G formula based on SCr of 0.51 mg/dL). Liver Function Tests: Recent Labs  Lab 09/21/20 0403  AST 17  ALT 13  ALKPHOS 43  BILITOT 0.8  PROT 4.8*  ALBUMIN 2.9*   No results for input(s): LIPASE, AMYLASE in the last 168 hours. No results for input(s): AMMONIA in the last 168  hours. Coagulation Profile: No results for input(s): INR, PROTIME in the last 168 hours. Cardiac Enzymes: No results for input(s): CKTOTAL, CKMB, CKMBINDEX, TROPONINI in the last 168 hours. BNP (last 3 results) No results for input(s): PROBNP in the last 8760 hours. HbA1C: No results for input(s): HGBA1C in the last 72 hours. CBG: Recent Labs  Lab 09/20/20 2256  GLUCAP 156*   Lipid Profile: No results for input(s): CHOL, HDL, LDLCALC, TRIG, CHOLHDL, LDLDIRECT in the last 72 hours. Thyroid Function Tests: No results for input(s): TSH, T4TOTAL, FREET4, T3FREE, THYROIDAB in the last 72 hours. Anemia Panel: No results for input(s): VITAMINB12, FOLATE, FERRITIN, TIBC, IRON, RETICCTPCT in the last 72 hours. Sepsis Labs: Recent Labs  Lab 09/21/20 0403  PROCALCITON <0.10    Recent Results (from the past 240 hour(s))  Urine Culture     Status: Abnormal   Collection Time: 09/19/20  7:02 PM   Specimen: Urine, Catheterized  Result Value Ref Range Status   Specimen Description   Final    URINE, CATHETERIZED Performed at [facility], [address] [place], [place] [address]    Special Requests   Final    NONE Performed at [facility], [address] [place], [place] [address]    Culture 80,000 COLONIES/mL PROTEUS MIRABILIS (A)  Final   Report Status 09/22/2020 FINAL  Final   Organism ID, Bacteria PROTEUS MIRABILIS (A)  Final      Susceptibility   Proteus mirabilis - MIC*    AMPICILLIN <=2 SENSITIVE Sensitive     CEFAZOLIN <=4 SENSITIVE Sensitive     CEFEPIME <=0.12 SENSITIVE Sensitive     CEFTRIAXONE <=0.25 SENSITIVE Sensitive     CIPROFLOXACIN <=0.25 SENSITIVE Sensitive     GENTAMICIN <=1 SENSITIVE Sensitive     IMIPENEM 2 SENSITIVE Sensitive     NITROFURANTOIN 128 RESISTANT Resistant     TRIMETH/SULFA <=20 SENSITIVE Sensitive     AMPICILLIN/SULBACTAM <=2 SENSITIVE Sensitive     PIP/TAZO <=4 SENSITIVE Sensitive     * 80,000  COLONIES/mL PROTEUS MIRABILIS  Resp Panel by RT-PCR (Flu A&B, Covid) Nasopharyngeal Swab     Status: None   Collection Time: 09/21/20  4:37 AM   Specimen: Nasopharyngeal Swab; Nasopharyngeal(NP) swabs in vial transport medium  Result Value Ref Range Status   SARS Coronavirus 2 by RT PCR NEGATIVE NEGATIVE Final    Comment: (NOTE) SARS-CoV-2 target nucleic acids are NOT DETECTED.  The SARS-CoV-2 RNA is generally detectable in upper respiratory specimens during the acute phase of infection. The lowest concentration of SARS-CoV-2 viral copies this assay can detect is 138 copies/mL. A negative result does not preclude SARS-Cov-2 infection and should not be used as the sole basis for treatment or other patient management decisions. A negative result may occur with  improper specimen collection/handling, submission of specimen other than nasopharyngeal swab, presence of viral mutation(s) within the areas targeted by this assay, and inadequate number of viral copies(<138 copies/mL). A negative result must be combined with clinical observations, patient history, and epidemiological information. The expected result is Negative.  Fact Sheet for Patients:  BloggerCourse.com  Fact Sheet for Healthcare Providers:  SeriousBroker.it  This test is no t yet approved or cleared by the [place] FDA and  has been authorized for detection and/or diagnosis of SARS-CoV-2 by FDA under an Emergency Use Authorization (EUA). This EUA will remain  in effect (meaning this test can be used) for the duration of the COVID-19 declaration under Section 564(b)(1) of the Act, 21 U.S.C.section 360bbb-3(b)(1), unless the authorization is terminated  or revoked sooner.       Influenza A by PCR NEGATIVE NEGATIVE Final   Influenza B by PCR NEGATIVE NEGATIVE Final    Comment: (NOTE) The Xpert Xpress SARS-CoV-2/FLU/RSV plus assay is intended as an aid in the  diagnosis of influenza from Nasopharyngeal swab specimens and should not be used as a sole basis for treatment. Nasal washings and aspirates are unacceptable for Xpert Xpress SARS-CoV-2/FLU/RSV testing.  Fact Sheet for Patients: BloggerCourse.com  Fact Sheet for Healthcare Providers: SeriousBroker.it  This test is not yet approved or cleared by the [place] FDA and has been authorized for detection and/or diagnosis of SARS-CoV-2 by FDA under an Emergency Use Authorization (EUA). This EUA will remain in effect (meaning this test can be used) for the duration of the COVID-19 declaration under Section 564(b)(1) of the Act, 21 U.S.C. section 360bbb-3(b)(1), unless the authorization is terminated or revoked.  Performed at [facility], [address] [place], [place] [address]   Urine Culture     Status: None   Collection Time: 09/22/20  7:50 AM   Specimen: Urine, Suprapubic  Result Value Ref Range Status   Specimen Description URINE, SUPRAPUBIC  Final   Special Requests NONE  Final   Culture   Final    NO GROWTH Performed at [facility], [address], [place], [place] [address]    Report Status 09/23/2020 FINAL  Final      Imaging Studies   DG Swallowing Func-Speech Pathology  Result Date: 09/23/2020 Table formatting from the original result was not included. Objective Swallowing Evaluation: Type of Study: MBS-Modified Barium Swallow Study  Patient Details Name: VERNEE BAINES MRN: [id number] Date of Birth: 09-Jan-1941 Today's Date: 09/23/2020 Time: SLP Start Time (ACUTE ONLY): 1329 -SLP Stop Time (ACUTE ONLY): 1350 SLP Time Calculation (min) (ACUTE ONLY): 21 min Past Medical History: Past Medical History: Diagnosis Date  HTN (hypertension)   Multiple sclerosis (HCC)  Past Surgical History: Past Surgical History: Procedure Laterality Date  ABDOMINAL HYSTERECTOMY    APPENDECTOMY    CHOLECYSTECTOMY    HYSTERCTOMY    HPI: Pt is  a 80 yo female admitted with AMS with concern for seizure activity and toxic/metabolic encephalopathy. CTH negative for acute changes. PMH: MS, HTN, scoliosis, chronic pain, cerebellar tumor, neurogenic bladder with suprapubic catheter  Subjective: alert, cooperative, needs cues Assessment / Plan / Recommendation CHL IP CLINICAL IMPRESSIONS 09/23/2020 Clinical Impression Pt has a mild oral dysphagia with prolonged mastication of solids, anterior spillage of liquids via cup, and decreased bolus cohesion. She tosses liquids back into her pharynx, allowing them to pool in her pyriform sinuses before swallowing. Despite this she maintaines good airway protection with nectar thick liquids (PAS 2), but thin liquids are sometimes spilled directly into the airway before the swallow and are silently aspirated (PAS 8). A wet vocal quality was only associated with a single instance of deep penetration of thin liquids but there was no change in vocal quality any time that she aspirated, nor was there any spontaneous throat clearing or coughing. Airway protection was not improved when attempting to change delivery method or use chin tuck. Recommend Dys 2 (finely chopped) diet and nectar thick liquids. SLP Visit Diagnosis Dysphagia, oropharyngeal phase (R13.12) Attention and concentration deficit following -- Frontal lobe and executive function deficit following -- Impact on safety and function Mild aspiration risk;Moderate aspiration risk   CHL IP TREATMENT RECOMMENDATION 09/23/2020 Treatment Recommendations Therapy as outlined in treatment plan below   Prognosis 09/23/2020 Prognosis for Safe Diet Advancement Good Barriers to Reach Goals -- Barriers/Prognosis Comment -- CHL IP DIET RECOMMENDATION 09/23/2020 SLP Diet Recommendations Dysphagia 2 (Fine chop) solids;Nectar thick liquid Liquid Administration via Straw Medication Administration Crushed with puree Compensations Minimize environmental distractions;Slow rate;Small  sips/bites;Follow solids with liquid Postural Changes Seated upright at 90 degrees;Remain semi-upright after after feeds/meals (Comment)   CHL IP OTHER RECOMMENDATIONS 09/23/2020 Recommended Consults -- Oral Care Recommendations Oral care BID Other Recommendations Prohibited food (jello, ice cream, thin soups);Remove water pitcher   CHL IP FOLLOW UP RECOMMENDATIONS 09/23/2020 Follow up Recommendations Home health SLP   CHL IP FREQUENCY AND DURATION 09/23/2020 Speech Therapy Frequency (ACUTE ONLY) min 2x/week Treatment Duration 2 weeks      CHL IP ORAL PHASE 09/23/2020 Oral Phase Impaired Oral - Pudding Teaspoon -- Oral - Pudding Cup -- Oral - Honey Teaspoon -- Oral - Honey Cup -- Oral - Nectar Teaspoon -- Oral - Nectar Cup -- Oral - Nectar Straw Decreased bolus cohesion Oral - Thin Teaspoon -- Oral - Thin Cup Decreased bolus cohesion Oral - Thin Straw Decreased bolus cohesion Oral - Puree WFL Oral - Mech Soft -- Oral - Regular Impaired mastication Oral - Multi-Consistency -- Oral - Pill -- Oral Phase - Comment --  CHL IP PHARYNGEAL PHASE 09/23/2020 Pharyngeal Phase Impaired Pharyngeal- Pudding Teaspoon -- Pharyngeal -- Pharyngeal- Pudding Cup -- Pharyngeal -- Pharyngeal- Honey Teaspoon -- Pharyngeal -- Pharyngeal- Honey Cup -- Pharyngeal -- Pharyngeal- Nectar Teaspoon -- Pharyngeal -- Pharyngeal- Nectar Cup -- Pharyngeal -- Pharyngeal- Nectar Straw Delayed swallow initiation-pyriform sinuses;Penetration/Aspiration during swallow Pharyngeal Material enters airway, remains ABOVE vocal cords then ejected out Pharyngeal- Thin Teaspoon -- Pharyngeal -- Pharyngeal- Thin Cup Delayed swallow initiation-pyriform sinuses;Penetration/Aspiration before swallow Pharyngeal Material enters airway, passes BELOW cords without attempt by patient to eject out (silent aspiration) Pharyngeal- Thin Straw Delayed swallow initiation-pyriform sinuses;Penetration/Aspiration before swallow Pharyngeal Material enters airway, passes BELOW cords  without attempt by patient to eject out (silent aspiration) Pharyngeal- Puree WFL Pharyngeal -- Pharyngeal- Mechanical Soft -- Pharyngeal -- Pharyngeal- Regular WFL Pharyngeal -- Pharyngeal- Multi-consistency -- Pharyngeal -- Pharyngeal- Pill -- Pharyngeal --  Pharyngeal Comment --  CHL IP CERVICAL ESOPHAGEAL PHASE 09/23/2020 Cervical Esophageal Phase WFL Pudding Teaspoon -- Pudding Cup -- Honey Teaspoon -- Honey Cup -- Nectar Teaspoon -- Nectar Cup -- Nectar Straw -- Thin Teaspoon -- Thin Cup -- Thin Straw -- Puree -- Mechanical Soft -- Regular -- Multi-consistency -- Pill -- Cervical Esophageal Comment -- Mahala MenghiniLaura N., M.A. CCC-SLP Acute Rehabilitation Services Pager [phone] Office [phone] 09/23/2020, 2:39 PM                Medications   Scheduled Meds:  acetaminophen  1,000 mg Oral BID AC   amLODipine  2.5 mg Oral QHS   chlorhexidine  15 mL Mouth Rinse BID   Chlorhexidine Gluconate Cloth  6 each Topical Daily   cholecalciferol  2,000 Units Oral TID   cholestyramine  4 g Oral BID AC   divalproex  500 mg Oral Q12H   enoxaparin (LOVENOX) injection  40 mg Subcutaneous Q24H   mouth rinse  15 mL Mouth Rinse q12n4p   mirabegron ER  50 mg Oral Daily   pantoprazole  40 mg Oral Daily   propranolol  40 mg Oral BID   QUEtiapine  75 mg Oral QHS   sodium chloride flush  3 mL Intravenous Q12H   temazepam  30 mg Oral QHS   venlafaxine  100 mg Oral TID WC   Continuous Infusions:  azithromycin 500 mg (09/23/20 1414)   cefTRIAXone (ROCEPHIN)  IV 1 g (09/23/20 1253)   lactated ringers with kcl 75 mL/hr at 09/23/20 1428   levETIRAcetam 500 mg (09/23/20 1102)       LOS: 2 days    Time spent: 30 minutes    Pennie BanterKelly A Isauro Skelley, DO [facility]  09/23/2020, 7:55 PM      If 7PM-7AM, please contact night-coverage. How to contact the [facility] Attending or Consulting provider 7A - 7P or covering provider during after hours 7P -7A, for this patient?    Check the care team in [facility] and look for  a) attending/consulting [facility] provider listed and b) the [facility] team listed Log into [email or web address] and use [place]'s universal password to access. If you do not have the password, please contact the hospital operator. Locate the [facility] provider you are looking for under [facility] and page to a number that you can be directly reached. If you still have difficulty reaching the provider, please page the [facility] (Director on Call) for the Hospitalists listed on amion for assistance.

## 2020-09-23 NOTE — Progress Notes (Addendum)
Neurology Progress Note  S: Feels fine this am. Feels more awake. No pain, n/v. Says she slept well last night.   More clarification on Seizure like activity event at home:  Per husband: 5 days prior to event, he noticed her having slight cognitive decline, such as not knowing how to use remote. On the night in question, they were eating around 6pm. Patient ate just a little, but seemed to be distracted with little speech. At 9pm, husband took her pills to her room and found patient with slow side to side eye movements. Her arms were across her lap and rigid. No shaking at that point. He called their NP neighbor, and she came right over. Husband gave this NP permission to speak to neighbor.   Per neighbor, she was called at 9pm by husband and went right over. When she arrived, she round patient in bed unresponsive. Shortly after that, her arms were flailing (with non purposeful movements) and that NP attempted to speak to patient about pain and inquired if she was seeing people or things. Patient only said no. NP found her eyes to be glancing around the room in very slow movements with a weird smile on her face and with eyes rolled back. This event lasted for 45-60 secs. Patient became sleepy after that. Then, patient started grinding her teeth with rigid arms again for a few seconds. By the time 911 was there, patient had a non focal gaze afterwards.   O: Current vital signs: BP (!) 110/56 (BP Location: Right Arm)   Pulse 97   Temp 98.5 F (36.9 C) (Axillary)   Resp 19   Ht 5\' 4"  (1.626 m)   Wt 59 kg   SpO2 98%   BMI 22.31 kg/m  Vital signs in last 24 hours: Temp:  [98.3 F (36.8 C)-98.8 F (37.1 C)] 98.5 F (36.9 C) (08/16 0342) Pulse Rate:  [54-102] 97 (08/15 2342) Resp:  [18-19] 19 (08/15 2342) BP: (110-168)/(56-90) 110/56 (08/15 2342) SpO2:  [96 %-100 %] 98 % (08/15 2342)  GENERAL: Chronically ill appearing. Awake, alert in NAD. HEENT: Normocephalic and atraumatic. LUNGS: Normal  respiratory effort.  CV: RRR. Ext: warm. Chronic contractures BLE and right hand. Mittens on.   NEURO:  Mental Status: Says hello and smiles when NP enters room. Alert oriented to name. Knows that 10[birth year]1984 is her husband. Thinks she is at examiner's house. Thinks examiner's name is Roxanne. Follows commands for wiggling toes, protruding tongue. States she needs to have a BM.  Speech/Language: speech is without aphasia or dysarthria.  Naming, repetition, fluency, and comprehension intact.  Cranial Nerves:  III, IV, VI: EOMI and crosses midline. She will track examiner from one side of bed to the other. Eyelids elevate symmetrically.  V: Facial sensation is intact and symmetrical. VII: Face is symmetric.   VIII: hearing intact to voice. Motor: BLE contractures (chronic). R hand contracture. Wiggles left toes on commands.   Labs: K and Na low yesterday. Labs pending today.    Medications  Current Facility-Administered Medications:    acetaminophen (TYLENOL) tablet 650 mg, 650 mg, Oral, Q6H PRN **OR** acetaminophen (TYLENOL) suppository 650 mg, 650 mg, Rectal, Q6H PRN, [name], MD, 650 mg at 09/22/20 09/24/20   acetaminophen (TYLENOL) tablet 1,000 mg, 1,000 mg, Oral, BID AC, 5916 A, DO   amLODipine (NORVASC) tablet 2.5 mg, 2.5 mg, Oral, QHS, Esaw Grandchild A, DO, 2.5 mg at 09/22/20 2152   azithromycin (ZITHROMAX) 500 mg in sodium chloride 0.9 %  250 mL IVPB, 500 mg, Intravenous, Q24H, Esaw Grandchild A, DO, Last Rate: 250 mL/hr at 09/22/20 1617, 500 mg at 09/22/20 1617   buPROPion (WELLBUTRIN XL) 24 hr tablet 450 mg, 450 mg, Oral, Daily, Esaw Grandchild A, DO, 450 mg at 09/22/20 2227   busPIRone (BUSPAR) tablet 10 mg, 10 mg, Oral, TID, Esaw Grandchild A, DO, 10 mg at 09/22/20 2152   cefTRIAXone (ROCEPHIN) 1 g in sodium chloride 0.9 % 100 mL IVPB, 1 g, Intravenous, Q24H, Griffith, Kelly A, DO, Last Rate: 200 mL/hr at 09/22/20 1437, 1 g at 09/22/20 1437   chlorhexidine (PERIDEX)  0.12 % solution 15 mL, 15 mL, Mouth Rinse, BID, Esaw Grandchild A, DO, 15 mL at 09/22/20 2152   Chlorhexidine Gluconate Cloth 2 % PADS 6 each, 6 each, Topical, Daily, Pennie Banter, DO, 6 each at 09/22/20 1610   cholecalciferol (VITAMIN D3) tablet 2,000 Units, 2,000 Units, Oral, TID, Esaw Grandchild A, DO, 2,000 Units at 09/22/20 2152   cholestyramine (QUESTRAN) packet 4 g, 4 g, Oral, BID AC, Esaw Grandchild A, DO   enoxaparin (LOVENOX) injection 30 mg, 30 mg, Subcutaneous, Q24H, Synetta Fail, MD, 30 mg at 09/22/20 1134   labetalol (NORMODYNE) injection 5 mg, 5 mg, Intravenous, Q4H PRN, Esaw Grandchild A, DO   lactated ringers 1,000 mL with potassium chloride 20 mEq infusion, , Intravenous, Continuous, Pennie Banter, DO, Last Rate: 75 mL/hr at 09/22/20 2236, New Bag at 09/22/20 2236   levETIRAcetam (KEPPRA) IVPB 500 mg/100 mL premix, 500 mg, Intravenous, Q12H, Bhagat, Srishti L, MD, Last Rate: 400 mL/hr at 09/22/20 2151, 500 mg at 09/22/20 2151   loperamide (IMODIUM) capsule 2 mg, 2 mg, Oral, TID PRN, Margo Aye, Carole N, DO   LORazepam (ATIVAN) injection 1 mg, 1 mg, Intravenous, Q4H PRN, Esaw Grandchild A, DO, 1 mg at 09/22/20 2143   LORazepam (ATIVAN) injection 2 mg, 2 mg, Intravenous, PRN, Esaw Grandchild A, DO   MEDLINE mouth rinse, 15 mL, Mouth Rinse, q12n4p, Esaw Grandchild A, DO, 15 mL at 09/22/20 2230   mirabegron ER (MYRBETRIQ) tablet 50 mg, 50 mg, Oral, Daily, Esaw Grandchild A, DO, 50 mg at 09/22/20 2229   pantoprazole (PROTONIX) EC tablet 40 mg, 40 mg, Oral, Daily, Esaw Grandchild A, DO, 40 mg at 09/22/20 2229   polyethylene glycol (MIRALAX / GLYCOLAX) packet 17 g, 17 g, Oral, Daily PRN, Synetta Fail, MD   propranolol ER (INDERAL LA) 24 hr capsule 80 mg, 80 mg, Oral, Daily, Esaw Grandchild A, DO, 80 mg at 09/22/20 2230   QUEtiapine (SEROQUEL) tablet 75 mg, 75 mg, Oral, QHS, Griffith, Kelly A, DO, 75 mg at 09/22/20 2152   sodium chloride flush (NS) 0.9 % injection 3 mL, 3  mL, Intravenous, Q12H, Synetta Fail, MD, 3 mL at 09/22/20 2231   temazepam (RESTORIL) capsule 30 mg, 30 mg, Oral, QHS, Synetta Fail, MD, 30 mg at 09/22/20 2152   venlafaxine XR (EFFEXOR-XR) 24 hr capsule 300 mg, 300 mg, Oral, Daily, Esaw Grandchild A, DO  No new Imaging  Assessment:  80 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 To prevent withdrawal while she was NPO, her Temazepam was changed to Ativan. She got her first dose of Temazepam last pm. She is much more alert and interactive today. No further visual hallucinations since 2 days ago. NP believes her delirium to be improving with the treatment of her metabolic derangements and infection. Differentials include behavioral disturbance  or syncope. However, gven the slow movements of her eyes associated with seizure like activity, and the LOC with stiffness and flailing of her extremities, this is likely a seizure. It is interesting that she had behavior/cognitive decline event 5 days prior to event, as that type of event can proceed a seizure.    Impression: -seizure  -Toxic metabolic encephalopathy due to infections and metabolic derangements.   Recommendations/Plan:  -Continue to treat infection and metabolic derangements as you are doing.  -After Dr. Amada Jupiter spoke to husband, husband was cautious of side effects of behavioral changes with Keppra, so AED changed to Depakote sprinkles 500mg  po q12 hours.  -Stop Buspirone because it lowers the seizure threshold.    -Delirium precautions.  -Avoid medications on the Beer's list for the elderly.  -Continue to avoid Ativan use except in a seizure lasting over 5 minutes.  -Neurology will be available prn for questions.  -Needs f/up with neurology at discharge. Referral placed.   Pt seen by , MSN, APN-BC/Nurse Practitioner/Neuro and later by MD. Note and plan to be edited as needed by MD.  Pager: Jimmye Norman   I  have seen the patient and reviewed the above note.  Husband is very cautious of possible psychiatric side effects of Keppra, and therefore I think it is reasonable to change to Depakote.  I suspect that her confusion has more to do with multifactorial delirium from her infection then related to the seizure.  If she does well on the Depakote, then no further recommendations will be made.  [phone], MD [facility] [phone]  If 7pm- 7am, please page neurology on call as listed in AMION.

## 2020-09-23 NOTE — Plan of Care (Signed)
  Problem: Pain Managment: Goal: General experience of comfort will improve Outcome: Progressing   Problem: Safety: Goal: Ability to remain free from injury will improve Outcome: Progressing   Problem: Skin Integrity: Goal: Risk for impaired skin integrity will decrease Outcome: Progressing   

## 2020-09-23 NOTE — Consult Note (Signed)
Palliative Care Consult Note                                  Date: 09/23/2020   Patient Name: Kelly Kelly Cole  DOB: Sep 17, 1940  MRN: [id number]  Age / Sex: 80 y.o., female  PCP: Kelly Laughter, MD Referring Physician: Pennie Banter, DO  Reason for Consultation: Establishing goals of care  HPI/Patient Profile: Palliative Care consult requested for goals of care discussion in this 80 y.o. female  with past medical history of scoliosis, ankle foot contractures, depression, tremors, chronic pain, hypertension, multiple sclerosis, and neurogenic bladder w/suprapubic catheter. She was admitted on 09/20/2020 from home via EMS with seizure-like activity.    Past Medical History:  Diagnosis Date   HTN (hypertension)    Multiple sclerosis (HCC)     Subjective:   This NP [place] reviewed medical records, received report from team, assessed the patient and then met at the patient's bedside with patient and her husband Kelly [place] to discuss diagnosis, prognosis, GOC, EOL wishes disposition and options.  Kelly [place] is awake and alert. She is smiling, able to follow simple commands and express wishes. Is alert to self and husband, however not at baseline per husband. No acute distress noted. Patient denies pain or shortness of breath.    Concept of Palliative Care was introduced as specialized medical care for people and their families living with serious illness.  It focuses on providing relief from the symptoms and stress of a serious illness.  The goal is to improve quality of life for both the patient and the family. Values and goals of care important to patient and family were attempted to be elicited. [place] understanding sharing patient was receiving outpatient palliative support previously [place] He states they [place] to [place] 4[place]5 [place] ago and he has been unable to re-instate support.  I created  space and opportunity for patient and husband to explore state of health prior to admission, thoughts, and feelings. [place] he has been patient's primary caregiver for the past 6-7 [place]. [place] worked as a [place] for many [place] prior to moving to [place]. She enjoys reading and took great pride in her physical appearance.   Patient has hired caregivers that come in daily from 12p-8pm to assist with care and ADLs. She has been wheelchair/bedbound for over 4 [place] due to her MS. Prior to admission she was able to take all medications by mouth and her appetite was good per husband. She would occasionally drink a protein shake during the day.   We discussed Her current illness and what it means in the larger context of Her on-going co-morbidities. Natural disease trajectory and expectations were discussed.  [place] verbalized understanding of current illness and co-morbidities. He [place] patient's quality of life prior to hospitalization was acceptable. He is concerned about the change in her cognition since seizure-like activity.   [place] is clear in expressed wishes to continue to treat the treatable allowing patient every opportunity to thrive. His goal is for her to eventually return home, however is open to rehab if recommended with consideration to her baseline functional status.   We discussed at length patients appetite and risk of aspiration. She is currently tolerating a dysphagia diet with some signs of coughing at times. We discussed risk of aspiration at length and patient's wishes in the setting of  poor nutrition and/or limitations.   I discussed the importance of continued conversation with family and their medical providers regarding overall plan of care and treatment options, ensuring decisions are within the context of the patients values and GOCs.  Questions and concerns were addressed. Husband was encouraged to call with questions or concerns.  PMT will continue to  support holistically as needed.  Life Review: Patient lives in the home with her husband, [place]. They do not have any children. Recently relocated to [place] approximately 4-5 [place] ago. Worked as a Copywriter, advertising and with special needs youth.    Patient Values: Enjoys reading, watching tv, and talking on the phone with her sisters and other family.    Objective:   Primary Diagnoses: Present on Admission:  Contracture of ankle and foot joint, unspecified laterality  Essential hypertension  Essential tremor  Mild recurrent major depression (HCC)  Multiple sclerosis (HCC)  Scoliosis  Spasticity   Scheduled Meds:  acetaminophen  1,000 mg Oral BID AC   amLODipine  2.5 mg Oral QHS   buPROPion  150 mg Oral TID   busPIRone  10 mg Oral TID   chlorhexidine  15 mL Mouth Rinse BID   Chlorhexidine Gluconate Cloth  6 each Topical Daily   cholecalciferol  2,000 Units Oral TID   cholestyramine  4 g Oral BID AC   divalproex  500 mg Oral Q12H   divalproex  500 mg Oral Once   enoxaparin (LOVENOX) injection  40 mg Subcutaneous Q24H   mouth rinse  15 mL Mouth Rinse q12n4p   mirabegron ER  50 mg Oral Daily   pantoprazole  40 mg Oral Daily   propranolol  40 mg Oral BID   QUEtiapine  75 mg Oral QHS   sodium chloride flush  3 mL Intravenous Q12H   temazepam  30 mg Oral QHS   venlafaxine  100 mg Oral TID WC    Continuous Infusions:  azithromycin 500 mg (09/22/20 1617)   cefTRIAXone (ROCEPHIN)  IV 1 g (09/22/20 1437)   lactated ringers with kcl 75 mL/hr at 09/22/20 2236   levETIRAcetam 500 mg (09/23/20 1102)    PRN Meds: acetaminophen **OR** acetaminophen, labetalol, loperamide, LORazepam, LORazepam, polyethylene glycol  Allergies  Allergen Reactions   Demerol  [Meperidine] Other (See Comments)    Review of Systems  Unable to perform ROS: Acuity of condition  Unless otherwise noted, a complete review of systems is negative.  Physical Exam General: NAD, awake  and alert Cardiovascular: regular rate and rhythm Pulmonary: diminished bilaterally  Abdomen: soft, nontender, + bowel sounds, suprapubic cath Extremities: no edema, contractures Skin: no rashes, warm and dry Neurological: awake, alert, alert to self and husband, follows commands, mood appropriate  Vital Signs:  BP (!) (P) 146/88 (BP Location: Right Arm)   Pulse (P) 98   Temp (P) 99 F (37.2 C) (Axillary)   Resp (P) 20   Ht $R'5\' 4"'EU$  (1.626 m)   Wt 59 kg   SpO2 (P) 98%   BMI 22.31 kg/m  Pain Scale: Faces   Pain Score: 0-No pain  SpO2: SpO2: (P) 98 % O2 Device:SpO2: (P) 98 % O2 Flow Rate: .O2 Flow Rate (L/min): 2 L/min  IO: Intake/output summary:  Intake/Output Summary (Last 24 hours) at 09/23/2020 1145 Last data filed at 09/22/2020 1800 Gross per 24 hour  Intake --  Output 800 ml  Net -800 ml    LBM: Last BM Date: 09/22/20 Baseline Weight: Weight:  59 kg Most recent weight: Weight: 59 kg      Palliative Assessment/Data:    Advanced Care Planning:   Primary Decision Maker: NEXT OF KIN-Husband   Code Status/Advance Care Planning: DNR  A discussion was had today regarding advanced directives. Concepts specific to code status, artifical feeding and hydration, continued IV antibiotics and rehospitalization was had.    Patient has a documented MOST form. Reviewed form with no requested updates by husband. He confirms wishes for no artificial feeding tubes/hydration and DNR. Wishes to treat the treatable.   The patient and family outlined wishes (per MOST) for the following treatment decisions:  Cardiopulmonary Resuscitation: Do Not Attempt Resuscitation (DNR/No CPR)  Medical Interventions: Limited Additional Interventions: Use medical treatment, IV fluids and cardiac monitoring as indicated, DO NOT USE intubation or mechanical ventilation. May consider use of less invasive airway support such as BiPAP or CPAP. Also provide comfort measures. Transfer to the hospital if  indicated. Avoid intensive care.   Antibiotics: Antibiotics if indicated  IV Fluids: IV fluids if indicated  Feeding Tube: No feeding tube    Palliative Care services outpatient were explained and offered. Husband verbalized understanding and awareness of both palliative and hospice's goals and philosophy of care. He would like to initiate palliative support outpatient.   Assessment & Plan:   SUMMARY OF RECOMMENDATIONS   DNR/DNI-as confirmed by husband MOST form reviewed. See above for selections Continue with current plan of care, treat the treatable Husband is remaining hopeful for some improvement/stability. No artificial feeding/PEG. Is open to outpatient therapy support if recommended with consideration of patient's baseline.  Outpatient palliative support at discharge. (TOC referral placed) PMT will continue to support and follow as needed. Please call team line with urgent needs.  Symptom Management:  Dysphagia-SLP following with recommendations (Dysphagia diet/nectar thick) Seizure like activity-Keppra as ordered, ativa PRN Insomnia-temazepam as ordered  Palliative Prophylaxis:  Aspiration, Bowel Regimen, Delirium Protocol, Frequent Pain Assessment, Oral Care, Palliative Wound Care, and Turn Reposition  Additional Recommendations (Limitations, Scope, Preferences): No Artificial Feeding and DNR/DNI, treat the treatable  Psycho-social/Spiritual:  Desire for further Chaplaincy support: no Additional Recommendations:  ongoing goals of care discussions   Prognosis:  Guarded   Discharge Planning:  To Be Determined    Husband, [place] expressed understanding and was in agreement with this plan.   Time In: 1010 Time Out: 1105 Time Total: 55 min.   Visit consisted of counseling and education dealing with the complex and emotionally intense issues of symptom management and palliative care in the setting of serious and potentially life-threatening illness.Greater than 50%  of  this time was spent counseling and coordinating care related to the above assessment and plan.  Signed by:  Alda Lea, AGPCNP-BC Palliative Medicine Team  Phone: [phone] Pager: [phone] Amion: Bjorn Pippin   Thank you for allowing the Palliative Medicine Team to assist in the care of this patient. Please utilize secure chat with additional questions, if there is no response within 30 minutes please call the above phone number. Palliative Medicine Team providers are available by phone from 7am to 5pm daily and can be reached through the team cell phone.  Should this patient require assistance outside of these hours, please call the patient's attending physician.

## 2020-09-23 NOTE — Progress Notes (Signed)
  Speech Language Pathology Treatment: Dysphagia  Patient Details Name: REGNIA MATHWIG MRN: [id number] DOB: 1940-05-07 Today's Date: 09/23/2020 Time: 1638-4665 SLP Time Calculation (min) (ACUTE ONLY): 9 min  Assessment / Plan / Recommendation Clinical Impression  Follow up with recommended po's and plan of treatment. Encountered eating with assist of RN tech with labial residue. Trial straw sip water resulted in immediate throat clear and a wetness to vocal quality. It is recommended she have instrumental evaluation with MBS in light of clinical observations and history of possible esophageal dysphagia, MS and admission for neuro changes. Mild confusion noted. Plan for MBS this afternoon if able- time not determined as of yet.    HPI HPI: Pt is a 80 yo female admitted with AMS with concern for seizure activity and toxic/metabolic encephalopathy. CTH negative for acute changes. PMH: MS, HTN, scoliosis, chronic pain, cerebellar tumor, neurogenic bladder with suprapubic catheter      SLP Plan  MBS       Recommendations  Diet recommendations: Dysphagia 1 (puree);Nectar-thick liquid Liquids provided via: Straw;Cup Medication Administration: Crushed with puree Supervision: Staff to assist with self feeding;Full supervision/cueing for compensatory strategies Compensations: Minimize environmental distractions;Slow rate;Small sips/bites;Follow solids with liquid Postural Changes and/or Swallow Maneuvers: Seated upright 90 degrees                Oral Care Recommendations: Oral care QID Follow up Recommendations:  (TBD) SLP Visit Diagnosis: Dysphagia, unspecified (R13.10) Plan: MBS                      Royce Macadamia 09/23/2020, 9:50 AM  Breck Coons Lonell Face.Ed Nurse, children's [phone] Office [phone]

## 2020-09-23 NOTE — Progress Notes (Signed)
Modified Barium Swallow Progress Note  Patient Details  Name: Kelly Cole MRN: [id number] Date of Birth: 22-Jan-1941  Today's Date: 09/23/2020  Modified Barium Swallow completed.  Full report located under Chart Review in the Imaging Section.  Brief recommendations include the following:  Clinical Impression  Pt has a mild oral dysphagia with prolonged mastication of solids, anterior spillage of liquids via cup, and decreased bolus cohesion. She tosses liquids back into her pharynx, allowing them to pool in her pyriform sinuses before swallowing. Despite this she maintaines good airway protection with nectar thick liquids (PAS 2), but thin liquids are sometimes spilled directly into the airway before the swallow and are silently aspirated (PAS 8). A wet vocal quality was only associated with a single instance of deep penetration of thin liquids but there was no change in vocal quality any time that she aspirated, nor was there any spontaneous throat clearing or coughing. Airway protection was not improved when attempting to change delivery method or use chin tuck. Recommend Dys 2 (finely chopped) diet and nectar thick liquids.   Swallow Evaluation Recommendations       SLP Diet Recommendations: Dysphagia 2 (Fine chop) solids;Nectar thick liquid   Liquid Administration via: Straw   Medication Administration: Crushed with puree   Supervision: Staff to assist with self feeding   Compensations: Minimize environmental distractions;Slow rate;Small sips/bites;Follow solids with liquid   Postural Changes: Seated upright at 90 degrees;Remain semi-upright after after feeds/meals (Comment)   Oral Care Recommendations: Oral care BID   Other Recommendations: Prohibited food (jello, ice cream, thin soups);Remove water pitcher    Mahala Menghini., M.A. CCC-SLP Acute Rehabilitation Services Pager [phone] Office [phone]  09/23/2020,2:37 PM

## 2020-09-24 DIAGNOSIS — M4140 Neuromuscular scoliosis, site unspecified: Secondary | ICD-10-CM

## 2020-09-24 LAB — BASIC METABOLIC PANEL
Anion gap: 6 (ref 5–15)
BUN: <5 mg/dL — ABNORMAL LOW (ref 8–23)
CO2: 29 mmol/L (ref 22–32)
Calcium: 8.4 mg/dL — ABNORMAL LOW (ref 8.9–10.3)
Chloride: 101 mmol/L (ref 98–111)
Creatinine, Ser: 0.42 mg/dL — ABNORMAL LOW (ref 0.44–1.00)
GFR, Estimated: >60 mL/min (ref >60)
Glucose, Bld: 136 mg/dL — ABNORMAL HIGH (ref 70–99)
Potassium: 3.1 mmol/L — ABNORMAL LOW (ref 3.5–5.1)
Sodium: 136 mmol/L (ref 135–145)

## 2020-09-24 LAB — CBC
HCT: 33.4 % — ABNORMAL LOW (ref 36.0–46.0)
Hemoglobin: 10.8 g/dL — ABNORMAL LOW (ref 12.0–15.0)
MCH: 29.7 pg (ref 26.0–34.0)
MCHC: 32.3 g/dL (ref 30.0–36.0)
MCV: 91.8 fL (ref 80.0–100.0)
Platelets: 189 10*3/uL (ref 150–400)
RBC: 3.64 MIL/uL — ABNORMAL LOW (ref 3.87–5.11)
RDW: 13 % (ref 11.5–15.5)
WBC: 10.2 10*3/uL (ref 4.0–10.5)
nRBC: 0 % (ref 0.0–0.2)

## 2020-09-24 LAB — MAGNESIUM: Magnesium: 2.7 mg/dL — ABNORMAL HIGH (ref 1.7–2.4)

## 2020-09-24 MED ORDER — PANTOPRAZOLE SODIUM 40 MG PO PACK: 40.0000 mg | PACK | Freq: Every day | ORAL | Status: DC

## 2020-09-24 MED ORDER — PANTOPRAZOLE SODIUM 40 MG PO PACK
40.0000 mg | PACK | Freq: Every day | ORAL | Status: DC
  Administered 2020-09-24 – 2020-09-26 (×3): 40 mg via ORAL
  Filled 2020-09-24 (×3): qty 20

## 2020-09-24 NOTE — Progress Notes (Addendum)
  Speech Language Pathology Treatment: Dysphagia  Patient Details Name: Kelly Cole MRN: [id number] DOB: 06/29/1940 Today's Date: 09/24/2020 Time: 7253-6644 SLP Time Calculation (min) (ACUTE ONLY): 21 min  Assessment / Plan / Recommendation Clinical Impression  Pt seen for skilled swallowing tx/education re: MBS results from previous date indicating Dysphagia 2/nectar-thickened liquids.  Pt/husband educated with compensatory swallowing strategies including: slow rate, small bites/sips, decrease environmental distractions, upright posture of 90 degrees during PO intake and follow solids with liquids. Also, initiated discussion of pt current state/performance oftentimes mirrors swallow efficiency.  Observed pt with consecutive swallows of nectar-thickened liquids via straw with adequate timing noted and good vocal quality post-swallow.  Informed husband to limit intake to 3 consecutive swallows for post-swallow exhalation/pacing d/t generalized weakness/dysphagia with agreement noted.   Also, instructed husband re: thickening liquids with simply thick gel packs (ie: 1 per 4 oz) during session.  Pt had consumed breakfast tray prior to SLP session, so she kindly declined solids this session.  ST f/u for full diet tolerance while in acute care and s/o if pt is progressing with current diet of Dysphagia 2/nectar-thickened liquids.   HPI HPI: Pt is a 80 yo female admitted with AMS with concern for seizure activity and toxic/metabolic encephalopathy. CTH negative for acute changes. PMH: MS, HTN, scoliosis, chronic pain, cerebellar tumor, neurogenic bladder with suprapubic catheter      SLP Plan  Continue with current plan of care       Recommendations  Diet recommendations: Dysphagia 2 (fine chop);Nectar-thick liquid Liquids provided via: Straw;Cup Medication Administration: Crushed with puree Supervision: Staff to assist with self feeding;Full supervision/cueing for compensatory  strategies Compensations: Minimize environmental distractions;Slow rate;Small sips/bites;Follow solids with liquid Postural Changes and/or Swallow Maneuvers: Seated upright 90 degrees                Oral Care Recommendations: Oral care QID Follow up Recommendations: Home health SLP SLP Visit Diagnosis: Dysphagia, oropharyngeal phase (R13.12) Plan: Continue with current plan of care                       Tressie Stalker, M.S., CCC-SLP 09/24/2020, 12:07 PM

## 2020-09-24 NOTE — Plan of Care (Signed)
  Problem: Clinical Measurements: Goal: Ability to maintain clinical measurements within normal limits will improve Outcome: Progressing Goal: Will remain free from infection Outcome: Progressing Goal: Diagnostic test results will improve Outcome: Progressing Goal: Respiratory complications will improve Outcome: Progressing Goal: Cardiovascular complication will be avoided Outcome: Progressing   Problem: Nutrition: Goal: Adequate nutrition will be maintained Outcome: Progressing   Problem: Elimination: Goal: Will not experience complications related to bowel motility Outcome: Progressing Goal: Will not experience complications related to urinary retention Outcome: Progressing   Problem: Pain Managment: Goal: General experience of comfort will improve Outcome: Progressing   Problem: Safety: Goal: Ability to remain free from injury will improve Outcome: Progressing   

## 2020-09-24 NOTE — Plan of Care (Signed)
  Problem: Clinical Measurements: Goal: Diagnostic test results will improve Outcome: Progressing Goal: Respiratory complications will improve Outcome: Progressing Goal: Cardiovascular complication will be avoided Outcome: Progressing   Problem: Activity: Goal: Risk for activity intolerance will decrease Outcome: Progressing   

## 2020-09-24 NOTE — Progress Notes (Signed)
PIV consult: Noted pt has been removing IVs. Discussed with RN, To limit IV sticks, bedside nurse to enter consult before next dose of IV medication.

## 2020-09-24 NOTE — Care Management Important Message (Signed)
Important Message  Patient Details  Name: DARIAN ACE MRN: [id number] Date of Birth: 1940-09-02   Medicare Important Message Given:  Yes     Dorena Bodo 09/24/2020, 3:03 PM

## 2020-09-24 NOTE — Progress Notes (Signed)
   Daily Progress Note   Patient Name: Kelly Cole       Date: 09/24/2020 DOB: 1940-03-10  Age: 80 y.o. MRN#: [id number] Attending Physician: Tyrone Nine, MD Primary Care Physician: Merlene Laughter, MD Admit Date: 09/20/2020  Reason for Consultation/Follow-up: Establishing goals of care  Subjective: Chart Reviewed. Updates Received. Patient Assessed.   Patient is awake and alert. Denies pain or shortness of breath. Husband at the bedside. SLP also at the bedside during follow-up visit providing support and education to patient and husband regarding nectar thick liquids and dysphagia diet. Patient observed drinking nectar thick water without complications.   Husband expressed appreciation of patient's continued improvement and near baseline. We reviewed goals of care. Husband and patient clear in expressed wishes for outpatient palliative support at discharge and for patient to return home with home health support.   All questions answered and support provided.   Length of Stay: 3 days  Vital Signs: BP 116/86 (BP Location: Right Arm)   Pulse 80   Temp 98.2 F (36.8 C) (Oral)   Resp 20   Ht 5\' 4"  (1.626 m)   Wt 58.9 kg   SpO2 100%   BMI 22.28 kg/m  SpO2: SpO2: 100 % O2 Device: O2 Device: Room Air O2 Flow Rate: O2 Flow Rate (L/min): 2 L/min  Physical Exam: Awake and alert, NAD RRR Diminished bilaterally Lower extremity contracture  AAO, mood appropriate                Palliative Care Assessment & Plan   Code Status: DNR  Goals of Care/Recommendations: Continue with current plan of care per attending' Patient and husband hopeful for continued improvement with a goal of returning home with home health and hired caregivers Request for outpatient palliative support at discharge (TOC orders placed on yesterday to assist with arranging) PMT will continue to support and follow as needed. Goals are set, please call with urgent needs.   Prognosis: Guarded  Discharge  Planning: Home with Home Health and Outpatient Palliative support   Thank you for allowing the Palliative Medicine Team to assist in the care of this patient.  Time Total: 35 min.   Visit consisted of counseling and education dealing with the complex and emotionally intense issues of symptom management and palliative care in the setting of serious and potentially life-threatening illness.Greater than 50%  of this time was spent counseling and coordinating care related to the above assessment and plan.  , AGPCNP-BC  Palliative Medicine Team [phone]

## 2020-09-24 NOTE — Progress Notes (Signed)
Husband at bedside, concerned about patient's agitation and need for ativan when pt discharges home, informed MD. Husband also concerned about pt coughing as he "feels it is worsening", MD aware. Husband also expressed concern about abilities for care for pt at home with current caretakers especially due to patient agitation. Patient was much more agitated and anxious today pulling at lines, crying and getting frustrated with husband and ativan was given at 1642.

## 2020-09-24 NOTE — Progress Notes (Signed)
Neurology Progress Note  Brief HPI: 80 y.o. female with PMHx of MS, HTN, scoliosis, chronic pain, cerebellar tumor, neurogenic bladder with suprapubic catheter, though she also voids with spurapubic catheter exchange 09/19/2020 who presented to the ED for evaluation of AMS after husband found her unresponsive with roving eye movements and restless movements of her extremities without clear purpose. Her neighbor who is an NP was called and witnessed patient to have roving eye movements with an abnormal facial expression and eyes with a backwards roll for 45-60 seconds with subsequent lethargy. Shortly after she was witnessed grinding her teeth with rigid arms and a non-focal gaze. After her second witnessed episode, EMS was activated due to concern for seizures.   Subjective: No acute overnight events noted Patient alert and interactive on examination today. Denies pain. States that she is "in and out of it this morning"  Exam: Vitals:   09/24/20 0004 09/24/20 0342  BP: 104/66 (!) 139/59  Pulse: 67 70  Resp: 19 17  Temp: 97.7 F (36.5 C) 98.4 F (36.9 C)  SpO2: 97% 99%   Gen: Chronically ill appearing female, laying comfortably in bed, in no acute distress Resp: non-labored breathing, no respiratory distress, SpO2 97% on monitor, on room air Abd: soft, non-tender, non-distended  Neuro: Mental Status: Awake, alert to self. States incorrectly that her age is 58, the year is 11, the month is December, and that she is in a doctor's office. She is unable to provide a clear and coherent history of present illness.  Follows simple commands consistently Poor attention noted with tangential speech that is at times unintelligible. Speech is dysarthric.  No aphasia or neglect noted.  Cranial Nerves: PERRL, EOMI without ptosis, facial sensation to light touch is intact and symmetric, face is symmetric resting and smiling, hearing is intact to voice, shoulders elevate symmetrically, palate elevates  symmetrically, patient is hypophonic, tongue protrudes midline. Motor: BLE with contractures without antigravity movement. She is able to wiggle toes on command. BUE with antigravity movement without vertical drift on assessment.  Left upper extremity with brief, coarse tremor with extremity elevation. Bulk is significantly decreased throughout.  Sensory: Sensation to light touch is intact and symmetric in bilateral upper and lower extremities.  DTR: 3+ and symmetric bilateral patellae and biceps Gait: Deferred  Pertinent Labs: CBC    Component Value Date/Time   WBC 10.2 09/24/2020 0141   RBC 3.64 (L) 09/24/2020 0141   HGB 10.8 (L) 09/24/2020 0141   HCT 33.4 (L) 09/24/2020 0141   PLT 189 09/24/2020 0141   MCV 91.8 09/24/2020 0141   MCH 29.7 09/24/2020 0141   MCHC 32.3 09/24/2020 0141   RDW 13.0 09/24/2020 0141   LYMPHSABS 1.0 09/20/2020 2250   MONOABS 0.5 09/20/2020 2250   EOSABS 0.1 09/20/2020 2250   BASOSABS 0.1 09/20/2020 2250   CMP     Component Value Date/Time   NA 136 09/24/2020 0141   K 3.1 (L) 09/24/2020 0141   CL 101 09/24/2020 0141   CO2 29 09/24/2020 0141   GLUCOSE 136 (H) 09/24/2020 0141   BUN <5 (L) 09/24/2020 0141   CREATININE 0.42 (L) 09/24/2020 0141   CALCIUM 8.4 (L) 09/24/2020 0141   PROT 4.8 (L) 09/21/2020 0403   ALBUMIN 2.9 (L) 09/21/2020 0403   AST 17 09/21/2020 0403   ALT 13 09/21/2020 0403   ALKPHOS 43 09/21/2020 0403   BILITOT 0.8 09/21/2020 0403   GFRNONAA >60 09/24/2020 0141   GFRAA 52 (L) 07/13/2019 1832  Lab Results  Component Value Date   VITAMINB12 >2,000 (H) 09/22/2018   Urinalysis    Component Value Date/Time   COLORURINE YELLOW 09/21/2020 0403   APPEARANCEUR CLOUDY (A) 09/21/2020 0403   LABSPEC 1.016 09/21/2020 0403   PHURINE 7.0 09/21/2020 0403   GLUCOSEU 150 (A) 09/21/2020 0403   HGBUR NEGATIVE 09/21/2020 0403   BILIRUBINUR NEGATIVE 09/21/2020 0403   KETONESUR 5 (A) 09/21/2020 0403   PROTEINUR 30 (A) 09/21/2020 0403    NITRITE POSITIVE (A) 09/21/2020 0403   LEUKOCYTESUR MODERATE (A) 09/21/2020 0403   Imaging Reviewed:  CT Head 09/20/2020: - No evidence of acute intracranial abnormality. - Left basal ganglia lacunar infarct. Atrophy with small vessel ischemic changes.  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 To prevent withdrawal while she was NPO, her Temazepam was changed to Ativan during inpatient hospitalization. She got her first dose of Temazepam 09/22/2020 PM with improvement in alertness and interaction with examiner 09/23/2020. No further visual hallucinations since 09/21/2020. It appears that her encephalopathy is improving with the treatment of her metabolic derangements and infection. Differentials include behavioral disturbance or syncope. However, gven the slow movements of her eyes associated with seizure like activity, and the LOC with stiffness and flailing of her extremities, her presentation is felt to be most consistent with seizure. It is interesting that she had behavior/cognitive decline event 5 days prior to event, as that type of event can proceed a seizure.   Impression:  - Concern for seizure  - Toxic metabolic encephalopathy; likely multifactorial- improving  Recommendations:  - Continue to treat infection and metabolic derangements as you are - Continue Depakote sprinkles 500mg  po q12 hours  - Discontinue Wellbutrin as it lowers the seizure threshold  - Continue delirium precautions  - Avoid medications on the Beer's list for the elderly  - Continue to avoid Ativan use except in a seizure lasting over 5 minutes  - Needs f/up with neurology at discharge. Referral placed.  - Neurology will be available prn for questions  , AGACNP-BC [facility] [phone]  I have seen the patient reviewed the above note.  I suspect that she has multifactorial delirium more related to her infections than the  initial seizure.  Given that she does have reason to have suspicion of seizure disorder with her underlying neurological disease, I do favor continued antiepileptic therapy and I favored Depakote given her history of psychiatric issues.  Neurology will be available on an as-needed basis moving forward.  [phone], MD [facility] [phone]  If 7pm- 7am, please page neurology on call as listed in [facility].

## 2020-09-24 NOTE — Progress Notes (Signed)
PROGRESS NOTE  Kelly Cole  RUE:[id number] DOB: [birth year] DOA: 09/20/2020 PCP: Merlene Laughter, MD   Brief Narrative: Kelly Cole is a 80 y.o. female with medical history significant of multiple sclerosis, neurogenic bladder status post suprapubic catheter, scoliosis, ankle foot contracture, hypertension, depression, tremor, chronic pain who presents after seizure-like activity at home. For suspicion of aspiration pneumonia, antibiotics were started. No further seizure episodes are noted. AED has been modified per neurology, recommending ongoing depakote and discontinuation of bupropion.   Assessment & Plan: Principal Problem:   Seizure (HCC) Active Problems:   Multiple sclerosis (HCC)   Scoliosis   Spasticity   Contracture of ankle and foot joint, unspecified laterality   Essential hypertension   Essential tremor   Mild recurrent major depression (HCC)   Suprapubic catheter (HCC)   Seizure-like activity (HCC)   Pressure injury of skin  Seizure-like activity - neurology consulted, loaded with Keppra. EEG showed only diffuse encephalopathy (obtained after benzos given).   - Continue depakote sprinkles, DC'ed keppra due to psychiatric concerns.  - DC bupropion - Treating infection   Acute metabolic encephalopathy -patient was reportedly in a postictal state following her seizure-like activity according to husband and related reports of EMS.  Responding well and improving with IV antibiotics. --Continue treating infections as below --Delirium precautions --Keppra being changed to Depakote sprinkles due to agitation   UTI ruled out with negative culture. Chronic suprapubic catheter.   Left lower lobe aspiration pneumonia Pt does have cough - Continue SLP evaluations and follow recommendations.  - continue abx (day 5 of 5 will be 8/18)   Hypokalemia: Resolved. - DC IVF to minimize interventions   Hypomagnesemia -resolved with replacement.   Hyponatremia -mild, given  clinical setting this is most likely due to lack of p.o. intake.  Improved with IV fluids.   Dysphagia  - Continue aspiration precautions, Dys 2 diet w/nectar thickened liquids per SLP.    Primary progressive MS with spasticity  - Neurology f/u as outpatient --Palliative care consulted   Neurogenic bladder -due to MS.  Chronic suprapubic catheter. -- cont Myrbetriq    Depression/Anxiety  - Continue home medications with the exception of wellbutrin as above. In its place will be depakote which may also assist with mood stability.    Hypertension  - Continue amlodipine    Tremor  - Continue propanolol    Pressure injury of skin -POA Pressure Injury 09/22/20 Sacrum Stage 2 -  Partial thickness loss of dermis presenting as a shallow open injury with a red, pink wound bed without slough. (Active)  09/22/20   Location: Sacrum  Location Orientation:   Staging: Stage 2 -  Partial thickness loss of dermis presenting as a shallow open injury with a red, pink wound bed without slough.  Wound Description (Comments):   Present on Admission: Yes  --Continue wound care and frequent repositioning --Offload pressure areas much as possible  Code Status: DNR Family Communication: Husband at bedside Disposition Plan:  Status is: Inpatient  Remains inpatient appropriate because:IV treatments appropriate due to intensity of illness or inability to take PO  Dispo: The patient is from: Home              Anticipated d/c is to: Home               Patient currently is not medically stable to d/c.   Difficult to place patient No  Consultants:  Neurology  Procedures:  None  Antimicrobials: Ceftriaxone, azithromycin   Subjective:  No further seizures today. Just now starting to eat and drink a little better. Having outbursts of agitation improved with anxiety medications. Husband states home is not yet ready for her to come back.  Objective: Vitals:   09/24/20 0500 09/24/20 0726 09/24/20  1140 09/24/20 1522  BP:  103/90 116/86 (!) 141/71  Pulse:  75 80 60  Resp:  18 20 20   Temp:  98.1 F (36.7 C) 98.2 F (36.8 C) 97.6 F (36.4 C)  TempSrc:  Oral Oral Oral  SpO2:  99% 100% 99%  Weight: 58.9 kg     Height:        Intake/Output Summary (Last 24 hours) at 09/24/2020 1721 Last data filed at 09/24/2020 1000 Gross per 24 hour  Intake 2898.15 ml  Output 800 ml  Net 2098.15 ml   Filed Weights   09/20/20 2308 09/24/20 0500  Weight: 59 kg 58.9 kg    Gen: 80 y.o. female in no distress Pulm: Non-labored breathing room air. Clear to auscultation bilaterally/anteriorly. CV: Regular rate and rhythm. No murmur, rub, or gallop. No JVD, no pitting pedal edema. GI: Abdomen soft, non-tender, non-distended, with normoactive bowel sounds. No organomegaly or masses felt. Ext: Warm, dry. Spastic deformities noted. Skin: No new lesions noted on visualized skin. Neuro: Alert and oriented. No new focal neurological deficits. Psych: Judgement and insight appear marginal. Calm at this time.  Data Reviewed: I have personally reviewed following labs and imaging studies  CBC: Recent Labs  Lab 09/19/20 1902 09/19/20 2124 09/20/20 2250 09/20/20 2302 09/21/20 0403 09/22/20 0248 09/23/20 1150 09/24/20 0141  WBC 7.3  --  11.7*  --  12.8* 12.5* 15.6* 10.2  NEUTROABS 5.0  --  10.0*  --   --   --   --   --   HGB 12.6   < > 13.1 15.3*  15.6* 10.6* 11.7* 11.5* 10.8*  HCT 40.9   < > 42.9 45.0  46.0 33.8* 35.9* 36.6 33.4*  MCV 93.6  --  94.5  --  92.9 90.2 92.7 91.8  PLT 250  --  321  --  209 223 226 189   < > = values in this interval not displayed.   Basic Metabolic Panel: Recent Labs  Lab 09/20/20 2250 09/20/20 2302 09/21/20 0403 09/22/20 0248 09/23/20 1150 09/24/20 0141  NA 128* 127*  128* 130* 128* 133* 136  K 4.7 4.7  4.7 3.3* 2.8*  2.8* 3.5 3.1*  CL 93* 92* 100 90* 98 101  CO2 21*  --  23 26 25 29   GLUCOSE 155* 157* 106* 105* 179* 136*  BUN 9 8 6* <5* <5* <5*   CREATININE 0.58 0.40* 0.38* 0.49 0.51 0.42*  CALCIUM 9.3  --  7.7* 8.2* 8.5* 8.4*  MG 1.4*  --  1.6* 1.8 1.3* 2.7*   GFR: Estimated Creatinine Clearance: 49.2 mL/min (A) (by C-G formula based on SCr of 0.42 mg/dL (L)). Liver Function Tests: Recent Labs  Lab 09/21/20 0403  AST 17  ALT 13  ALKPHOS 43  BILITOT 0.8  PROT 4.8*  ALBUMIN 2.9*   No results for input(s): LIPASE, AMYLASE in the last 168 hours. No results for input(s): AMMONIA in the last 168 hours. Coagulation Profile: No results for input(s): INR, PROTIME in the last 168 hours. Cardiac Enzymes: No results for input(s): CKTOTAL, CKMB, CKMBINDEX, TROPONINI in the last 168 hours. BNP (last 3 results) No results for input(s): PROBNP in the last 8760 hours. HbA1C: No results for input(s): HGBA1C  in the last 72 hours. CBG: Recent Labs  Lab 09/20/20 2256  GLUCAP 156*   Lipid Profile: No results for input(s): CHOL, HDL, LDLCALC, TRIG, CHOLHDL, LDLDIRECT in the last 72 hours. Thyroid Function Tests: No results for input(s): TSH, T4TOTAL, FREET4, T3FREE, THYROIDAB in the last 72 hours. Anemia Panel: No results for input(s): VITAMINB12, FOLATE, FERRITIN, TIBC, IRON, RETICCTPCT in the last 72 hours. Urine analysis:    Component Value Date/Time   COLORURINE YELLOW 09/21/2020 0403   APPEARANCEUR CLOUDY (A) 09/21/2020 0403   LABSPEC 1.016 09/21/2020 0403   PHURINE 7.0 09/21/2020 0403   GLUCOSEU 150 (A) 09/21/2020 0403   HGBUR NEGATIVE 09/21/2020 0403   BILIRUBINUR NEGATIVE 09/21/2020 0403   KETONESUR 5 (A) 09/21/2020 0403   PROTEINUR 30 (A) 09/21/2020 0403   NITRITE POSITIVE (A) 09/21/2020 0403   LEUKOCYTESUR MODERATE (A) 09/21/2020 0403   Recent Results (from the past 240 hour(s))  Urine Culture     Status: Abnormal   Collection Time: 09/19/20  7:02 PM   Specimen: Urine, Catheterized  Result Value Ref Range Status   Specimen Description   Final    URINE, CATHETERIZED Performed at [facility], [address] [place], [place] [address]    Special Requests   Final    NONE Performed at [facility], [address] [place], [place] [address]    Culture 80,000 COLONIES/mL PROTEUS MIRABILIS (A)  Final   Report Status 09/22/2020 FINAL  Final   Organism ID, Bacteria PROTEUS MIRABILIS (A)  Final      Susceptibility   Proteus mirabilis - MIC*    AMPICILLIN <=2 SENSITIVE Sensitive     CEFAZOLIN <=4 SENSITIVE Sensitive     CEFEPIME <=0.12 SENSITIVE Sensitive     CEFTRIAXONE <=0.25 SENSITIVE Sensitive     CIPROFLOXACIN <=0.25 SENSITIVE Sensitive     GENTAMICIN <=1 SENSITIVE Sensitive     IMIPENEM 2 SENSITIVE Sensitive     NITROFURANTOIN 128 RESISTANT Resistant     TRIMETH/SULFA <=20 SENSITIVE Sensitive     AMPICILLIN/SULBACTAM <=2 SENSITIVE Sensitive     PIP/TAZO <=4 SENSITIVE Sensitive     * 80,000 COLONIES/mL PROTEUS MIRABILIS  Resp Panel by RT-PCR (Flu A&B, Covid) Nasopharyngeal Swab     Status: None   Collection Time: 09/21/20  4:37 AM   Specimen: Nasopharyngeal Swab; Nasopharyngeal(NP) swabs in vial transport medium  Result Value Ref Range Status   SARS Coronavirus 2 by RT PCR NEGATIVE NEGATIVE Final    Comment: (NOTE) SARS-CoV-2 target nucleic acids are NOT DETECTED.  The SARS-CoV-2 RNA is generally detectable in upper respiratory specimens during the acute phase of infection. The lowest concentration of SARS-CoV-2 viral copies this assay can detect is 138 copies/mL. A negative result does not preclude SARS-Cov-2 infection and should not be used as the sole basis for treatment or other patient management decisions. A negative result may occur with  improper specimen collection/handling, submission of specimen other than nasopharyngeal swab, presence of viral mutation(s) within the areas targeted by this assay, and inadequate number of viral copies(<138 copies/mL). A negative result must be combined with clinical observations, patient  history, and epidemiological information. The expected result is Negative.  Fact Sheet for Patients:  BloggerCourse.comhttps://www.fda.gov/media/152166/download  Fact Sheet for Healthcare Providers:  SeriousBroker.ithttps://www.fda.gov/media/152162/download  This test is no t yet approved or cleared by the [place] FDA and  has been authorized for detection and/or diagnosis of SARS-CoV-2 by FDA under an Emergency Use Authorization (EUA). This EUA will remain  in effect (meaning this test can be used) for the duration of the COVID-19 declaration under Section 564(b)(1) of the Act, 21 U.S.C.section 360bbb-3(b)(1), unless the authorization is terminated  or revoked sooner.       Influenza A by PCR NEGATIVE NEGATIVE Final   Influenza B by PCR NEGATIVE NEGATIVE Final    Comment: (NOTE) The Xpert Xpress SARS-CoV-2/FLU/RSV plus assay is intended as an aid in the diagnosis of influenza from Nasopharyngeal swab specimens and should not be used as a sole basis for treatment. Nasal washings and aspirates are unacceptable for Xpert Xpress SARS-CoV-2/FLU/RSV testing.  Fact Sheet for Patients: BloggerCourse.com  Fact Sheet for Healthcare Providers: SeriousBroker.it  This test is not yet approved or cleared by the [place] FDA and has been authorized for detection and/or diagnosis of SARS-CoV-2 by FDA under an Emergency Use Authorization (EUA). This EUA will remain in effect (meaning this test can be used) for the duration of the COVID-19 declaration under Section 564(b)(1) of the Act, 21 U.S.C. section 360bbb-3(b)(1), unless the authorization is terminated or revoked.  Performed at [facility], [address] [place], [place] [address]   Urine Culture     Status: None   Collection Time: 09/22/20  7:50 AM   Specimen: Urine, Suprapubic  Result Value Ref Range Status   Specimen Description URINE, SUPRAPUBIC  Final   Special Requests NONE  Final    Culture   Final    NO GROWTH Performed at [facility], [address] [place], [place] [address]    Report Status 09/23/2020 FINAL  Final      Radiology Studies: DG Swallowing Func-Speech Pathology  Result Date: 09/23/2020 Table formatting from the original result was not included. Objective Swallowing Evaluation: Type of Study: MBS-Modified Barium Swallow Study  Patient Details Name: DEBE ANFINSON MRN: [id number] Date of Birth: 1940-04-13 Today's Date: 09/23/2020 Time: SLP Start Time (ACUTE ONLY): 1329 -SLP Stop Time (ACUTE ONLY): 1350 SLP Time Calculation (min) (ACUTE ONLY): 21 min Past Medical History: Past Medical History: Diagnosis Date  HTN (hypertension)   Multiple sclerosis (HCC)  Past Surgical History: Past Surgical History: Procedure Laterality Date  ABDOMINAL HYSTERECTOMY    APPENDECTOMY    CHOLECYSTECTOMY    HYSTERCTOMY   HPI: Pt is a 80 yo female admitted with AMS with concern for seizure activity and toxic/metabolic encephalopathy. CTH negative for acute changes. PMH: MS, HTN, scoliosis, chronic pain, cerebellar tumor, neurogenic bladder with suprapubic catheter  Subjective: alert, cooperative, needs cues Assessment / Plan / Recommendation CHL IP CLINICAL IMPRESSIONS 09/23/2020 Clinical Impression Pt has a mild oral dysphagia with prolonged mastication of solids, anterior spillage of liquids via cup, and decreased bolus cohesion. She tosses liquids back into her pharynx, allowing them to pool in her pyriform sinuses before swallowing. Despite this she maintaines good airway protection with nectar thick liquids (PAS 2), but thin liquids are sometimes spilled directly into the airway before the swallow and are silently aspirated (PAS 8). A wet vocal quality was only associated with a single instance of deep penetration of thin liquids but there was no change in vocal quality any time that she aspirated, nor was there any spontaneous throat clearing or coughing. Airway protection was not  improved when attempting to change delivery method or use chin tuck. Recommend Dys 2 (finely chopped) diet and nectar thick liquids. SLP Visit Diagnosis Dysphagia, oropharyngeal phase (R13.12) Attention and concentration deficit following -- Frontal lobe and executive function deficit following -- Impact  on safety and function Mild aspiration risk;Moderate aspiration risk   CHL IP TREATMENT RECOMMENDATION 09/23/2020 Treatment Recommendations Therapy as outlined in treatment plan below   Prognosis 09/23/2020 Prognosis for Safe Diet Advancement Good Barriers to Reach Goals -- Barriers/Prognosis Comment -- CHL IP DIET RECOMMENDATION 09/23/2020 SLP Diet Recommendations Dysphagia 2 (Fine chop) solids;Nectar thick liquid Liquid Administration via Straw Medication Administration Crushed with puree Compensations Minimize environmental distractions;Slow rate;Small sips/bites;Follow solids with liquid Postural Changes Seated upright at 90 degrees;Remain semi-upright after after feeds/meals (Comment)   CHL IP OTHER RECOMMENDATIONS 09/23/2020 Recommended Consults -- Oral Care Recommendations Oral care BID Other Recommendations Prohibited food (jello, ice cream, thin soups);Remove water pitcher   CHL IP FOLLOW UP RECOMMENDATIONS 09/23/2020 Follow up Recommendations Home health SLP   CHL IP FREQUENCY AND DURATION 09/23/2020 Speech Therapy Frequency (ACUTE ONLY) min 2x/week Treatment Duration 2 weeks      CHL IP ORAL PHASE 09/23/2020 Oral Phase Impaired Oral - Pudding Teaspoon -- Oral - Pudding Cup -- Oral - Honey Teaspoon -- Oral - Honey Cup -- Oral - Nectar Teaspoon -- Oral - Nectar Cup -- Oral - Nectar Straw Decreased bolus cohesion Oral - Thin Teaspoon -- Oral - Thin Cup Decreased bolus cohesion Oral - Thin Straw Decreased bolus cohesion Oral - Puree WFL Oral - Mech Soft -- Oral - Regular Impaired mastication Oral - Multi-Consistency -- Oral - Pill -- Oral Phase - Comment --  CHL IP PHARYNGEAL PHASE 09/23/2020 Pharyngeal Phase  Impaired Pharyngeal- Pudding Teaspoon -- Pharyngeal -- Pharyngeal- Pudding Cup -- Pharyngeal -- Pharyngeal- Honey Teaspoon -- Pharyngeal -- Pharyngeal- Honey Cup -- Pharyngeal -- Pharyngeal- Nectar Teaspoon -- Pharyngeal -- Pharyngeal- Nectar Cup -- Pharyngeal -- Pharyngeal- Nectar Straw Delayed swallow initiation-pyriform sinuses;Penetration/Aspiration during swallow Pharyngeal Material enters airway, remains ABOVE vocal cords then ejected out Pharyngeal- Thin Teaspoon -- Pharyngeal -- Pharyngeal- Thin Cup Delayed swallow initiation-pyriform sinuses;Penetration/Aspiration before swallow Pharyngeal Material enters airway, passes BELOW cords without attempt by patient to eject out (silent aspiration) Pharyngeal- Thin Straw Delayed swallow initiation-pyriform sinuses;Penetration/Aspiration before swallow Pharyngeal Material enters airway, passes BELOW cords without attempt by patient to eject out (silent aspiration) Pharyngeal- Puree WFL Pharyngeal -- Pharyngeal- Mechanical Soft -- Pharyngeal -- Pharyngeal- Regular WFL Pharyngeal -- Pharyngeal- Multi-consistency -- Pharyngeal -- Pharyngeal- Pill -- Pharyngeal -- Pharyngeal Comment --  CHL IP CERVICAL ESOPHAGEAL PHASE 09/23/2020 Cervical Esophageal Phase WFL Pudding Teaspoon -- Pudding Cup -- Honey Teaspoon -- Honey Cup -- Nectar Teaspoon -- Nectar Cup -- Nectar Straw -- Thin Teaspoon -- Thin Cup -- Thin Straw -- Puree -- Mechanical Soft -- Regular -- Multi-consistency -- Pill -- Cervical Esophageal Comment -- Mahala Menghini., M.A. CCC-SLP Acute Rehabilitation Services Pager [phone] Office [phone] 09/23/2020, 2:39 PM               Scheduled Meds:  acetaminophen  1,000 mg Oral BID AC   amLODipine  2.5 mg Oral QHS   chlorhexidine  15 mL Mouth Rinse BID   Chlorhexidine Gluconate Cloth  6 each Topical Daily   cholecalciferol  2,000 Units Oral TID   cholestyramine  4 g Oral BID AC   divalproex  500 mg Oral Q12H   enoxaparin (LOVENOX) injection  40 mg  Subcutaneous Q24H   mouth rinse  15 mL Mouth Rinse q12n4p   pantoprazole sodium  40 mg Oral Daily   propranolol  40 mg Oral BID   QUEtiapine  75 mg Oral QHS   sodium chloride flush  3 mL Intravenous Q12H   temazepam  30 mg Oral QHS   venlafaxine  100 mg Oral TID WC   Continuous Infusions:  azithromycin 500 mg (09/24/20 1704)   cefTRIAXone (ROCEPHIN)  IV 1 g (09/24/20 1337)     LOS: 3 days   Time spent: 25 minutes.  Tyrone Nine, MD [facility] [email or web address] 09/24/2020, 5:21 PM

## 2020-09-25 ENCOUNTER — Inpatient Hospital Stay (HOSPITAL_COMMUNITY): Payer: PPO

## 2020-09-25 DIAGNOSIS — R569 Unspecified convulsions: Principal | ICD-10-CM

## 2020-09-25 MED ORDER — AMLODIPINE BESYLATE 5 MG PO TABS
5.0000 mg | ORAL_TABLET | Freq: Every day | ORAL | Status: DC
  Administered 2020-09-25: 5 mg via ORAL
  Filled 2020-09-25: qty 1

## 2020-09-25 MED ORDER — POTASSIUM CHLORIDE 20 MEQ PO PACK
40.0000 meq | PACK | Freq: Once | ORAL | Status: AC
  Administered 2020-09-25: 40 meq via ORAL
  Filled 2020-09-25: qty 2

## 2020-09-25 MED ORDER — BUSPIRONE HCL 10 MG PO TABS
10.0000 mg | ORAL_TABLET | Freq: Three times a day (TID) | ORAL | Status: DC
  Administered 2020-09-25 – 2020-09-26 (×3): 10 mg via ORAL
  Filled 2020-09-25 (×3): qty 1

## 2020-09-25 NOTE — Evaluation (Signed)
Occupational Therapy Evaluation Patient Details Name: Kelly Cole MRN: [id number] DOB: Jan 13, 1941 Today's Date: 09/25/2020    History of Present Illness 80 y.o. female with medical history significant of multiple sclerosis, neurogenic bladder status post suprapubic catheter, scoliosis, ankle foot contracture, hypertension, depression, tremor, chronic pain who presents after seizure-like activity at home.   Clinical Impression   Patient admitted for the above diagnosis.  PTA she lives with her spouse, and has a PCA each day from 12pm to 8 pm, and needs total assist for ADL (except feeding) and mobility.  Currently she is close to her baseline status, and plans on returning home with prior level of supports.  OT did recommend a soft adjustable resting hand splint for her R hand.  HH OT could be recommended to assist with this, unless the spouse is able to transport her to a local orthotic clinic.  Acute OT will see if they can obtain a splint prior to discharge.      Follow Up Recommendations  Home health OT    Equipment Recommendations  None recommended by OT    Recommendations for Other Services       Precautions / Restrictions Precautions Precautions: Fall Precaution Comments: B ankle contractures, and bedgining R hand contracture.  Incontinent of stool. Restrictions Weight Bearing Restrictions: Yes RLE Weight Bearing: Non weight bearing LLE Weight Bearing: Non weight bearing Other Position/Activity Restrictions: per spouse      Mobility Bed Mobility Overal bed mobility: Needs Assistance Bed Mobility: Supine to Sit;Sit to Supine     Supine to sit: Total assist Sit to supine: Total assist     Patient Response: Cooperative  Transfers                 General transfer comment: deferred - spouse states PCA has patient wrap arms around their neck, and they pick her up and transfer her to chair/toilet/wheelchair    Balance Overall balance assessment: Needs  assistance Sitting-balance support: Single extremity supported;Feet unsupported Sitting balance-Leahy Scale: Zero Sitting balance - Comments: able to support herself it grasping SR for a few seconds. Postural control: Posterior lean;Right lateral lean                                 ADL either performed or assessed with clinical judgement   ADL Overall ADL's : At baseline                                             Vision Patient Visual Report: No change from baseline       Perception     Praxis      Pertinent Vitals/Pain Pain Assessment: No/denies pain Pain Intervention(s): Monitored during session     Hand Dominance Right   Extremity/Trunk Assessment Upper Extremity Assessment Upper Extremity Assessment: Generalized weakness;RUE deficits/detail RUE Deficits / Details: mild contracture to R hand. RUE Sensation: decreased light touch RUE Coordination: decreased fine motor;decreased gross motor   Lower Extremity Assessment Lower Extremity Assessment: Defer to PT evaluation   Cervical / Trunk Assessment Cervical / Trunk Assessment: Other exceptions Cervical / Trunk Exceptions: scoliosis   Communication Communication Communication: Expressive difficulties   Cognition Arousal/Alertness: Awake/alert Behavior During Therapy: WFL for tasks assessed/performed Overall Cognitive Status: History of cognitive impairments - at baseline  General Comments: ST memory deficts noted.  Follow commands well, and makes needs known.   General Comments       Exercises     Shoulder Instructions      Home Living Family/patient expects to be discharged to:: Private residence Living Arrangements: Spouse/significant other;Non-relatives/Friends Available Help at Discharge: Family;Personal care attendant;Available 24 hours/day Type of Home: House Home Access: Level entry           Bathroom  Shower/Tub: Producer, television/film/video: Handicapped height Bathroom Accessibility: Yes How Accessible: Accessible via wheelchair Home Equipment: Bedside commode;Hand held shower head;Grab bars - tub/shower;Wheelchair - Geophysical data processor          Prior Functioning/Environment Level of Independence: Needs assistance  Gait / Transfers Assistance Needed: total assist for transfers, bed mobility and unable to push w/c ADL's / Homemaking Assistance Needed: total assist for ADL except feeding, unable to participate with home management, meals, medication mangement.  Total assist with community mobility.   Comments: progressive MS        OT Problem List: Decreased range of motion;Impaired balance (sitting and/or standing)      OT Treatment/Interventions:      OT Goals(Current goals can be found in the care plan section) Acute Rehab OT Goals Patient Stated Goal: I'd like to get up OT Goal Formulation: With patient Time For Goal Achievement: 10/09/20 Potential to Achieve Goals: Good  OT Frequency:     Barriers to D/C:  None noted          Co-evaluation              AM-PAC OT "6 Clicks" Daily Activity     Outcome Measure Help from another person eating meals?: A Little Help from another person taking care of personal grooming?: A Lot Help from another person toileting, which includes using toliet, bedpan, or urinal?: Total Help from another person bathing (including washing, rinsing, drying)?: Total Help from another person to put on and taking off regular upper body clothing?: A Lot Help from another person to put on and taking off regular lower body clothing?: Total 6 Click Score: 10   End of Session    Activity Tolerance: Patient tolerated treatment well Patient left: in bed;with call bell/phone within reach;with bed alarm set;with family/visitor present  OT Visit Diagnosis: Muscle weakness (generalized) (M62.81)                Time: 1610-9604 OT Time  Calculation (min): 25 min Charges:  OT General Charges $OT Visit: 1 Visit OT Evaluation $OT Eval Moderate Complexity: 1 Mod OT Treatments $Therapeutic Activity: 8-22 mins  09/25/2020  Rich, OTR/L  Acute Rehabilitation Services  Office:  [phone]   Suzanna Obey 09/25/2020, 4:06 PM

## 2020-09-25 NOTE — Progress Notes (Signed)
Patient PROGRESS NOTE    KALLIOPI COUPLAND  ASN:[id number] DOB: 06-13-1940 DOA: 09/20/2020 PCP: Merlene Laughter, MD   Chief Complain: Seizure-like activity  Brief Narrative:  Patient 80 year old female with history of multiple sclerosis, neurogenic bladder s/p suprapubic catheter placement, scoliosis, hypertension, depression, tremor, chronic pain syndrome who presented from home with seizure-like activity.  On presentation she was also  suspected to have aspiration pneumonia and antibiotics were started.  Neurology was consulted and adjusted AED medications. No further episodes of seizures thereafter. Hospital course remarkable for persistent agitation, confusion preventing discharge.  As per request of the husband, we consulted PT/OT/psychiatric evaluation today.  We will goal is to go to home with home health.  Palliaitve care was also following during this hospitalization.  As per husband's request, we requested PT/OT/psychiatric evaluation today.  Assessment & Plan:   Principal Problem:   Seizure (HCC) Active Problems:   Multiple sclerosis (HCC)   Scoliosis   Spasticity   Contracture of ankle and foot joint, unspecified laterality   Essential hypertension   Essential tremor   Mild recurrent major depression (HCC)   Suprapubic catheter (HCC)   Seizure-like activity (HCC)   Pressure injury of skin   Seizure-like activity: Neurology consulted, loaded with Keppra on presentation.  EEG showed only diffuse encephalopathy, no seizures.  Started on Depakote now.  Keppra discontinued due to psychiatric concerns, bupropion also discontinued.  Acute metabolic encephalopathy: Noticed to be in postictal state following her seizure-like activity.  Also could have been contributed due to possible aspiration pneumonia.  Monitor mental status.  Hospital course remarkable for agitation, confusion.  She has behavioral disturbance.  Suspected UTI: Urine culture did not show any growth.  She has chronic  suprapubic catheter.  Left lobe aspiration pneumonia: She was  coughing.  Speech therapy following.  She will complete 5 days of antibiotics on 8/18.  She has history of dysphagia.  On dysphagia 2 diet.  Currently respiratory status is stable, on room air  Hypokalemia/hypomagnesemia: Being monitored and supplemented.  History of progressive multiple sclerosis with spasticity: She needs to follow-up with neurology as an outpatient.  She is wheelchair dependent, cannot bear weight on her lower extremities.  We have requested PT/OT evaluation  Neurogenic bladder: Currently on suprapubic catheter.  On Myrbetriq  Depression/anxiety: She was taking bupropion ,buspirone at home,currently on hold. Also takes on Seroquel, Effexor, Restoril,now resumed.  Psychiatry has been consulted.  Hypertension: Blood pressure on the higher side today.  Continue amlodipine ,dose increased to 5 mg.continue monitoring  Tremor: On propanolol.  Goals of care: Elderly patient with multiple comorbidities.  Palliative care was consulted during this hospitalization.  CODE STATUS is DNR.  Goal is to return home with home health and hired caregivers.  Palliative care recommended outpatient follow-up at discharge.  Pressure Injury 09/22/20 Sacrum Stage 2 -  Partial thickness loss of dermis presenting as a shallow open injury with a red, pink wound bed without slough. (Active)  09/22/20   Location: Sacrum  Location Orientation:   Staging: Stage 2 -  Partial thickness loss of dermis presenting as a shallow open injury with a red, pink wound bed without slough.  Wound Description (Comments):   Present on Admission: Yes             Nutrition Problem: Inadequate oral intake Etiology: inability to eat      DVT prophylaxis:Lovenox Code Status: DnR Family Communication: Long discussion held with the husband at bedside Status is: Inpatient  Remains inpatient appropriate  because:Unsafe d/c plan  Dispo: The  patient is from: Home              Anticipated d/c is to: Home              Patient currently is not medically stable to d/c.   Difficult to place patient No   Consultants: Neurology,psychiatry  Procedures:None  Antimicrobials:  Anti-infectives (From admission, onward)    Start     Dose/Rate Route Frequency Ordered Stop   09/21/20 1245  azithromycin (ZITHROMAX) 500 mg in sodium chloride 0.9 % 250 mL IVPB        500 mg 250 mL/hr over 60 Minutes Intravenous Every 24 hours 09/21/20 1230     09/21/20 1230  cefTRIAXone (ROCEPHIN) 1 g in sodium chloride 0.9 % 100 mL IVPB        1 g 200 mL/hr over 30 Minutes Intravenous Every 24 hours 09/21/20 1229         Subjective:  Patient seen and examined at the bedside this morning.  Overall comfortable during my evaluation, sitting on the bed.  Alert, awake, not agitated but is confused.  Husband at the bedside with concern for her behavioral disturbance.  He states she is not back to her baseline.   Objective: Vitals:   09/24/20 2030 09/25/20 0057 09/25/20 0436 09/25/20 0439  BP: (!) 152/68 136/62 (!) 169/66   Pulse: 76 71 75   Resp: 18 20 20    Temp: 98.2 F (36.8 C) 98 F (36.7 C) 97.8 F (36.6 C)   TempSrc: Oral Axillary Axillary   SpO2: 99% 99% 97%   Weight:    57.2 kg  Height:        Intake/Output Summary (Last 24 hours) at 09/25/2020 0757 Last data filed at 09/25/2020 0600 Gross per 24 hour  Intake 3018.15 ml  Output 1500 ml  Net 1518.15 ml   Filed Weights   09/20/20 2308 09/24/20 0500 09/25/20 0439  Weight: 59 kg 58.9 kg 57.2 kg    Examination:  General exam:  not in distress.  Very deconditioned, chronically ill looking, debilitated, elderly, pleasantly confused HEENT: PERRL Respiratory system:  no wheezes or crackles  Cardiovascular system: S1 & S2 heard, RRR.  Gastrointestinal system: Abdomen is nondistended, soft and nontender. Central nervous system: Alert and awake but not oriented, lower extremity  weakness Extremities: No edema, no clubbing ,no cyanosis Skin: No rashes, no icterus, suprapubic catheter    Data Reviewed: I have personally reviewed following labs and imaging studies  CBC: Recent Labs  Lab 09/19/20 1902 09/19/20 2124 09/20/20 2250 09/20/20 2302 09/21/20 0403 09/22/20 0248 09/23/20 1150 09/24/20 0141  WBC 7.3  --  11.7*  --  12.8* 12.5* 15.6* 10.2  NEUTROABS 5.0  --  10.0*  --   --   --   --   --   HGB 12.6   < > 13.1 15.3*  15.6* 10.6* 11.7* 11.5* 10.8*  HCT 40.9   < > 42.9 45.0  46.0 33.8* 35.9* 36.6 33.4*  MCV 93.6  --  94.5  --  92.9 90.2 92.7 91.8  PLT 250  --  321  --  209 223 226 189   < > = values in this interval not displayed.   Basic Metabolic Panel: Recent Labs  Lab 09/20/20 2250 09/20/20 2302 09/21/20 0403 09/22/20 0248 09/23/20 1150 09/24/20 0141  NA 128* 127*  128* 130* 128* 133* 136  K 4.7 4.7  4.7 3.3* 2.8*  2.8*  3.5 3.1*  CL 93* 92* 100 90* 98 101  CO2 21*  --  23 26 25 29   GLUCOSE 155* 157* 106* 105* 179* 136*  BUN 9 8 6* <5* <5* <5*  CREATININE 0.58 0.40* 0.38* 0.49 0.51 0.42*  CALCIUM 9.3  --  7.7* 8.2* 8.5* 8.4*  MG 1.4*  --  1.6* 1.8 1.3* 2.7*   GFR: Estimated Creatinine Clearance: 49.2 mL/min (A) (by C-G formula based on SCr of 0.42 mg/dL (L)). Liver Function Tests: Recent Labs  Lab 09/21/20 0403  AST 17  ALT 13  ALKPHOS 43  BILITOT 0.8  PROT 4.8*  ALBUMIN 2.9*   No results for input(s): LIPASE, AMYLASE in the last 168 hours. No results for input(s): AMMONIA in the last 168 hours. Coagulation Profile: No results for input(s): INR, PROTIME in the last 168 hours. Cardiac Enzymes: No results for input(s): CKTOTAL, CKMB, CKMBINDEX, TROPONINI in the last 168 hours. BNP (last 3 results) No results for input(s): PROBNP in the last 8760 hours. HbA1C: No results for input(s): HGBA1C in the last 72 hours. CBG: Recent Labs  Lab 09/20/20 2256  GLUCAP 156*   Lipid Profile: No results for input(s): CHOL,  HDL, LDLCALC, TRIG, CHOLHDL, LDLDIRECT in the last 72 hours. Thyroid Function Tests: No results for input(s): TSH, T4TOTAL, FREET4, T3FREE, THYROIDAB in the last 72 hours. Anemia Panel: No results for input(s): VITAMINB12, FOLATE, FERRITIN, TIBC, IRON, RETICCTPCT in the last 72 hours. Sepsis Labs: Recent Labs  Lab 09/21/20 0403  PROCALCITON <0.10    Recent Results (from the past 240 hour(s))  Urine Culture     Status: Abnormal   Collection Time: 09/19/20  7:02 PM   Specimen: Urine, Catheterized  Result Value Ref Range Status   Specimen Description   Final    URINE, CATHETERIZED Performed at [facility], [address] [place], [place]    Special Requests   Final    NONE Performed at [facility], [address] [place], [place]    Culture 80,000 COLONIES/mL PROTEUS MIRABILIS (A)  Final   Report Status 09/22/2020 FINAL  Final   Organism ID, Bacteria PROTEUS MIRABILIS (A)  Final      Susceptibility   Proteus mirabilis - MIC*    AMPICILLIN <=2 SENSITIVE Sensitive     CEFAZOLIN <=4 SENSITIVE Sensitive     CEFEPIME <=0.12 SENSITIVE Sensitive     CEFTRIAXONE <=0.25 SENSITIVE Sensitive     CIPROFLOXACIN <=0.25 SENSITIVE Sensitive     GENTAMICIN <=1 SENSITIVE Sensitive     IMIPENEM 2 SENSITIVE Sensitive     NITROFURANTOIN 128 RESISTANT Resistant     TRIMETH/SULFA <=20 SENSITIVE Sensitive     AMPICILLIN/SULBACTAM <=2 SENSITIVE Sensitive     PIP/TAZO <=4 SENSITIVE Sensitive     * 80,000 COLONIES/mL PROTEUS MIRABILIS  Resp Panel by RT-PCR (Flu A&B, Covid) Nasopharyngeal Swab     Status: None   Collection Time: 09/21/20  4:37 AM   Specimen: Nasopharyngeal Swab; Nasopharyngeal(NP) swabs in vial transport medium  Result Value Ref Range Status   SARS Coronavirus 2 by RT PCR NEGATIVE NEGATIVE Final    Comment: (NOTE) SARS-CoV-2 target nucleic acids are NOT DETECTED.  The SARS-CoV-2 RNA is generally detectable in upper  respiratory specimens during the acute phase of infection. The lowest concentration of SARS-CoV-2 viral copies this assay can detect is 138 copies/mL. A negative result does not preclude SARS-Cov-2 infection and should not be used as the sole basis for  treatment or other patient management decisions. A negative result may occur with  improper specimen collection/handling, submission of specimen other than nasopharyngeal swab, presence of viral mutation(s) within the areas targeted by this assay, and inadequate number of viral copies(<138 copies/mL). A negative result must be combined with clinical observations, patient history, and epidemiological information. The expected result is Negative.  Fact Sheet for Patients:  BloggerCourse.com  Fact Sheet for Healthcare Providers:  SeriousBroker.it  This test is no t yet approved or cleared by the [place] FDA and  has been authorized for detection and/or diagnosis of SARS-CoV-2 by FDA under an Emergency Use Authorization (EUA). This EUA will remain  in effect (meaning this test can be used) for the duration of the COVID-19 declaration under Section 564(b)(1) of the Act, 21 U.S.C.section 360bbb-3(b)(1), unless the authorization is terminated  or revoked sooner.       Influenza A by PCR NEGATIVE NEGATIVE Final   Influenza B by PCR NEGATIVE NEGATIVE Final    Comment: (NOTE) The Xpert Xpress SARS-CoV-2/FLU/RSV plus assay is intended as an aid in the diagnosis of influenza from Nasopharyngeal swab specimens and should not be used as a sole basis for treatment. Nasal washings and aspirates are unacceptable for Xpert Xpress SARS-CoV-2/FLU/RSV testing.  Fact Sheet for Patients: BloggerCourse.com  Fact Sheet for Healthcare Providers: SeriousBroker.it  This test is not yet approved or cleared by the [place] FDA and has been  authorized for detection and/or diagnosis of SARS-CoV-2 by FDA under an Emergency Use Authorization (EUA). This EUA will remain in effect (meaning this test can be used) for the duration of the COVID-19 declaration under Section 564(b)(1) of the Act, 21 U.S.C. section 360bbb-3(b)(1), unless the authorization is terminated or revoked.  Performed at [facility], [address], [place], [place] [address]   Urine Culture     Status: None   Collection Time: 09/22/20  7:50 AM   Specimen: Urine, Suprapubic  Result Value Ref Range Status   Specimen Description URINE, SUPRAPUBIC  Final   Special Requests NONE  Final   Culture   Final    NO GROWTH Performed at [facility], [address], [place], [place] [address]    Report Status 09/23/2020 FINAL  Final         Radiology Studies: DG Swallowing Func-Speech Pathology  Result Date: 09/23/2020 Table formatting from the original result was not included. Objective Swallowing Evaluation: Type of Study: MBS-Modified Barium Swallow Study  Patient Details Name: LILAS DIEFENDORF MRN: [id number] Date of Birth: Mar 12, 1940 Today's Date: 09/23/2020 Time: SLP Start Time (ACUTE ONLY): 1329 -SLP Stop Time (ACUTE ONLY): 1350 SLP Time Calculation (min) (ACUTE ONLY): 21 min Past Medical History: Past Medical History: Diagnosis Date  HTN (hypertension)   Multiple sclerosis (HCC)  Past Surgical History: Past Surgical History: Procedure Laterality Date  ABDOMINAL HYSTERECTOMY    APPENDECTOMY    CHOLECYSTECTOMY    HYSTERCTOMY   HPI: Pt is a 80 yo female admitted with AMS with concern for seizure activity and toxic/metabolic encephalopathy. CTH negative for acute changes. PMH: MS, HTN, scoliosis, chronic pain, cerebellar tumor, neurogenic bladder with suprapubic catheter  Subjective: alert, cooperative, needs cues Assessment / Plan / Recommendation CHL IP CLINICAL IMPRESSIONS 09/23/2020 Clinical Impression Pt has a mild oral dysphagia with prolonged mastication  of solids, anterior spillage of liquids via cup, and decreased bolus cohesion. She tosses liquids back into her pharynx, allowing them to pool in her pyriform sinuses before swallowing. Despite  this she maintaines good airway protection with nectar thick liquids (PAS 2), but thin liquids are sometimes spilled directly into the airway before the swallow and are silently aspirated (PAS 8). A wet vocal quality was only associated with a single instance of deep penetration of thin liquids but there was no change in vocal quality any time that she aspirated, nor was there any spontaneous throat clearing or coughing. Airway protection was not improved when attempting to change delivery method or use chin tuck. Recommend Dys 2 (finely chopped) diet and nectar thick liquids. SLP Visit Diagnosis Dysphagia, oropharyngeal phase (R13.12) Attention and concentration deficit following -- Frontal lobe and executive function deficit following -- Impact on safety and function Mild aspiration risk;Moderate aspiration risk   CHL IP TREATMENT RECOMMENDATION 09/23/2020 Treatment Recommendations Therapy as outlined in treatment plan below   Prognosis 09/23/2020 Prognosis for Safe Diet Advancement Good Barriers to Reach Goals -- Barriers/Prognosis Comment -- CHL IP DIET RECOMMENDATION 09/23/2020 SLP Diet Recommendations Dysphagia 2 (Fine chop) solids;Nectar thick liquid Liquid Administration via Straw Medication Administration Crushed with puree Compensations Minimize environmental distractions;Slow rate;Small sips/bites;Follow solids with liquid Postural Changes Seated upright at 90 degrees;Remain semi-upright after after feeds/meals (Comment)   CHL IP OTHER RECOMMENDATIONS 09/23/2020 Recommended Consults -- Oral Care Recommendations Oral care BID Other Recommendations Prohibited food (jello, ice cream, thin soups);Remove water pitcher   CHL IP FOLLOW UP RECOMMENDATIONS 09/23/2020 Follow up Recommendations Home health SLP   CHL IP FREQUENCY  AND DURATION 09/23/2020 Speech Therapy Frequency (ACUTE ONLY) min 2x/week Treatment Duration 2 weeks      CHL IP ORAL PHASE 09/23/2020 Oral Phase Impaired Oral - Pudding Teaspoon -- Oral - Pudding Cup -- Oral - Honey Teaspoon -- Oral - Honey Cup -- Oral - Nectar Teaspoon -- Oral - Nectar Cup -- Oral - Nectar Straw Decreased bolus cohesion Oral - Thin Teaspoon -- Oral - Thin Cup Decreased bolus cohesion Oral - Thin Straw Decreased bolus cohesion Oral - Puree WFL Oral - Mech Soft -- Oral - Regular Impaired mastication Oral - Multi-Consistency -- Oral - Pill -- Oral Phase - Comment --  CHL IP PHARYNGEAL PHASE 09/23/2020 Pharyngeal Phase Impaired Pharyngeal- Pudding Teaspoon -- Pharyngeal -- Pharyngeal- Pudding Cup -- Pharyngeal -- Pharyngeal- Honey Teaspoon -- Pharyngeal -- Pharyngeal- Honey Cup -- Pharyngeal -- Pharyngeal- Nectar Teaspoon -- Pharyngeal -- Pharyngeal- Nectar Cup -- Pharyngeal -- Pharyngeal- Nectar Straw Delayed swallow initiation-pyriform sinuses;Penetration/Aspiration during swallow Pharyngeal Material enters airway, remains ABOVE vocal cords then ejected out Pharyngeal- Thin Teaspoon -- Pharyngeal -- Pharyngeal- Thin Cup Delayed swallow initiation-pyriform sinuses;Penetration/Aspiration before swallow Pharyngeal Material enters airway, passes BELOW cords without attempt by patient to eject out (silent aspiration) Pharyngeal- Thin Straw Delayed swallow initiation-pyriform sinuses;Penetration/Aspiration before swallow Pharyngeal Material enters airway, passes BELOW cords without attempt by patient to eject out (silent aspiration) Pharyngeal- Puree WFL Pharyngeal -- Pharyngeal- Mechanical Soft -- Pharyngeal -- Pharyngeal- Regular WFL Pharyngeal -- Pharyngeal- Multi-consistency -- Pharyngeal -- Pharyngeal- Pill -- Pharyngeal -- Pharyngeal Comment --  CHL IP CERVICAL ESOPHAGEAL PHASE 09/23/2020 Cervical Esophageal Phase WFL Pudding Teaspoon -- Pudding Cup -- Honey Teaspoon -- Honey Cup -- Nectar Teaspoon --  Nectar Cup -- Nectar Straw -- Thin Teaspoon -- Thin Cup -- Thin Straw -- Puree -- Mechanical Soft -- Regular -- Multi-consistency -- Pill -- Cervical Esophageal Comment -- Mahala Menghini., M.A. CCC-SLP Acute Rehabilitation Services Pager [phone] Office [phone] 09/23/2020, 2:39 PM  Scheduled Meds:  acetaminophen  1,000 mg Oral BID AC   amLODipine  2.5 mg Oral QHS   chlorhexidine  15 mL Mouth Rinse BID   Chlorhexidine Gluconate Cloth  6 each Topical Daily   cholecalciferol  2,000 Units Oral TID   cholestyramine  4 g Oral BID AC   divalproex  500 mg Oral Q12H   enoxaparin (LOVENOX) injection  40 mg Subcutaneous Q24H   mouth rinse  15 mL Mouth Rinse q12n4p   pantoprazole sodium  40 mg Oral Daily   potassium chloride  40 mEq Oral Once   propranolol  40 mg Oral BID   QUEtiapine  75 mg Oral QHS   sodium chloride flush  3 mL Intravenous Q12H   temazepam  30 mg Oral QHS   venlafaxine  100 mg Oral TID WC   Continuous Infusions:  azithromycin 500 mg (09/24/20 1704)   cefTRIAXone (ROCEPHIN)  IV 1 g (09/24/20 1337)     LOS: 4 days    Time spent: 35 mins.More than 50% of that time was spent in counseling and/or coordination of care.      Burnadette PopAmrit Donold Marotto, MD [facility] P8/18/2022, 7:57 AM

## 2020-09-25 NOTE — Consult Note (Signed)
  Kelly Cole is a 79 y.o. female with medical history significant of multiple sclerosis, neurogenic bladder status post suprapubic catheter, scoliosis, ankle foot contracture, hypertension, depression, tremor, chronic pain who presents after seizure-like activity at home. For suspicion of aspiration pneumonia, antibiotics were started. No further seizure episodes are noted. AED has been modified per neurology, recommending ongoing depakote and discontinuation of bupropion.   Psych consult history of anxiety/depression on multiple meds. Admitted for seizure, has behavioral disturbance not back to her baseline. We request your help for psych med adjustment.   Husband reports he was only concerned about her Buspar not being restarted. He is in hopes that she will be able to resume her medications during this admission to help her anxiety. He reports most of her behavioral problems are from her anxiety. Patient denies any new concerns at this time. She is being followed by outpatient psychiatry, and is compliant with medications.   -Will start Buspar 10mg  po TID for anxiety.  -Continue other home medications.   Psychiatry to sign off at this time. Please call with any concerns.

## 2020-09-26 ENCOUNTER — Other Ambulatory Visit (HOSPITAL_COMMUNITY): Payer: Self-pay

## 2020-09-26 DIAGNOSIS — F330 Major depressive disorder, recurrent, mild: Secondary | ICD-10-CM

## 2020-09-26 LAB — BASIC METABOLIC PANEL
Anion gap: 9 (ref 5–15)
BUN: <5 mg/dL — ABNORMAL LOW (ref 8–23)
CO2: 32 mmol/L (ref 22–32)
Calcium: 9 mg/dL (ref 8.9–10.3)
Chloride: 95 mmol/L — ABNORMAL LOW (ref 98–111)
Creatinine, Ser: 0.38 mg/dL — ABNORMAL LOW (ref 0.44–1.00)
GFR, Estimated: >60 mL/min (ref >60)
Glucose, Bld: 80 mg/dL (ref 70–99)
Potassium: 3.5 mmol/L (ref 3.5–5.1)
Sodium: 136 mmol/L (ref 135–145)

## 2020-09-26 MED ORDER — PANTOPRAZOLE SODIUM 40 MG PO PACK
40.0000 mg | PACK | Freq: Every day | ORAL | 0 refills | Status: DC
  Filled 2020-09-26: qty 30, 30d supply, fill #0

## 2020-09-26 MED ORDER — CEFDINIR 300 MG PO CAPS
300.0000 mg | ORAL_CAPSULE | Freq: Two times a day (BID) | ORAL | 0 refills | Status: AC
  Filled 2020-09-26: qty 14, 7d supply, fill #0

## 2020-09-26 MED ORDER — LOPERAMIDE HCL 2 MG PO CAPS
2.0000 mg | ORAL_CAPSULE | Freq: Three times a day (TID) | ORAL | 0 refills | Status: AC | PRN
  Filled 2020-09-26: qty 30, 10d supply, fill #0

## 2020-09-26 MED ORDER — ACETAMINOPHEN 325 MG PO TABS
650.0000 mg | ORAL_TABLET | Freq: Four times a day (QID) | ORAL | 0 refills | Status: AC | PRN
  Filled 2020-09-26: qty 240, 30d supply, fill #0

## 2020-09-26 MED ORDER — AMLODIPINE BESYLATE 5 MG PO TABS
5.0000 mg | ORAL_TABLET | Freq: Every day | ORAL | 0 refills | Status: DC
  Filled 2020-09-26: qty 30, 30d supply, fill #0

## 2020-09-26 MED ORDER — QUETIAPINE FUMARATE 25 MG PO TABS
75.0000 mg | ORAL_TABLET | Freq: Every day | ORAL | 0 refills | Status: DC
  Filled 2020-09-26: qty 90, 30d supply, fill #0

## 2020-09-26 MED ORDER — PROPRANOLOL HCL 40 MG PO TABS
40.0000 mg | ORAL_TABLET | Freq: Two times a day (BID) | ORAL | 0 refills | Status: DC
  Filled 2020-09-26: qty 60, 30d supply, fill #0

## 2020-09-26 MED ORDER — VENLAFAXINE HCL 100 MG PO TABS
100.0000 mg | ORAL_TABLET | Freq: Three times a day (TID) | ORAL | 0 refills | Status: AC
  Filled 2020-09-26: qty 90, 30d supply, fill #0

## 2020-09-26 MED ORDER — DIVALPROEX SODIUM 125 MG PO CSDR
500.0000 mg | DELAYED_RELEASE_CAPSULE | Freq: Two times a day (BID) | ORAL | 0 refills | Status: DC
  Filled 2020-09-26: qty 240, 30d supply, fill #0

## 2020-09-26 MED ORDER — POLYETHYLENE GLYCOL 3350 17 GM/SCOOP PO POWD
17.0000 g | Freq: Every day | ORAL | 0 refills | Status: AC | PRN
  Filled 2020-09-26: qty 510, 30d supply, fill #0

## 2020-09-26 NOTE — TOC Transition Note (Signed)
Transition of Care [facility]) - CM/SW Discharge Note   Patient Details  Name: Kelly Cole MRN: [id number] Date of Birth: 01/02/1941  [facility] CM/SW Contact:  Geralynn Ochs, LCSW Phone Number: 09/26/2020, 3:54 PM   Clinical Narrative:   CSW met with patient's spouse, Ronalee Belts, at bedside to discuss patient's discharge home. Ronalee Belts asking about [facility] follow up and what that would look like, and CSW explained that representative from [facility] will contact him to answer those questions. Ronalee Belts also asked about home health coming out daily to assist with flushing the patient's catheter, and CSW explained that home health won't do that and he will have to learn how to do that. Ronalee Belts indicated that the urologist has tried to teach him but he struggles to manage it because of his own health issues. CSW unable to obtain a home health agency that can meet the patient's needs. CSW initially setup patient with [facility] home, and then Ronalee Belts decided to take her home via private vehicle. No other TOC needs at this time.    Final next level of care: Home/Self Care Barriers to Discharge: Barriers Resolved   Patient Goals and CMS Choice Patient states their goals for this hospitalization and ongoing recovery are:: to go home CMS Medicare.gov Compare Post Acute Care list provided to:: Other (Comment Required) Choice offered to / list presented to : Spouse  Discharge Placement                Patient to be transferred to facility by: Family car Name of family member notified: Ronalee Belts Patient and family notified of of transfer: 09/26/20  Discharge Plan and Services   Discharge Planning Services: CM Consult                                 Social Determinants of Health (SDOH) Interventions     Readmission Risk Interventions No flowsheet data found.

## 2020-09-26 NOTE — Progress Notes (Signed)
     Chart reviewed. Updates received.   Patient continues to show signs of clinical improvement and per husband baseline return. He is remaining hopeful with expressed wishes for patient to return home with his support in addition to home health and hired caregivers.   Goals remain clear. Education provided on outpatient palliative support. Referral orders placed for TOC to arrange outpatient palliative support at discharge. Husband verbalized understanding and appreciation.   Assessment -NAD, awake, alert -RRR -clear bilaterally -AAO x3, mood appropriate  Plan -continue current plan of care -Expressed goals are clear by husband to return home with caregiver/homehealth support as needed -Outpatient Palliative support to be arranged (TOC referral placed on 09/23/2020).   Time Total: 20 min   Visit consisted of counseling and education dealing with the complex and emotionally intense issues of symptom management and palliative care in the setting of serious and potentially life-threatening illness.Greater than 50%  of this time was spent counseling and coordinating care related to the above assessment and plan.  Willette Alma, AGPCNP-BC  Palliative Medicine Team [phone]

## 2020-09-26 NOTE — Evaluation (Signed)
Physical Therapy Evaluation Patient Details Name: Kelly Cole MRN: [id number] DOB: 05/01/1940 Today's Date: 09/26/2020   History of Present Illness  80 y.o. female with medical history significant of multiple sclerosis, neurogenic bladder status post suprapubic catheter, scoliosis, ankle foot contracture, hypertension, depression, tremor, chronic pain who presents after seizure-like activity at home.   Clinical Impression  Pt in bed upon arrival of PT, agreeable to evaluation at this time. Prior to admission the pt was dependent on PCA or husband to complete all bed mobility and pivot transfers from bed-WC. The pt presents at this time very close to her functional baseline, and was able to verbally direct therapists in technique for all mobility. The pt was also able to complete bed mobility with use of bed rails with min-modA (an improvement from baseline) but the pt's spouse states the current bed is incompatible with this piece of equipment despite reduction in assist level needed. The pt was able to complete pivot to recliner with assist from therapist at this time, and both the pt and her spouse state she is at her functional baseline. No further acute PT needs identified as the pt and her spouse are well equipped. All education completed, no further PT needs acutely or following d/c. Thank you for the consult.      Follow Up Recommendations No PT follow up (resume PCA)    Equipment Recommendations  None recommended by PT (pt well equipped)    Recommendations for Other Services       Precautions / Restrictions Precautions Precautions: Fall Precaution Comments: B ankle contractures, knee contractures, and begining R hand contracture.  Incontinent of stool. Restrictions Weight Bearing Restrictions: No Other Position/Activity Restrictions: per spouse, pt does not bear weight on BLE due to contractures      Mobility  Bed Mobility Overal bed mobility: Needs Assistance Bed Mobility:  Rolling;Sidelying to Sit Rolling: Mod assist (with bed rail) Sidelying to sit: Mod assist;HOB elevated       General bed mobility comments: pt needing min-modA to initiate roll, great use of BUE to pull on bed rails to complete roll and able to sustain sidelying position without assist. modA from elevated HOB to complete transition to sitting    Transfers Overall transfer level: Needs assistance Equipment used: 1 person hand held assist Transfers: Squat Pivot Transfers     Squat pivot transfers: Total assist     General transfer comment: pt reaching up to hold therapist with BUE, verbalizes when she is ready, and is able to direct therapist in transfer technique from home. pt utilizing squat pivot with therapist completing all lifting and turning.  Ambulation/Gait             General Gait Details: pt unable     Balance Overall balance assessment: Needs assistance Sitting-balance support: Bilateral upper extremity supported;Feet unsupported Sitting balance-Leahy Scale: Poor Sitting balance - Comments: pt able to support with one UE on bed rail and other on bed. minA to maintain but demos good effort to maintain Postural control: Posterior lean;Right lateral lean   Standing balance-Leahy Scale: Zero Standing balance comment: dependent on therapist                             Pertinent Vitals/Pain Pain Assessment: No/denies pain Pain Intervention(s): Monitored during session    Home Living Family/patient expects to be discharged to:: Private residence Living Arrangements: Spouse/significant other;Non-relatives/Friends Available Help at Discharge: Family;Personal care attendant;Available 24 hours/day  Type of Home: House Home Access: Level entry     Home Layout: One level Home Equipment: Bedside commode;Hand held shower head;Grab bars - tub/shower;Wheelchair - Geophysical data processor      Prior Function Level of Independence: Needs assistance   Gait /  Transfers Assistance Needed: total assist for transfers, bed mobility and unable to push w/c  ADL's / Homemaking Assistance Needed: total assist for ADL except feeding, unable to participate with home management, meals, medication mangement.  Total assist with community mobility.  Comments: progressive MS     Hand Dominance   Dominant Hand: Right    Extremity/Trunk Assessment   Upper Extremity Assessment Upper Extremity Assessment: Defer to OT evaluation    Lower Extremity Assessment Lower Extremity Assessment: RLE deficits/detail;LLE deficits/detail RLE Deficits / Details: pt with limited ROM at ankle and knee (baseline), unable to attain neutral in DF or knee extension. pt with PRAFOs from home. multiple wounds on bilateral shins and LE LLE Deficits / Details: pt with limited ROM at ankle and knee (baseline), unable to attain neutral in DF or knee extension. pt with PRAFOs from home. multiple wounds on bilateral shins and LE    Cervical / Trunk Assessment Cervical / Trunk Assessment: Other exceptions Cervical / Trunk Exceptions: scoliosis  Communication   Communication: Expressive difficulties  Cognition Arousal/Alertness: Awake/alert Behavior During Therapy: WFL for tasks assessed/performed Overall Cognitive Status: History of cognitive impairments - at baseline                                 General Comments: ST memory deficts noted.  Follow commands well, and makes needs known.      General Comments General comments (skin integrity, edema, etc.): pt with suprapubic cath at baseline     PT Assessment Patent does not need any further PT services         PT Goals (Current goals can be found in the Care Plan section)  Acute Rehab PT Goals Patient Stated Goal: I'd like to get up PT Goal Formulation: With patient Time For Goal Achievement: 10/03/20 Potential to Achieve Goals: Good            Co-evaluation PT/OT/SLP Co-Evaluation/Treatment:  Yes Reason for Co-Treatment: Complexity of the patient's impairments (multi-system involvement);For patient/therapist safety;To address functional/ADL transfers PT goals addressed during session: Mobility/safety with mobility;Balance;Strengthening/ROM         AM-PAC PT "6 Clicks" Mobility  Outcome Measure Help needed turning from your back to your side while in a flat bed without using bedrails?: A Lot Help needed moving from lying on your back to sitting on the side of a flat bed without using bedrails?: A Lot Help needed moving to and from a bed to a chair (including a wheelchair)?: Total Help needed standing up from a chair using your arms (e.g., wheelchair or bedside chair)?: Total Help needed to walk in hospital room?: Total Help needed climbing 3-5 steps with a railing? : Total 6 Click Score: 8    End of Session         PT Visit Diagnosis: Other abnormalities of gait and mobility (R26.89);Muscle weakness (generalized) (M62.81)    Time: 1191-4782 PT Time Calculation (min) (ACUTE ONLY): 23 min   Charges:   PT Evaluation $PT Eval Low Complexity: 1 Low          Derian Dimalanta Berlin Hun, PT, DPT   Acute Rehabilitation Department Pager #: [phone]  Ronnie Derby 09/26/2020, 12:39 PM

## 2020-09-26 NOTE — Discharge Summary (Signed)
Physician Discharge Summary  Patient ID: Kelly Cole MRN: [id number] DOB/AGE: Dec 12, 1940 80 y.o.  Admit date: 09/20/2020 Discharge date: 09/26/2020  Admission Diagnoses:  Discharge Diagnoses:  Principal Problem:   Seizure [facility]) Active Problems:   Multiple sclerosis (HCC)   Scoliosis   Spasticity   Contracture of ankle and foot joint, unspecified laterality   Essential hypertension   Essential tremor   Mild recurrent major depression (HCC)   Suprapubic catheter (HCC)   Seizure-like activity (HCC)   Pressure injury of skin   Discharged Condition: stable  Hospital Course:  Patient is a 80 year old female with past medical history significant for multiple sclerosis, neurogenic bladder s/p suprapubic catheter placement, scoliosis, hypertension, depression, tremor and chronic pain syndrome.  Patient presented from home with seizure-like activity.  On presentation, patient was suspected to have aspiration pneumonia and antibiotics were started.  Neurology was consulted and patient's antiseizure medications were adjusted.  No further episodes of seizures thereafter. Hospital course was remarkable for persistent agitation and confusion.  Both symptoms have resolved significantly.  Patient's husband informs [place] that patient is back to her baseline.  Patient's husband is willing to take patient home today.  During the hospital stay, patient was evaluated by the psychiatric team and the palliative care team.  Speech therapy also assess patient due to aspiration risk.  Aspiration precautions as per speech therapy recommendations.  Occupational Therapy assessment is noted.  Occupational Therapy recommended home health.  Physical therapy will assess patient prior to discharge.  Transition of care team will assist with patient's discharge needs.  Patient will follow-up with primary care provider, psychiatric team and neurology on discharge.    Seizure-like activity:  -Neurology consulted -Patient was  loaded with Keppra on presentation. -EEG showed only diffuse encephalopathy, but no seizures. -Patient was started on Depakote. -Keppra was discontinued, as well as, bupropion.     Acute metabolic encephalopathy: -Noticed to be in postictal state following her seizure-like activity.   -There were concerns for possible aspiration pneumonia. -Patient was managed with IV Rocephin and azithromycin during the hospital stay. -Patient will be discharged on Omnicef 300 Mg p.o. twice daily for 1 week.     Suspected UTI: -Urine culture did not grow any organisms. -Patient has suprapubic catheter.     Left lobe aspiration pneumonia: -See above documentation. -Patient was seen by the speech therapy during the hospital stay. -Patient was managed with IV Rocephin and azithromycin during the hospital stay and, will be transitioned to oral Omnicef on discharge.  S -Leukocytosis has resolved.   Hypokalemia/hypomagnesemia: -Monitored and replaced during the hospital stay.     History of progressive multiple sclerosis with spasticity:  -Patient will follow with neurology on discharge.   -Patient is wheelchair dependent (cannot bear weight on her lower extremities).     Neurogenic bladder:  -Currently on suprapubic catheter.     Depression/anxiety:  -Bupropion has been discontinued.   -Patient was seen by the psychiatry team during the hospital stay.  Hypertension: -Amlodipine has been increased to 5 Mg p.o. once daily. -Continue to monitor blood pressure closely on discharge.     Tremor: On propanolol.   Goals of care: Elderly patient with multiple comorbidities.  Palliative care was consulted during this hospitalization.  CODE STATUS is DNR.  Goal is to return home with home health and hired caregivers.  Palliative care recommended outpatient follow-up at discharge.  Consults: neurology, psychiatry and palliative care medicine.  Significant Diagnostic Studies:   Discharge Exam: Blood  pressure Marland Kitchen)  164/66, pulse 68, temperature (!) 97.4 F (36.3 C), temperature source Oral, resp. rate 20, height 5\' 4"  (1.626 m), weight 57 kg, SpO2 99 %.   Disposition: Discharge disposition: 01-Home or Self Care       Discharge Instructions     Ambulatory referral to Neurology   Complete by: As directed    An appointment is requested in approximately: 4 weeks   Diet - low sodium heart healthy   Complete by: As directed    Discharge wound care:   Complete by: As directed    Continue current wound care management   Increase activity slowly   Complete by: As directed       Allergies as of 09/26/2020       Reactions   Demerol  [meperidine] Other (See Comments)        Medication List     STOP taking these medications    buPROPion 150 MG 24 hr tablet Commonly known as: WELLBUTRIN XL   mirabegron ER 50 MG Tb24 tablet Commonly known as: MYRBETRIQ   omeprazole 20 MG capsule Commonly known as: PRILOSEC   propranolol ER 80 MG 24 hr capsule Commonly known as: INDERAL LA Replaced by: propranolol 40 MG tablet   venlafaxine XR 150 MG 24 hr capsule Commonly known as: EFFEXOR-XR Replaced by: venlafaxine 100 MG tablet       TAKE these medications    acetaminophen 325 MG tablet Commonly known as: TYLENOL Take 2 tablets (650 mg total) by mouth every 6 (six) hours as needed for mild pain (or Fever >/= 101). What changed:  medication strength how much to take when to take this reasons to take this   amLODipine 5 MG tablet Commonly known as: NORVASC Take 1 tablet (5 mg total) by mouth at bedtime. What changed:  medication strength how much to take   busPIRone 10 MG tablet Commonly known as: BUSPAR Take 10 mg by mouth 3 (three) times daily.   cefdinir 300 MG capsule Commonly known as: OMNICEF Take 1 capsule (300 mg total) by mouth 2 (two) times daily for 7 days.   cholecalciferol 25 MCG (1000 UNIT) tablet Commonly known as: VITAMIN D3 Take 2,000 Units by  mouth 3 (three) times daily.   cholestyramine 4 g packet Commonly known as: QUESTRAN Take 4 g by mouth 2 (two) times daily before lunch and supper.   divalproex 125 MG capsule Commonly known as: DEPAKOTE SPRINKLE Take 4 capsules (500 mg total) by mouth every 12 (twelve) hours.   loperamide 2 MG capsule Commonly known as: IMODIUM Take 1 capsule (2 mg total) by mouth 3 (three) times daily as needed for diarrhea or loose stools. What changed:  when to take this reasons to take this   pantoprazole sodium 40 mg Pack Commonly known as: PROTONIX Take 20 mLs (40 mg total) by mouth daily. Start taking on: September 27, 2020   polyethylene glycol 17 g packet Commonly known as: MIRALAX / GLYCOLAX Take 17 g by mouth daily as needed for mild constipation.   propranolol 40 MG tablet Commonly known as: INDERAL Take 1 tablet (40 mg total) by mouth 2 (two) times daily. Replaces: propranolol ER 80 MG 24 hr capsule   QUEtiapine 25 MG tablet Commonly known as: SEROQUEL Take 3 tablets (75 mg total) by mouth at bedtime.   temazepam 15 MG capsule Commonly known as: RESTORIL Take 30 mg by mouth at bedtime.   venlafaxine 100 MG tablet Commonly known as: EFFEXOR Take 1 tablet (  100 mg total) by mouth 3 (three) times daily with meals. Replaces: venlafaxine XR 150 MG 24 hr capsule               Discharge Care Instructions  (From admission, onward)           Start     Ordered   09/26/20 0000  Discharge wound care:       Comments: Continue current wound care management   09/26/20 1223             Signed: Barnetta Chapel 09/26/2020, 12:25 PM

## 2020-09-26 NOTE — Progress Notes (Signed)
  Speech Language Pathology Treatment: Dysphagia  Patient Details Name: Kelly Cole MRN: [id number] DOB: [birth year] Today's Date: 09/26/2020 Time: 7412-8786 SLP Time Calculation (min) (ACUTE ONLY): 18 min  Assessment / Plan / Recommendation Clinical Impression  Session encompassed education re: swallow status and recommendations for discharge today. Reviewed results of MBS, supplied pt with home Simply thick kit (cup and thickener) and husband reported purchasing some online. Discussed clinical reasoning for repeat MBS to upgrade from thin liquids given she does not sense aspiration. Continue Dys 2, nectar and swallow precautions; rec HH SLP. Husband reported Palliative is following at home. All questions answered.    HPI HPI: Pt is a 80 yo female admitted with AMS with concern for seizure activity and toxic/metabolic encephalopathy. CTH negative for acute changes. PMH: MS, HTN, scoliosis, chronic pain, cerebellar tumor, neurogenic bladder with suprapubic catheter      SLP Plan  All goals met       Recommendations  Diet recommendations: Dysphagia 2 (fine chop);Nectar-thick liquid Liquids provided via: Straw Medication Administration: Crushed with puree Supervision: Staff to assist with self feeding;Full supervision/cueing for compensatory strategies Compensations: Slow rate;Small sips/bites;Clear throat intermittently Postural Changes and/or Swallow Maneuvers: Seated upright 90 degrees;Upright 30-60 min after meal                Oral Care Recommendations: Oral care BID Follow up Recommendations: Home health SLP SLP Visit Diagnosis: Dysphagia, oropharyngeal phase (R13.12) Plan: All goals met                      Houston Siren 09/26/2020, 1:18 PM  Orbie Pyo Colvin Caroli.Ed Risk analyst [phone] Office [phone]

## 2020-09-26 NOTE — Progress Notes (Signed)
Attempted to go over discharge instructions with husband. I explained that I was in the middle of a rapid response for another patient and that I needed more time to go over instructions with him but he was insistent on leaving now. I briefly went over medications with him and answered his questions. He took his wife home in a personal wheelchair with assistance from a personal caregiver.

## 2020-09-26 NOTE — Progress Notes (Signed)
[facility] ([facility])  Hospital Liaison: RN note         This patient has been referred to our palliative care services in the community.  [facility] will continue to follow for any discharge planning needs and to coordinate continuation of palliative care in the outpatient setting.    If you have questions or need assistance, please call [phone] or contact the hospital Liaison listed on AMION.      Thank you for this referral.         Mary Anne Robertson, RN, CCM  [facility] Hospital Liaison   [phone] 

## 2020-09-26 NOTE — Progress Notes (Signed)
Occupational Therapy Treatment Patient Details Name: Kelly Cole MRN: [id number] DOB: 1940-03-24 Today's Date: 09/26/2020    History of present illness 80 y.o. female with medical history significant of multiple sclerosis, neurogenic bladder status post suprapubic catheter, scoliosis, ankle foot contracture, hypertension, depression, tremor, chronic pain who presents after seizure-like activity at home.   OT comments  Pt progressing towards established OT goals. Pt supine in bed having just finished bath with NT upon OT and PT arrival. Pt very agreeable to OOB activity. Pt requiring Total A for squat pivot to recliner. Discusses splint needs wit husband. Pt's husband reports he plans to follow up with next therapist and order splint. Continue to recommend dc to home with HHOT follow up. Will continue to follow acutely as admitted.     Follow Up Recommendations  Home health OT    Equipment Recommendations  None recommended by OT    Recommendations for Other Services      Precautions / Restrictions Precautions Precautions: Fall Precaution Comments: B ankle contractures, knee contractures, and begining R hand contracture.  Incontinent of stool. Restrictions Weight Bearing Restrictions: No Other Position/Activity Restrictions: per spouse, pt does not bear weight on BLE due to contractures       Mobility Bed Mobility Overal bed mobility: Needs Assistance Bed Mobility: Rolling;Sidelying to Sit Rolling: Mod assist Sidelying to sit: Mod assist;HOB elevated Supine to sit: Total assist Sit to supine: Total assist   General bed mobility comments: pt needing min-modA to initiate roll, great use of BUE to pull on bed rails to complete roll and able to sustain sidelying position without assist. modA from elevated HOB to complete transition to sitting    Transfers Overall transfer level: Needs assistance Equipment used: 1 person hand held assist Transfers: Squat Pivot Transfers      Squat pivot transfers: Total assist     General transfer comment: pt reaching up to hold therapist with BUE, verbalizes when she is ready, and is able to direct therapist in transfer technique from home. pt utilizing squat pivot with therapist completing all lifting and turning.    Balance Overall balance assessment: Needs assistance Sitting-balance support: Bilateral upper extremity supported;Feet unsupported Sitting balance-Leahy Scale: Poor Sitting balance - Comments: pt able to support with one UE on bed rail and other on bed. minA to maintain but demos good effort to maintain Postural control: Posterior lean;Right lateral lean   Standing balance-Leahy Scale: Zero Standing balance comment: dependent on therapist                           ADL either performed or assessed with clinical judgement   ADL Overall ADL's : At baseline                     Lower Body Dressing: Maximal assistance Lower Body Dressing Details (indicate cue type and reason): don shoes. able to maintain sitting balance Toilet Transfer: Total assistance           Functional mobility during ADLs: Total assistance       Vision       Perception     Praxis      Cognition Arousal/Alertness: Awake/alert Behavior During Therapy: WFL for tasks assessed/performed Overall Cognitive Status: History of cognitive impairments - at baseline  General Comments: ST memory deficts noted.  Follow commands well, and makes needs known.        Exercises     Shoulder Instructions       General Comments pt with suprapubic cath at baseline    Pertinent Vitals/ Pain       Pain Assessment: Faces Faces Pain Scale: No hurt Pain Intervention(s): Monitored during session  Home Living Family/patient expects to be discharged to:: Private residence Living Arrangements: Spouse/significant other;Non-relatives/Friends Available Help at Discharge:  Family;Personal care attendant;Available 24 hours/day Type of Home: House Home Access: Level entry     Home Layout: One level     Bathroom Shower/Tub: Producer, television/film/video: Handicapped height Bathroom Accessibility: Yes   Home Equipment: Bedside commode;Hand held shower head;Grab bars - tub/shower;Wheelchair - Geophysical data processor          Prior Functioning/Environment Level of Independence: Needs assistance  Gait / Transfers Assistance Needed: total assist for transfers, bed mobility and unable to push w/c ADL's / Homemaking Assistance Needed: total assist for ADL except feeding, unable to participate with home management, meals, medication mangement.  Total assist with community mobility.   Comments: progressive MS   Frequency           Progress Toward Goals  OT Goals(current goals can now be found in the care plan section)  Progress towards OT goals: Progressing toward goals  Acute Rehab OT Goals Patient Stated Goal: I'd like to get up OT Goal Formulation: With patient Time For Goal Achievement: 10/09/20 Potential to Achieve Goals: Good ADL Goals Additional ADL Goal #1: Patient will tolerate R resting hand splint 4hours on and 4 hours off.  Plan Discharge plan remains appropriate    Co-evaluation      Reason for Co-Treatment: Complexity of the patient's impairments (multi-system involvement);For patient/therapist safety;To address functional/ADL transfers PT goals addressed during session: Mobility/safety with mobility;Balance;Strengthening/ROM        AM-PAC OT "6 Clicks" Daily Activity     Outcome Measure   Help from another person eating meals?: A Little Help from another person taking care of personal grooming?: A Lot Help from another person toileting, which includes using toliet, bedpan, or urinal?: Total Help from another person bathing (including washing, rinsing, drying)?: Total Help from another person to put on and taking off  regular upper body clothing?: A Lot Help from another person to put on and taking off regular lower body clothing?: Total 6 Click Score: 10    End of Session    OT Visit Diagnosis: Muscle weakness (generalized) (M62.81)   Activity Tolerance Patient tolerated treatment well   Patient Left in bed;with call bell/phone within reach;with bed alarm set;with family/visitor present   Nurse Communication Mobility status        Time: 2595-6387 OT Time Calculation (min): 24 min  Charges: OT General Charges $OT Visit: 1 Visit OT Treatments $Self Care/Home Management : 8-22 mins  Ronna Herskowitz MSOT, OTR/L Acute Rehab Pager: [phone] Office: [phone]   Theodoro Grist Marietta Sikkema 09/26/2020, 3:15 PM

## 2020-09-26 NOTE — Plan of Care (Signed)
  Problem: Clinical Measurements: Goal: Will remain free from infection Outcome: Progressing Goal: Diagnostic test results will improve Outcome: Progressing Goal: Respiratory complications will improve Outcome: Progressing Goal: Cardiovascular complication will be avoided Outcome: Progressing   

## 2020-09-30 ENCOUNTER — Telehealth: Payer: Self-pay

## 2020-09-30 DIAGNOSIS — L89301 Pressure ulcer of unspecified buttock, stage 1: Secondary | ICD-10-CM | POA: Diagnosis not present

## 2020-09-30 DIAGNOSIS — R569 Unspecified convulsions: Secondary | ICD-10-CM | POA: Diagnosis not present

## 2020-09-30 DIAGNOSIS — J690 Pneumonitis due to inhalation of food and vomit: Secondary | ICD-10-CM | POA: Diagnosis not present

## 2020-09-30 DIAGNOSIS — G35 Multiple sclerosis: Secondary | ICD-10-CM | POA: Diagnosis not present

## 2020-09-30 NOTE — Telephone Encounter (Signed)
(  4:47 pm) SW received a return call from patient's husband-[name]. SW scheduled initial RN/SW palliative care visit.  Visit is scheduled for 10/01/20 @ 2 pm.

## 2020-09-30 NOTE — Telephone Encounter (Signed)
Palliative Medicine RN Note:  Rec'd a phone message from pt's husband Kelly Cole [phone]), who was asking about next steps for home palliative. Pt was d/c Friday 8/19.  I sent a message to [employer] ([facility] liaison Misty requesting they call him asap; she will call the palliative team with [facility] and ask them to reach out.  Kelly Chance Kenric Ginger, RN, BSN, [facility] Palliative Medicine Team 09/30/2020 10:47 AM Office [phone]

## 2020-09-30 NOTE — Telephone Encounter (Signed)
(  4:36 pm ) Palliative care SW completed follow-up call to discuss the palliative care referral received.  SW left a message requesting a call back.

## 2020-10-01 ENCOUNTER — Other Ambulatory Visit: Payer: PPO

## 2020-10-01 ENCOUNTER — Other Ambulatory Visit: Payer: Self-pay

## 2020-10-01 ENCOUNTER — Other Ambulatory Visit: Payer: PPO | Admitting: *Deleted

## 2020-10-01 DIAGNOSIS — Z515 Encounter for palliative care: Secondary | ICD-10-CM

## 2020-10-02 NOTE — Progress Notes (Signed)
COMMUNITY PALLIATIVE CARE SW NOTE  PATIENT NAME: Kelly Cole DOB: Nov 17, 1940 MRN: [id number]  PRIMARY CARE PROVIDER: Merlene Laughter, MD  RESPONSIBLE PARTY:  Acct ID - Guarantor Home Phone Work Phone Relationship Acct Type  [id number] FAYDRA, KORMAN* [phone]  Self P/F     [address], [place], [place] [address]     PLAN OF CARE and INTERVENTIONS:             GOALS OF CARE/ ADVANCE CARE PLANNING:  Goal is for patient to remain at home. Patient is a DNR. SOCIAL/EMOTIONAL/SPIRITUAL ASSESSMENT/ INTERVENTIONS:  SW and RN-M.Dimas Aguas completed an initial visit with patient at her home. Patient was present with her husband-[name] and sitter-Lorissa. The team provided introductions, education regarding the palliative care team, role in patient's care and visit frequencies. Patient's husband provided verbal consent to services. Patient was present in bed. Patient suffers from MS and scoliosis. She denied put reports generalized intermittent pain. She has several bruises and sores on her arms as result of her recent hospitalizations. Patient has several wounds on her leg, foot and heels. She is experiencing bouts with diarrhea. She is currently on an antibiotic. Patient has a suprapubic cathter. Patient has a referral for OT, PT, ST and a nurse to manage her catheter. There are concerns regarding patient's risk of aspiration pneumonia so she is on nectar thick fluids, which her husband and sitter are pushing. Patient is dependent for all ADL's. She is ambulated in a wheelchair and transferred utilizing a lift. Patient has sitters daily. Patient and her husband are open to ongoing support and visits by palliative care SW/RN team. PATIENT/CAREGIVER EDUCATION/ COPING:  Patient and her husband appear to be coping well. They have a supportive network of friends and neighbors.  PERSONAL EMERGENCY PLAN:  911 can be accessed for emergencies.  COMMUNITY RESOURCES COORDINATION/ HEALTH CARE NAVIGATION:  Patient has  private sitters. FINANCIAL/LEGAL CONCERNS/INTERVENTIONS:  None.     SOCIAL HX:  Social History   Tobacco Use   Smoking status: Never   Smokeless tobacco: Never  Substance Use Topics   Alcohol use: Yes    CODE STATUS: DNR ADVANCED DIRECTIVES: Yes MOST FORM COMPLETE:  Yes HOSPICE EDUCATION PROVIDED: Yes, differences in hospice and palliative care.  PPS: Patient is alert and oriented x3. She is dependent for all ADL's. She is bedbound.  Duration of  visit and documentation: 60 minutes   Clydia Llano, LCSW

## 2020-10-06 ENCOUNTER — Telehealth: Payer: Self-pay | Admitting: Neurology

## 2020-10-06 NOTE — Telephone Encounter (Signed)
Pt husband called in concerned about his wife. She was hospitalized and was put on Depakote. She has severe diarrhea and he is afraid he will lose their care givers. She has been on the meds about a week-week and half, he needs to know if she can take something else.

## 2020-10-07 NOTE — Telephone Encounter (Signed)
Pt Is sch for 10-09-20 with Everlena Cooper for a Virtual visit

## 2020-10-08 DIAGNOSIS — R338 Other retention of urine: Secondary | ICD-10-CM | POA: Diagnosis not present

## 2020-10-08 DIAGNOSIS — R197 Diarrhea, unspecified: Secondary | ICD-10-CM | POA: Diagnosis not present

## 2020-10-08 NOTE — Progress Notes (Signed)
Virtual Visit via Video Note The purpose of this virtual visit is to provide medical care while limiting exposure to the novel coronavirus.    Consent was obtained for video visit:  Yes.   Answered questions that patient had about telehealth interaction:  Yes.   I discussed the limitations, risks, security and privacy concerns of performing an evaluation and management service by telemedicine. I also discussed with the patient that there may be a patient responsible charge related to this service. The patient expressed understanding and agreed to proceed.  Pt location: Home Physician Location: office Name of referring provider:  Merlene Laughter, MD I connected with Kelly Cole at patients initiation/request on 10/09/2020 at  2:50 PM EDT by video enabled telemedicine application and verified that I am speaking with the correct person using two identifiers. 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  Therefore, we will transition to Lamictal 50mg  twice daily with Keppra as a bridge. Will order home health for PT/OT/Speech/Wound Care Follow up 3 months.   History of Present Illness:  Kelly Cole is a 80 year old white female with multiple sclerosis, depression and chronic low back pain who follows up for new-onset seizure.  History obtained by her husband and reviewing hospital records.  UPDATE: She was admitted to [facility] on 09/20/2020 for seizure-like activity.  She was found by her husband unresponsive with roving eye movements rolled back and restless non-purposeful movements of all extremities.  Lasted 45-60 seconds.  Afterwards, she was lethargic and then exhibited teeth  grinding with flailing of her arms.  EMS was called and she was brought to the ED.  Neurologic exam demonstrated bilateral lower extremity contractures without antigravity movement and brief coarsse tremor of the left upper extremity.  DTRs were 3+ throughout.  CT head personally reviewed showed left basal ganglia lacunar infarct but no acute intracranial abnormality.  EEG showed diffuse encephalopathy but no epileptiform discharges or electrographic seizures.  She was started on antibiotics for suspected aspiration pneumonia.  Urine culture was negative.  K and Mg were 3.3 and 1.4 respectively.  Na fell between 127-131.  She takes temazepam at home, which was switched to Ativan during hospitalization in order to prevent withdrawal.  He was discharged on Depakote sprinkles 500mg  every 12 hours.  Wellbutrin was discontinued.  Since she has been home, she has been on a thickened liquid and soft diet.  She sleeps a lot more.  She sometimes seems confused.  Sometimes she thinks her parents are still alive.  She has more difficulty making decisions such as with TV shows or beverages.  Also concerning is that she has been having diarrhea.    HISTORY: As a teenager, she was hospitalized for bilateral leg weakness.  They thought Polio but never had a diagnosis.  Symptoms resolved.  She was diagnosed with MS in 1970 while in 09/22/2020 after experiencing numbness and tingling in her fingers.  She may have received visual evoked potentials but was primarily diagnosed based on clinical findings.  Over the next decade, she gradually had decline in gait, developing a foot drop which caused her to trip.  In the mid-late 70s, she participated in a trial where  she was treated with chlorambucil and drop foot resolved.  High doses of vitamin injections (C, B1, B2, B12, etc)   Over the last 10-15 years, she has had a gradual decline in ambulation.  She progressed from cane to walker about 10 years ago.  She has been in a wheelchair  for the past 6 years, following a hemicolectomy.    MRI of brain from 2019 showed chronic demyelination.  Past disease modifying therapy:  Betaseron (for 1 year, stopped due to side effects and ineffective)  Past Medical History: Past Medical History:  Diagnosis Date   HTN (hypertension)    Multiple sclerosis (HCC)     Medications: Outpatient Encounter Medications as of 10/09/2020  Medication Sig   acetaminophen (TYLENOL) 325 MG tablet Take 2 tablets (650 mg total) by mouth every 6 (six) hours as needed for mild pain (or Fever >/= 101).   amLODipine (NORVASC) 5 MG tablet Take 1 tablet (5 mg total) by mouth at bedtime.   busPIRone (BUSPAR) 10 MG tablet Take 10 mg by mouth 3 (three) times daily.   cholecalciferol (VITAMIN D3) 25 MCG (1000 UNIT) tablet Take 2,000 Units by mouth 3 (three) times daily.   cholestyramine (QUESTRAN) 4 g packet Take 4 g by mouth 2 (two) times daily before lunch and supper.   divalproex (DEPAKOTE SPRINKLE) 125 MG capsule Take 4 capsules (500 mg total) by mouth every 12 (twelve) hours.   loperamide (IMODIUM) 2 MG capsule Take 1 capsule (2 mg total) by mouth 3 (three) times daily as needed for diarrhea or loose stools.   pantoprazole sodium (PROTONIX) 40 mg PACK Take 20 mLs (40 mg total) by mouth daily.   polyethylene glycol powder (GLYCOLAX/MIRALAX) 17 GM/SCOOP powder Dissolve 1 capful (17 g) in water and drink daily as needed for mild constipation.   propranolol (INDERAL) 40 MG tablet Take 1 tablet (40 mg total) by mouth 2 (two) times daily.   QUEtiapine (SEROQUEL) 25 MG tablet Take 3 tablets (75 mg total) by mouth at bedtime.   temazepam (RESTORIL) 15 MG capsule Take 30 mg by mouth at bedtime.   venlafaxine (EFFEXOR) 100 MG tablet Take 1 tablet (100 mg total) by mouth 3 (three) times daily with meals.   No facility-administered encounter medications on file as of 10/09/2020.    Allergies: Allergies  Allergen Reactions   Demerol  [Meperidine] Other (See  Comments)    Family History: No family history on file.  Observations/Objective:   There were no vitals taken for this visit. No acute distress.  Alert and oriented.  Speech fluent and not dysarthric.  Language intact.     Follow Up Instructions:    -I discussed the assessment and treatment plan with the patient. The patient was provided an opportunity to ask questions and all were answered. The patient agreed with the plan and demonstrated an understanding of the instructions.   The patient was advised to call back or seek an in-person evaluation if the symptoms worsen or if the condition fails to improve as anticipated.   Cira Servant, DO

## 2020-10-09 ENCOUNTER — Other Ambulatory Visit: Payer: Self-pay | Admitting: Neurology

## 2020-10-09 ENCOUNTER — Encounter: Payer: Self-pay | Admitting: Neurology

## 2020-10-09 ENCOUNTER — Other Ambulatory Visit: Payer: Self-pay

## 2020-10-09 ENCOUNTER — Telehealth (INDEPENDENT_AMBULATORY_CARE_PROVIDER_SITE_OTHER): Payer: PPO | Admitting: Neurology

## 2020-10-09 DIAGNOSIS — G35 Multiple sclerosis: Secondary | ICD-10-CM

## 2020-10-09 DIAGNOSIS — R569 Unspecified convulsions: Secondary | ICD-10-CM

## 2020-10-09 DIAGNOSIS — G252 Other specified forms of tremor: Secondary | ICD-10-CM

## 2020-10-09 MED ORDER — LAMOTRIGINE 25 MG PO TABS: ORAL_TABLET | ORAL | 0 refills | Status: DC

## 2020-10-09 MED ORDER — LEVETIRACETAM 500 MG PO TABS
500.0000 mg | ORAL_TABLET | Freq: Two times a day (BID) | ORAL | 0 refills | Status: DC
Start: 2020-10-09 — End: 2022-10-05

## 2020-10-09 NOTE — Patient Instructions (Addendum)
Plan is to transition from Depakote to Lamotrigine.  Because it takes 4 weeks to titrate up to a therapeutic dose of lamotrigine, I will also start Keppra/levetiracetam for 6 weeks as a bridge.  The below instructions are all started at the same time: Start levetiracetam 500mg  twice daily Start lamotrigine 25mg  twice daily for 2 weeks, then 50mg  twice daily Decrease Depakote to 2 capsules (250mg ) twice daily for 3 days, then 2 capsules (250mg ) at bedtime for 3 days, then STOP  Once Kelly Cole has been on 50mg  twice daily for 2 weeks, contact me and we can stop Keppra/levetiracetam.  Please contact me with any questions.

## 2020-10-12 DIAGNOSIS — Z993 Dependence on wheelchair: Secondary | ICD-10-CM | POA: Diagnosis not present

## 2020-10-12 DIAGNOSIS — Z9350 Unspecified cystostomy status: Secondary | ICD-10-CM | POA: Diagnosis not present

## 2020-10-12 DIAGNOSIS — R251 Tremor, unspecified: Secondary | ICD-10-CM | POA: Diagnosis not present

## 2020-10-12 DIAGNOSIS — R569 Unspecified convulsions: Secondary | ICD-10-CM | POA: Diagnosis not present

## 2020-10-12 DIAGNOSIS — F330 Major depressive disorder, recurrent, mild: Secondary | ICD-10-CM | POA: Diagnosis not present

## 2020-10-12 DIAGNOSIS — I10 Essential (primary) hypertension: Secondary | ICD-10-CM | POA: Diagnosis not present

## 2020-10-12 DIAGNOSIS — J690 Pneumonitis due to inhalation of food and vomit: Secondary | ICD-10-CM | POA: Diagnosis not present

## 2020-10-12 DIAGNOSIS — M479 Spondylosis, unspecified: Secondary | ICD-10-CM | POA: Diagnosis not present

## 2020-10-12 DIAGNOSIS — G35 Multiple sclerosis: Secondary | ICD-10-CM | POA: Diagnosis not present

## 2020-10-12 DIAGNOSIS — R1310 Dysphagia, unspecified: Secondary | ICD-10-CM | POA: Diagnosis not present

## 2020-10-14 ENCOUNTER — Telehealth: Payer: Self-pay | Admitting: Neurology

## 2020-10-14 NOTE — Telephone Encounter (Signed)
Pt's husband called in wanting to see if he could get her dosage of the buspirone increased? She has been having increased anxiety. She has been trying to climb out of bed and pulling at her catheter.

## 2020-10-15 ENCOUNTER — Telehealth: Payer: Self-pay

## 2020-10-15 MED ORDER — BUSPIRONE HCL 15 MG PO TABS: 15.0000 mg | ORAL_TABLET | Freq: Three times a day (TID) | ORAL | 4 refills | Status: DC

## 2020-10-15 NOTE — Telephone Encounter (Signed)
New message   Office notes and referral fax to [facility]   Fax # [phone]

## 2020-10-15 NOTE — Telephone Encounter (Signed)
OK to increase buspirone to 15mg  three times daily.  Pt husband advised, new script sent Buspirone 15 mg #90 with 4 refills sent

## 2020-10-23 ENCOUNTER — Telehealth: Payer: Self-pay

## 2020-10-23 ENCOUNTER — Telehealth: Payer: Self-pay | Admitting: Neurology

## 2020-10-23 NOTE — Telephone Encounter (Signed)
Per My chart message from the husband  2. She seems to be developing sundowners. Nor every afternoon but frequently.  Very hard to manage and I'm fearful she's going to scare off her caregivers. We can't  afford that! What do you recommend?    Husband also wanted to know if Dr.Jaffe would be okay with switching pt Buspar to Ativan. The pharmacist advised that the pt may do better on that. And the husband wanted to see if she could try that. Pt has become more combative with him as well as the Aide and he is afraid that they will lose the Aide.   Please advise.

## 2020-10-23 NOTE — Telephone Encounter (Signed)
Pt husband is calling back to let Dr Everlena Cooper know how patient is doing on the seizure medication (Bridge medication ) he states she is doing ok and the diarrhea has  gotten better  please call patient husband to speak to him about how patient is doing and the next step

## 2020-10-23 NOTE — Telephone Encounter (Signed)
Also spoke to Pt husband, They never heard from [facility]. [facility] unable to see pt. They do not have enough staff to see pt.  Per husband that is okay Dr.Stoneking to add a referral as well and [facility] came out to evaluate pt for Speech therapy,Wound care OT,and PT.

## 2020-10-23 NOTE — Telephone Encounter (Signed)
Kelly Cole should now be off of Depakote.  Once she has been on lamotrigine 50mg  twice daily for 2 weeks, she may discontinue levetiracetam and continue lamotrigine 50mg  twice daily.      Pt will discontinue the Keppra today. And continue the Lamotrigine 50 mg BID

## 2020-10-24 ENCOUNTER — Other Ambulatory Visit: Payer: Self-pay | Admitting: Neurology

## 2020-10-24 DIAGNOSIS — Z993 Dependence on wheelchair: Secondary | ICD-10-CM | POA: Diagnosis not present

## 2020-10-24 DIAGNOSIS — I10 Essential (primary) hypertension: Secondary | ICD-10-CM | POA: Diagnosis not present

## 2020-10-24 DIAGNOSIS — F330 Major depressive disorder, recurrent, mild: Secondary | ICD-10-CM | POA: Diagnosis not present

## 2020-10-24 DIAGNOSIS — L89152 Pressure ulcer of sacral region, stage 2: Secondary | ICD-10-CM | POA: Diagnosis not present

## 2020-10-24 DIAGNOSIS — L89612 Pressure ulcer of right heel, stage 2: Secondary | ICD-10-CM | POA: Diagnosis not present

## 2020-10-24 DIAGNOSIS — J690 Pneumonitis due to inhalation of food and vomit: Secondary | ICD-10-CM | POA: Diagnosis not present

## 2020-10-24 DIAGNOSIS — R251 Tremor, unspecified: Secondary | ICD-10-CM | POA: Diagnosis not present

## 2020-10-24 DIAGNOSIS — G250 Essential tremor: Secondary | ICD-10-CM | POA: Diagnosis not present

## 2020-10-24 DIAGNOSIS — Z4800 Encounter for change or removal of nonsurgical wound dressing: Secondary | ICD-10-CM | POA: Diagnosis not present

## 2020-10-24 DIAGNOSIS — R569 Unspecified convulsions: Secondary | ICD-10-CM | POA: Diagnosis not present

## 2020-10-24 DIAGNOSIS — M479 Spondylosis, unspecified: Secondary | ICD-10-CM | POA: Diagnosis not present

## 2020-10-24 DIAGNOSIS — G35 Multiple sclerosis: Secondary | ICD-10-CM | POA: Diagnosis not present

## 2020-10-24 DIAGNOSIS — Z9350 Unspecified cystostomy status: Secondary | ICD-10-CM | POA: Diagnosis not present

## 2020-10-24 MED ORDER — LORAZEPAM 0.5 MG PO TABS: 0.5000 mg | ORAL_TABLET | Freq: Two times a day (BID) | ORAL | 3 refills | Status: DC | PRN

## 2020-10-24 NOTE — Telephone Encounter (Signed)
Pt husband advised of Dr.Jaffe note, I sent a prescription for Ativan (lorazepam) 0.5mg  twice daily as needed to [facility] on [address].  Advise caution as it causes drowsiness.

## 2020-10-27 NOTE — Progress Notes (Signed)
[facility] COMMUNITY PALLIATIVE CARE RN NOTE  PATIENT NAME: Kelly Cole DOB: February 19, 1940 MRN: [id number]  PRIMARY CARE PROVIDER: [name], MD  RESPONSIBLE PARTY: Aggie Moats (husband) Acct ID - Guarantor Home Phone Work Phone Relationship Acct Type  [id number] TIYANNA, [phone]  Self P/F     [address] [place], [place] [address]   Covid-19 Pre-screening Negative  PLAN OF CARE and INTERVENTION:  ADVANCE CARE PLANNING/GOALS OF CARE: Goal is for patient to remain in her home and avoid hospitalizations.  PATIENT/CAREGIVER EDUCATION: Explained palliative care services, symptom management, safe transfers, management of skin breakdown, s/s of infection DISEASE STATUS: Joint initial palliative care visit completed with LCSW, M. Lonon. Met with patient, husband and hired caregiver in their home. Patient has a diagnosis of multiple sclerosis, and also has issues with scoliosis. Patient is lying in bed awake. She is alert and oriented x 3, pleasant and engaging. She denies pain at this time. She is finishing up an antibiotic, tomorrow being the last day, for aspiration pneumonia. She has been having issues with diarrhea husband feels may be exacerbated by patient being on antibiotics. He gives patient 1-2 packs of Questran daily and Imodium. He says that episodes of diarrhea have lessened. She requires assistance with all ADLs. She is transferred via [facility] lift to wheelchair as she is non-ambulatory. She has a hired caregiver through [employer] to assist patient with personal care needs. She has a suprapubic catheter. She is on a mechanical soft diet with nectar thickened liquids. Husband is encouraging patient to drink more fluids. She is incontinent of bowels. Last bowel movement was today. Husband states that Dr. Felipa Eth just put in an order for home health PT/ST/OT/RN. Patient needs assistance for wound management and catheter care. She has a stage 1 sacral wound, a wound to her right  shin (current dressing is dry and intact) and a blister noted to her right heel. Assisted husband in changing bandage to right heel and placed bunny boots on patient. Also elevated her legs and placed a wedge cushion underneath her legs/heels to prevent them touching the bed. Patient and husband are agreeable to future visits by palliative care. Consent signed.   HISTORY OF PRESENT ILLNESS: This is a 80 yo female with a diagnosis of multiple sclerosis. He has a history of essential tremor, hypertension, seizures, degenerative disk disease and scoliosis. Palliative care team has been asked to follow patient for assistance with symptom management, goals of care and complex decision making.   CODE STATUS: DNR ADVANCED DIRECTIVES: Y MOST FORM: yes PPS: 30%   PHYSICAL EXAM:   LUNGS: clear to auscultation  CARDIAC: Cor RRR EXTREMITIES: No edema SKIN:  Scattered bruising to bilateral upper and lower extremities; See above note regarding wounds   NEURO:  Alert and oriented x 3, pleasant mood, increased generalized weakness, non-ambulatory   (Duration of visit and documentation 60 minutes)   Daryl Eastern, RN BSN

## 2020-10-29 DIAGNOSIS — R338 Other retention of urine: Secondary | ICD-10-CM | POA: Diagnosis not present

## 2020-10-31 DIAGNOSIS — G35 Multiple sclerosis: Secondary | ICD-10-CM | POA: Diagnosis not present

## 2020-10-31 DIAGNOSIS — G250 Essential tremor: Secondary | ICD-10-CM | POA: Diagnosis not present

## 2020-10-31 DIAGNOSIS — R251 Tremor, unspecified: Secondary | ICD-10-CM | POA: Diagnosis not present

## 2020-10-31 DIAGNOSIS — F330 Major depressive disorder, recurrent, mild: Secondary | ICD-10-CM | POA: Diagnosis not present

## 2020-10-31 DIAGNOSIS — Z9350 Unspecified cystostomy status: Secondary | ICD-10-CM | POA: Diagnosis not present

## 2020-10-31 DIAGNOSIS — Z993 Dependence on wheelchair: Secondary | ICD-10-CM | POA: Diagnosis not present

## 2020-10-31 DIAGNOSIS — L89612 Pressure ulcer of right heel, stage 2: Secondary | ICD-10-CM | POA: Diagnosis not present

## 2020-10-31 DIAGNOSIS — R569 Unspecified convulsions: Secondary | ICD-10-CM | POA: Diagnosis not present

## 2020-10-31 DIAGNOSIS — L89152 Pressure ulcer of sacral region, stage 2: Secondary | ICD-10-CM | POA: Diagnosis not present

## 2020-10-31 DIAGNOSIS — J690 Pneumonitis due to inhalation of food and vomit: Secondary | ICD-10-CM | POA: Diagnosis not present

## 2020-10-31 DIAGNOSIS — M479 Spondylosis, unspecified: Secondary | ICD-10-CM | POA: Diagnosis not present

## 2020-10-31 DIAGNOSIS — Z4800 Encounter for change or removal of nonsurgical wound dressing: Secondary | ICD-10-CM | POA: Diagnosis not present

## 2020-10-31 DIAGNOSIS — I10 Essential (primary) hypertension: Secondary | ICD-10-CM | POA: Diagnosis not present

## 2020-11-05 DIAGNOSIS — Z9350 Unspecified cystostomy status: Secondary | ICD-10-CM | POA: Diagnosis not present

## 2020-11-05 DIAGNOSIS — Z993 Dependence on wheelchair: Secondary | ICD-10-CM | POA: Diagnosis not present

## 2020-11-05 DIAGNOSIS — F330 Major depressive disorder, recurrent, mild: Secondary | ICD-10-CM | POA: Diagnosis not present

## 2020-11-05 DIAGNOSIS — I10 Essential (primary) hypertension: Secondary | ICD-10-CM | POA: Diagnosis not present

## 2020-11-05 DIAGNOSIS — Z4800 Encounter for change or removal of nonsurgical wound dressing: Secondary | ICD-10-CM | POA: Diagnosis not present

## 2020-11-05 DIAGNOSIS — J690 Pneumonitis due to inhalation of food and vomit: Secondary | ICD-10-CM | POA: Diagnosis not present

## 2020-11-05 DIAGNOSIS — L89612 Pressure ulcer of right heel, stage 2: Secondary | ICD-10-CM | POA: Diagnosis not present

## 2020-11-05 DIAGNOSIS — L89152 Pressure ulcer of sacral region, stage 2: Secondary | ICD-10-CM | POA: Diagnosis not present

## 2020-11-05 DIAGNOSIS — G250 Essential tremor: Secondary | ICD-10-CM | POA: Diagnosis not present

## 2020-11-05 DIAGNOSIS — R569 Unspecified convulsions: Secondary | ICD-10-CM | POA: Diagnosis not present

## 2020-11-05 DIAGNOSIS — R251 Tremor, unspecified: Secondary | ICD-10-CM | POA: Diagnosis not present

## 2020-11-05 DIAGNOSIS — G35 Multiple sclerosis: Secondary | ICD-10-CM | POA: Diagnosis not present

## 2020-11-05 DIAGNOSIS — M479 Spondylosis, unspecified: Secondary | ICD-10-CM | POA: Diagnosis not present

## 2020-11-07 DIAGNOSIS — L89612 Pressure ulcer of right heel, stage 2: Secondary | ICD-10-CM | POA: Diagnosis not present

## 2020-11-07 DIAGNOSIS — R251 Tremor, unspecified: Secondary | ICD-10-CM | POA: Diagnosis not present

## 2020-11-07 DIAGNOSIS — Z4800 Encounter for change or removal of nonsurgical wound dressing: Secondary | ICD-10-CM | POA: Diagnosis not present

## 2020-11-07 DIAGNOSIS — G35 Multiple sclerosis: Secondary | ICD-10-CM | POA: Diagnosis not present

## 2020-11-07 DIAGNOSIS — I10 Essential (primary) hypertension: Secondary | ICD-10-CM | POA: Diagnosis not present

## 2020-11-07 DIAGNOSIS — L89152 Pressure ulcer of sacral region, stage 2: Secondary | ICD-10-CM | POA: Diagnosis not present

## 2020-11-07 DIAGNOSIS — Z9350 Unspecified cystostomy status: Secondary | ICD-10-CM | POA: Diagnosis not present

## 2020-11-07 DIAGNOSIS — Z993 Dependence on wheelchair: Secondary | ICD-10-CM | POA: Diagnosis not present

## 2020-11-07 DIAGNOSIS — M479 Spondylosis, unspecified: Secondary | ICD-10-CM | POA: Diagnosis not present

## 2020-11-07 DIAGNOSIS — F330 Major depressive disorder, recurrent, mild: Secondary | ICD-10-CM | POA: Diagnosis not present

## 2020-11-07 DIAGNOSIS — R569 Unspecified convulsions: Secondary | ICD-10-CM | POA: Diagnosis not present

## 2020-11-07 DIAGNOSIS — J690 Pneumonitis due to inhalation of food and vomit: Secondary | ICD-10-CM | POA: Diagnosis not present

## 2020-11-07 DIAGNOSIS — G250 Essential tremor: Secondary | ICD-10-CM | POA: Diagnosis not present

## 2020-11-08 DIAGNOSIS — F330 Major depressive disorder, recurrent, mild: Secondary | ICD-10-CM | POA: Diagnosis not present

## 2020-11-08 DIAGNOSIS — L89152 Pressure ulcer of sacral region, stage 2: Secondary | ICD-10-CM | POA: Diagnosis not present

## 2020-11-08 DIAGNOSIS — L89612 Pressure ulcer of right heel, stage 2: Secondary | ICD-10-CM | POA: Diagnosis not present

## 2020-11-08 DIAGNOSIS — I10 Essential (primary) hypertension: Secondary | ICD-10-CM | POA: Diagnosis not present

## 2020-11-08 DIAGNOSIS — Z4800 Encounter for change or removal of nonsurgical wound dressing: Secondary | ICD-10-CM | POA: Diagnosis not present

## 2020-11-08 DIAGNOSIS — J690 Pneumonitis due to inhalation of food and vomit: Secondary | ICD-10-CM | POA: Diagnosis not present

## 2020-11-08 DIAGNOSIS — Z9350 Unspecified cystostomy status: Secondary | ICD-10-CM | POA: Diagnosis not present

## 2020-11-08 DIAGNOSIS — R569 Unspecified convulsions: Secondary | ICD-10-CM | POA: Diagnosis not present

## 2020-11-08 DIAGNOSIS — Z993 Dependence on wheelchair: Secondary | ICD-10-CM | POA: Diagnosis not present

## 2020-11-08 DIAGNOSIS — R251 Tremor, unspecified: Secondary | ICD-10-CM | POA: Diagnosis not present

## 2020-11-08 DIAGNOSIS — G35 Multiple sclerosis: Secondary | ICD-10-CM | POA: Diagnosis not present

## 2020-11-08 DIAGNOSIS — M479 Spondylosis, unspecified: Secondary | ICD-10-CM | POA: Diagnosis not present

## 2020-11-08 DIAGNOSIS — G250 Essential tremor: Secondary | ICD-10-CM | POA: Diagnosis not present

## 2020-11-12 DIAGNOSIS — Z9350 Unspecified cystostomy status: Secondary | ICD-10-CM | POA: Diagnosis not present

## 2020-11-12 DIAGNOSIS — R569 Unspecified convulsions: Secondary | ICD-10-CM | POA: Diagnosis not present

## 2020-11-12 DIAGNOSIS — Z4800 Encounter for change or removal of nonsurgical wound dressing: Secondary | ICD-10-CM | POA: Diagnosis not present

## 2020-11-12 DIAGNOSIS — M479 Spondylosis, unspecified: Secondary | ICD-10-CM | POA: Diagnosis not present

## 2020-11-12 DIAGNOSIS — G35 Multiple sclerosis: Secondary | ICD-10-CM | POA: Diagnosis not present

## 2020-11-12 DIAGNOSIS — J690 Pneumonitis due to inhalation of food and vomit: Secondary | ICD-10-CM | POA: Diagnosis not present

## 2020-11-12 DIAGNOSIS — I10 Essential (primary) hypertension: Secondary | ICD-10-CM | POA: Diagnosis not present

## 2020-11-12 DIAGNOSIS — Z993 Dependence on wheelchair: Secondary | ICD-10-CM | POA: Diagnosis not present

## 2020-11-12 DIAGNOSIS — F330 Major depressive disorder, recurrent, mild: Secondary | ICD-10-CM | POA: Diagnosis not present

## 2020-11-12 DIAGNOSIS — R251 Tremor, unspecified: Secondary | ICD-10-CM | POA: Diagnosis not present

## 2020-11-12 DIAGNOSIS — G250 Essential tremor: Secondary | ICD-10-CM | POA: Diagnosis not present

## 2020-11-12 DIAGNOSIS — L89612 Pressure ulcer of right heel, stage 2: Secondary | ICD-10-CM | POA: Diagnosis not present

## 2020-11-12 DIAGNOSIS — L89152 Pressure ulcer of sacral region, stage 2: Secondary | ICD-10-CM | POA: Diagnosis not present

## 2020-11-13 ENCOUNTER — Telehealth: Payer: Self-pay

## 2020-11-13 ENCOUNTER — Other Ambulatory Visit: Payer: Self-pay | Admitting: Neurology

## 2020-11-13 NOTE — Telephone Encounter (Signed)
(  1:57 pm) SW returned call to patient's husband-Kelly Cole. He advised that patient has not been doing well and he was advised to switch from palliative to hospice care. Kelly Cole does not feel patient is terminal, but feels he would get more care. SW provided education to him regarding hospice care and eligibility criteria. He reports that patient continues to receive PT, OT and ST through [facility]. Her appetite remains good. She does cry a lot and she has had a bout with diarrhea. SW scheduled a visit with patient for 11/17/20 @ 1:30 pm. *Palliative care RN will be updated for follow-up

## 2020-11-14 DIAGNOSIS — R338 Other retention of urine: Secondary | ICD-10-CM | POA: Diagnosis not present

## 2020-11-14 DIAGNOSIS — G35 Multiple sclerosis: Secondary | ICD-10-CM | POA: Diagnosis not present

## 2020-11-14 DIAGNOSIS — R8279 Other abnormal findings on microbiological examination of urine: Secondary | ICD-10-CM | POA: Diagnosis not present

## 2020-11-17 ENCOUNTER — Other Ambulatory Visit: Payer: Self-pay

## 2020-11-17 ENCOUNTER — Other Ambulatory Visit: Payer: PPO | Admitting: *Deleted

## 2020-11-17 ENCOUNTER — Other Ambulatory Visit: Payer: PPO

## 2020-11-17 DIAGNOSIS — Z515 Encounter for palliative care: Secondary | ICD-10-CM

## 2020-11-17 NOTE — Progress Notes (Signed)
COMMUNITY PALLIATIVE CARE SW NOTE  PATIENT NAME: Kelly Cole DOB: 04/06/1940 MRN: [id number]  PRIMARY CARE PROVIDER: Merlene Laughter, MD  RESPONSIBLE PARTY:  Acct ID - Guarantor Home Phone Work Phone Relationship Acct Type  [id number] [name], [name]* [phone]  Self P/F     [address] [place], [place] [address]     PLAN OF CARE and INTERVENTIONS:             GOALS OF CARE/ ADVANCE CARE PLANNING:  Goal is for patient to transition to hospice and remain in her home. Patient is a DNR.  SOCIAL/EMOTIONAL/SPIRITUAL ASSESSMENT/ INTERVENTIONS:  SW and RN- M. Dimas Aguas completed a follow-up check-in visit with patient at her home. She was present with her husband and sitter-[name]. SW and RN talked with husband-[name] prior to going in to visit patient as he is requesting patient be assessed for hospice as he feels her condition has deteriorated to where hospice is needed and she will receive more care. [name] advised that patient is now unable to verbalize her need or state pain. Patient is more confused and her verbalizations are more delayed, scattered and nonsensical. She continues to be incontinent and has diarrhea. She is having increased confusion with agitation where she tries get out of bed, particularly in the evenings that he contribute to sundowning. Patient is total care, with her husband serving as primary caregiver and private caregivers a few hours during the day. Her husband reported that he has observed episodes where he observed patient's eyes rolled to the back of her head. Patient is eating 2 meals a day of small pureed food with nectar thick liquids, but her appetite remains significantly declined. Patient is sleeping 16 hours a day. Patient will sleep unless she is aroused to eat and do personal care. Patient has a stage three pressure ulcer on her sacrum and several skin tears as a result of thin skin. Her pills are crushed in bananas to be administered. Patient was able to feed herself  a month ago, but is unable to now due to increased tremors.  SW and RN provided education to patient's husband regarding hospice, eligibility and referral process, reiterating that patient will no longer able to get therapy is hospice is chosen. [name] verbalized understanding. SW and RN visited patient in her room. She was in bed sleeping. Patient appeared to be sleepy and was having difficulty staying awake or being responsive to simple yes/no questions. Her husband attempted to give her something to drink, but patient was unable to pull anything up. RN to follow-up with palliative care director regarding appropriateness. PCG remains open to ongoing support.  PATIENT/CAREGIVER EDUCATION/ COPING:  Patient and her husband appear to be coping well. They have a supportive network of friends and neighbors PERSONAL EMERGENCY PLAN:  911 can abe accessed for emergencies. COMMUNITY RESOURCES COORDINATION/ HEALTH CARE NAVIGATION:  Patient has private sitters. FINANCIAL/LEGAL CONCERNS/INTERVENTIONS:  None.     SOCIAL HX:  Social History   Tobacco Use   Smoking status: Never   Smokeless tobacco: Never  Substance Use Topics   Alcohol use: Yes    CODE STATUS: DNR ADVANCED DIRECTIVES: Yes MOST FORM COMPLETE:  Yes HOSPICE EDUCATION PROVIDED: Yes, patient to further assessed.  PPS: Patient is alert and oriented to self and situation. She is dependent for all ADL's. She is bedbound.   Duration of  visit and documentation: 60 minutes      Clydia Llano, LCSW

## 2020-11-17 NOTE — Progress Notes (Signed)
[facility] COMMUNITY PALLIATIVE CARE RN NOTE  PATIENT NAME: Kelly Cole DOB: [birth year] MRN: [id number]  PRIMARY CARE PROVIDER: Merlene Laughter, MD  RESPONSIBLE PARTY: Margaretha Sheffield (husband) Acct ID - Guarantor Home Phone Work Phone Relationship Acct Type  [id number] TIFFANCY, MOGER* [id number]  Self P/F     [address] [place], [place] [address]   Covid-19 Pre-screening Negative  PLAN OF CARE and INTERVENTION:  ADVANCE CARE PLANNING/GOALS OF CARE: Goal is for patient to remain in her home. She has a DNR. PATIENT/CAREGIVER EDUCATION: Symptom management, hospice education DISEASE STATUS: Joint visit made with LCSW, M. Lonon. Patient's husband [name] requested a visit to discuss hospice eligibility. He says that he is noticing increased confusion, especially in the evenings. He feels that she has a delirium as she does not always recognize him as her husband, calls him daddy, does not recognize her home and often thinks her deceased mother and father is in the home. She is having difficulty making decisions and unable to reason. She does have some agitation at times and tires to get out of bed by swinging her legs off to the side. He feels Lorazepam 0.5 mg is not helping. She has been having drastic mood swings. She has times where she stares off in apace. She had a seizure back on 09/21/20 with possible aspiration pneumonia. He feels she has never bounced back from this. He feels she may be having petit mal seizures on occasion. She has a suprapubic catheter and he feels she may have a UTI. He was able to get her to the Urologist on 11/14/20 and cultures are pending. Patient lethargic and could barely keep her eyes open during visit. She appears comfortable. She has been having issues with ongoing diarrhea. He has increased her Questran to twice daily to see if this will help. She also takes Imodium 3x/day. She is incontinent of bowel. She is total care with all ADLs. She is non-ambulatory. She has a  hired caregiver through [employer] during the day. She is no longer able to feed herself independently due to worsening tremors. She is on a mechanical soft diet and nectar thick liquids since she is high risk for aspiration. Her meds are given crushed in pureed bananas. He is trying to push fluids. During visit, she was able to sip fluids once through a straw but then would not sip anymore as she fell back asleep instantly. She has apparent generalized muscle wasting. She has a Stage 3 to her sacrum. Husband states this is from her having severe scoliosis. She is currently receiving ST and RN through [facility]. Informed that if patient was to receive hospice services she would have to be discharged from home health services. He is agreeable to this. Discussed hospice eligibility with our medical director, Dr. Jamie Brookes who feels she meets hospice criteria. Called and left a message for Dr. Laverle Hobby nurse requesting a verbal order for a hospice consult and if he will agree in being her attending while under hospice care. Awaiting return call. Made husband aware. He is Adult nurse.   HISTORY OF PRESENT ILLNESS:   This is a 80 yo female with a diagnosis of multiple sclerosis. He has a history of essential tremor, hypertension, seizures, degenerative disk disease and scoliosis. Palliative care has contacted PCP to request order for a hospice consult.  CODE STATUS: DNR  ADVANCED DIRECTIVES: Y MOST FORM: yes PPS: 30%   (Duration of visit and documentation 60 minutes)   Candiss Norse, RN BSN

## 2020-11-18 ENCOUNTER — Telehealth: Payer: Self-pay | Admitting: *Deleted

## 2020-11-18 DIAGNOSIS — Z9350 Unspecified cystostomy status: Secondary | ICD-10-CM | POA: Diagnosis not present

## 2020-11-18 DIAGNOSIS — I10 Essential (primary) hypertension: Secondary | ICD-10-CM | POA: Diagnosis not present

## 2020-11-18 DIAGNOSIS — M479 Spondylosis, unspecified: Secondary | ICD-10-CM | POA: Diagnosis not present

## 2020-11-18 DIAGNOSIS — Z993 Dependence on wheelchair: Secondary | ICD-10-CM | POA: Diagnosis not present

## 2020-11-18 DIAGNOSIS — G250 Essential tremor: Secondary | ICD-10-CM | POA: Diagnosis not present

## 2020-11-18 DIAGNOSIS — R251 Tremor, unspecified: Secondary | ICD-10-CM | POA: Diagnosis not present

## 2020-11-18 DIAGNOSIS — F330 Major depressive disorder, recurrent, mild: Secondary | ICD-10-CM | POA: Diagnosis not present

## 2020-11-18 DIAGNOSIS — L89612 Pressure ulcer of right heel, stage 2: Secondary | ICD-10-CM | POA: Diagnosis not present

## 2020-11-18 DIAGNOSIS — G35 Multiple sclerosis: Secondary | ICD-10-CM | POA: Diagnosis not present

## 2020-11-18 DIAGNOSIS — R569 Unspecified convulsions: Secondary | ICD-10-CM | POA: Diagnosis not present

## 2020-11-18 DIAGNOSIS — L89152 Pressure ulcer of sacral region, stage 2: Secondary | ICD-10-CM | POA: Diagnosis not present

## 2020-11-18 DIAGNOSIS — J690 Pneumonitis due to inhalation of food and vomit: Secondary | ICD-10-CM | POA: Diagnosis not present

## 2020-11-18 DIAGNOSIS — Z4800 Encounter for change or removal of nonsurgical wound dressing: Secondary | ICD-10-CM | POA: Diagnosis not present

## 2020-11-18 NOTE — Telephone Encounter (Signed)
9:32a Received a return call from [place], CMA with Dr. Laverle Hobby office. She advised that Dr. Pete Glatter gave the verbal order for a hospice consult and agrees to be the attending while patient is under hospice care. I sent this order over to [facility]'s referral center for processing.

## 2020-11-19 DIAGNOSIS — R197 Diarrhea, unspecified: Secondary | ICD-10-CM | POA: Diagnosis not present

## 2020-11-25 DIAGNOSIS — M479 Spondylosis, unspecified: Secondary | ICD-10-CM | POA: Diagnosis not present

## 2020-11-25 DIAGNOSIS — J690 Pneumonitis due to inhalation of food and vomit: Secondary | ICD-10-CM | POA: Diagnosis not present

## 2020-11-25 DIAGNOSIS — I10 Essential (primary) hypertension: Secondary | ICD-10-CM | POA: Diagnosis not present

## 2020-11-25 DIAGNOSIS — Z9350 Unspecified cystostomy status: Secondary | ICD-10-CM | POA: Diagnosis not present

## 2020-11-25 DIAGNOSIS — F330 Major depressive disorder, recurrent, mild: Secondary | ICD-10-CM | POA: Diagnosis not present

## 2020-11-25 DIAGNOSIS — R251 Tremor, unspecified: Secondary | ICD-10-CM | POA: Diagnosis not present

## 2020-11-25 DIAGNOSIS — G35 Multiple sclerosis: Secondary | ICD-10-CM | POA: Diagnosis not present

## 2020-11-25 DIAGNOSIS — L89612 Pressure ulcer of right heel, stage 2: Secondary | ICD-10-CM | POA: Diagnosis not present

## 2020-11-25 DIAGNOSIS — Z4800 Encounter for change or removal of nonsurgical wound dressing: Secondary | ICD-10-CM | POA: Diagnosis not present

## 2020-11-25 DIAGNOSIS — Z993 Dependence on wheelchair: Secondary | ICD-10-CM | POA: Diagnosis not present

## 2020-11-25 DIAGNOSIS — R569 Unspecified convulsions: Secondary | ICD-10-CM | POA: Diagnosis not present

## 2020-11-25 DIAGNOSIS — G250 Essential tremor: Secondary | ICD-10-CM | POA: Diagnosis not present

## 2020-11-25 DIAGNOSIS — L89152 Pressure ulcer of sacral region, stage 2: Secondary | ICD-10-CM | POA: Diagnosis not present

## 2020-11-25 NOTE — Progress Notes (Signed)
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  She is on multiple medications which she may not need.  I will have her follow up in 3 months.  If still doing well, will consider tapering off of quetiapine. Continue lamotrigine 50mg  twice daily Lorazepam 0.5 to 1mg  twice daily as needed.  Caution for increased confusion/drowsiness.  She rarely takes it. Follow up 3 months.  Subjective:  Kelly Cole is a 80 year old white female with multiple sclerosis, depression and chronic low back pain who follows up for new-onset seizure.  History obtained by her husband and reviewing hospital records.   UPDATE: Current medications:  lamotrigine 50mg  twice daily. Lorazepam 0.5mg  twice daily PRN, Buspirone 15mg  three times daily, quetiapine 75mg  at bedtime, temazepam 30mg  at bedtime, venlafaxine 100mg  three times daily.  Due to lethargy and diarrhea, She was transitioned from Depakote to Lamictal with Keppra as a bridge in September.  Diarrhea improved but has since had subsequent episodes of diarrhea.  She is now on maximum Questran.  Continues on Buspar.  Ativan 0.5mg  twice daily as needed for anxiety.  Her husband thinks she responds better to 1mg .  Palliative care was ordered.  Overall improved.  Agitation occurs 2-3 times a week and lasts for about 10 minutes.  She will typically try climbing out of bed.  She is less lethargic overall.  Sleeps less in the morning (wakes up earlier).  Still sleeps well during the night.  Less delirium.  She was able to drink thin liquids yesterday.  She is taking bigger bites of solid food.  Speech therapist wants to repeat MBS.       HISTORY: As a teenager, she was hospitalized for bilateral leg weakness.  They thought Polio but never had a diagnosis.  Symptoms resolved.  She was diagnosed with MS in 1970 while in 77 after experiencing numbness and tingling in her fingers.  She may have received visual evoked potentials but was primarily diagnosed based on clinical findings.  Over the next decade, she gradually had decline in gait, developing a foot drop which caused her to trip.  In the mid-late 70s, she participated in a trial where she was treated with chlorambucil and drop foot resolved.  High doses of vitamin injections (C, B1, B2, B12, etc)   Over the last 10-15 years, she has had a gradual decline in ambulation.  She progressed from cane to walker about 10 years ago.  She has been in a wheelchair for the past 6 years, following a hemicolectomy.  MRI of brain from 2019 showed chronic demyelination.  She was admitted to [facility] on 09/20/2020 for seizure-like activity.  She was found by her husband unresponsive with roving eye movements rolled back and restless non-purposeful movements of all extremities.  Lasted 45-60 seconds.  Afterwards, she was lethargic and then exhibited teeth grinding with flailing of her arms.  EMS was called and she was brought to the ED.  Neurologic exam demonstrated bilateral lower extremity contractures without antigravity movement and brief coarsse tremor of the left upper extremity.  DTRs were 3+ throughout.  CT head personally reviewed showed left basal ganglia lacunar infarct but no  acute intracranial abnormality.  EEG showed diffuse encephalopathy but no epileptiform discharges or electrographic seizures.  She was started on antibiotics for suspected aspiration pneumonia.  Urine culture was negative.  K and Mg were 3.3 and 1.4 respectively.  Na fell between 127-131.  She takes temazepam at home, which was switched to Ativan during hospitalization in order to prevent withdrawal.   He was discharged on Depakote sprinkles 500mg  every 12 hours.  Wellbutrin was discontinued.    Past disease modifying therapy:  Betaseron (for 1 year, stopped due to side effects and ineffective) Other past medications:  Depakote (for seizure - lethargy and diarrhea)  PAST MEDICAL HISTORY: Past Medical History:  Diagnosis Date   HTN (hypertension)    Multiple sclerosis (HCC)     MEDICATIONS: Current Outpatient Medications on File Prior to Visit  Medication Sig Dispense Refill   acetaminophen (TYLENOL) 325 MG tablet Take 2 tablets (650 mg total) by mouth every 6 (six) hours as needed for mild pain (or Fever >/= 101). 240 tablet 0   amLODipine (NORVASC) 5 MG tablet Take 1 tablet (5 mg total) by mouth at bedtime. 30 tablet 0   busPIRone (BUSPAR) 15 MG tablet Take 1 tablet (15 mg total) by mouth 3 (three) times daily. 90 tablet 4   cholecalciferol (VITAMIN D3) 25 MCG (1000 UNIT) tablet Take 2,000 Units by mouth 3 (three) times daily.     cholestyramine (QUESTRAN) 4 g packet Take 4 g by mouth 2 (two) times daily before lunch and supper.     divalproex (DEPAKOTE SPRINKLE) 125 MG capsule Take 4 capsules (500 mg total) by mouth every 12 (twelve) hours. 240 capsule 0   lamoTRIgine (LAMICTAL) 25 MG tablet TAKE 1 TABLET BY MOUTH TWICE DAILY FOR 14 DAYS. THEN INCREASE TO 2 TABLETS TWICE A DAY 120 tablet 0   levETIRAcetam (KEPPRA) 500 MG tablet Take 1 tablet (500 mg total) by mouth 2 (two) times daily. 90 tablet 0   loperamide (IMODIUM) 2 MG capsule Take 1 capsule (2 mg total) by mouth 3 (three) times daily as needed for diarrhea or loose stools. 30 capsule 0   LORazepam (ATIVAN) 0.5 MG tablet Take 1 tablet (0.5 mg total) by mouth 2 (two) times daily as needed for anxiety. 60 tablet 3   pantoprazole sodium (PROTONIX) 40 mg PACK Take 20 mLs (40 mg total) by mouth daily. 30 packet 0   polyethylene glycol powder (GLYCOLAX/MIRALAX) 17 GM/SCOOP powder Dissolve 1 capful (17 g) in water and drink daily as  needed for mild constipation. 510 g 0   propranolol (INDERAL) 40 MG tablet Take 1 tablet (40 mg total) by mouth 2 (two) times daily. 60 tablet 0   QUEtiapine (SEROQUEL) 25 MG tablet Take 3 tablets (75 mg total) by mouth at bedtime. 90 tablet 0   temazepam (RESTORIL) 15 MG capsule Take 30 mg by mouth at bedtime.     venlafaxine (EFFEXOR) 100 MG tablet Take 1 tablet (100 mg total) by mouth 3 (three) times daily with meals. 90 tablet 0   No current facility-administered medications on file prior to visit.    ALLERGIES: Allergies  Allergen Reactions   Demerol  [Meperidine] Other (See Comments)    FAMILY HISTORY: No family history on file.    Objective:  Blood pressure 100/71, pulse (!) 55, height 5' (1.524 m), weight 120 lb (54.4 kg), SpO2 99 %. General: No acute distress.  Patient appears well-groomed.   Head:  Normocephalic/atraumatic Eyes:  Fundi examined but not visualized  Neck: supple, no paraspinal tenderness, full range of motion Heart:  Regular rate and rhythm Lungs:  Clear to auscultation bilaterally Back: No paraspinal tenderness Neurological Exam:  MMSE - Mini Mental State Exam 11/26/2020  Orientation to time 0  Orientation to Place 4  Registration 3  Attention/ Calculation 5  Recall 2  Language- name 2 objects 2  Language- repeat 1  Language- follow 3 step command 3  Language- read & follow direction 1  Write a sentence 1  Copy design 1  Total score 23   CN II-XII intact. Decreased bulk and increased tone.  Motor strength 4/5 upper extremities, 2/5 lower extremities.  Tremor of head.  Finger to nose with bilateral ataxic tremor.  No tremor at rest.  DTR 2+ throughout.  Wheelchair-bound.   Shon Millet, DO  CC: Merlene Laughter, MD  Total of 50 minutes spent with patient.

## 2020-11-26 ENCOUNTER — Ambulatory Visit: Payer: PPO | Admitting: Neurology

## 2020-11-26 ENCOUNTER — Other Ambulatory Visit: Payer: Self-pay

## 2020-11-26 ENCOUNTER — Encounter: Payer: Self-pay | Admitting: Neurology

## 2020-11-26 VITALS — BP 100/71 | HR 55 | Ht 60.0 in | Wt 120.0 lb

## 2020-11-26 DIAGNOSIS — R569 Unspecified convulsions: Secondary | ICD-10-CM | POA: Diagnosis not present

## 2020-11-26 DIAGNOSIS — G252 Other specified forms of tremor: Secondary | ICD-10-CM | POA: Diagnosis not present

## 2020-11-26 DIAGNOSIS — R419 Unspecified symptoms and signs involving cognitive functions and awareness: Secondary | ICD-10-CM

## 2020-11-26 DIAGNOSIS — G35 Multiple sclerosis: Secondary | ICD-10-CM | POA: Diagnosis not present

## 2020-11-26 MED ORDER — LORAZEPAM 0.5 MG PO TABS
ORAL_TABLET | ORAL | 3 refills | Status: AC
Start: 2020-11-26 — End: ?

## 2020-11-26 NOTE — Patient Instructions (Signed)
No change in management right now Follow up 3 months.

## 2020-11-28 DIAGNOSIS — J690 Pneumonitis due to inhalation of food and vomit: Secondary | ICD-10-CM | POA: Diagnosis not present

## 2020-11-28 DIAGNOSIS — F330 Major depressive disorder, recurrent, mild: Secondary | ICD-10-CM | POA: Diagnosis not present

## 2020-11-28 DIAGNOSIS — G35 Multiple sclerosis: Secondary | ICD-10-CM | POA: Diagnosis not present

## 2020-11-28 DIAGNOSIS — Z993 Dependence on wheelchair: Secondary | ICD-10-CM | POA: Diagnosis not present

## 2020-11-28 DIAGNOSIS — I10 Essential (primary) hypertension: Secondary | ICD-10-CM | POA: Diagnosis not present

## 2020-11-28 DIAGNOSIS — L89152 Pressure ulcer of sacral region, stage 2: Secondary | ICD-10-CM | POA: Diagnosis not present

## 2020-11-28 DIAGNOSIS — G250 Essential tremor: Secondary | ICD-10-CM | POA: Diagnosis not present

## 2020-11-28 DIAGNOSIS — Z9350 Unspecified cystostomy status: Secondary | ICD-10-CM | POA: Diagnosis not present

## 2020-11-28 DIAGNOSIS — R569 Unspecified convulsions: Secondary | ICD-10-CM | POA: Diagnosis not present

## 2020-11-28 DIAGNOSIS — Z4800 Encounter for change or removal of nonsurgical wound dressing: Secondary | ICD-10-CM | POA: Diagnosis not present

## 2020-11-28 DIAGNOSIS — M479 Spondylosis, unspecified: Secondary | ICD-10-CM | POA: Diagnosis not present

## 2020-11-28 DIAGNOSIS — L89612 Pressure ulcer of right heel, stage 2: Secondary | ICD-10-CM | POA: Diagnosis not present

## 2020-11-28 DIAGNOSIS — R251 Tremor, unspecified: Secondary | ICD-10-CM | POA: Diagnosis not present

## 2020-12-01 ENCOUNTER — Other Ambulatory Visit (HOSPITAL_COMMUNITY): Payer: Self-pay

## 2020-12-01 DIAGNOSIS — R1310 Dysphagia, unspecified: Secondary | ICD-10-CM

## 2020-12-05 ENCOUNTER — Other Ambulatory Visit: Payer: Self-pay

## 2020-12-05 MED ORDER — LAMOTRIGINE 25 MG PO TABS: ORAL_TABLET | ORAL | 5 refills | Status: DC

## 2020-12-05 NOTE — Progress Notes (Signed)
Per Dr.Jaffe, Please send a prescription for lamotrigine 50mg  tablet (take 1 tablet twice daily) to patient's pharmacy with 5 additional refills.  Thank you   Medication sent to the [facility]

## 2020-12-08 DIAGNOSIS — R251 Tremor, unspecified: Secondary | ICD-10-CM | POA: Diagnosis not present

## 2020-12-08 DIAGNOSIS — Z4800 Encounter for change or removal of nonsurgical wound dressing: Secondary | ICD-10-CM | POA: Diagnosis not present

## 2020-12-08 DIAGNOSIS — R569 Unspecified convulsions: Secondary | ICD-10-CM | POA: Diagnosis not present

## 2020-12-08 DIAGNOSIS — F330 Major depressive disorder, recurrent, mild: Secondary | ICD-10-CM | POA: Diagnosis not present

## 2020-12-08 DIAGNOSIS — L89152 Pressure ulcer of sacral region, stage 2: Secondary | ICD-10-CM | POA: Diagnosis not present

## 2020-12-08 DIAGNOSIS — L89612 Pressure ulcer of right heel, stage 2: Secondary | ICD-10-CM | POA: Diagnosis not present

## 2020-12-08 DIAGNOSIS — Z9350 Unspecified cystostomy status: Secondary | ICD-10-CM | POA: Diagnosis not present

## 2020-12-08 DIAGNOSIS — M479 Spondylosis, unspecified: Secondary | ICD-10-CM | POA: Diagnosis not present

## 2020-12-08 DIAGNOSIS — I10 Essential (primary) hypertension: Secondary | ICD-10-CM | POA: Diagnosis not present

## 2020-12-08 DIAGNOSIS — Z993 Dependence on wheelchair: Secondary | ICD-10-CM | POA: Diagnosis not present

## 2020-12-08 DIAGNOSIS — J690 Pneumonitis due to inhalation of food and vomit: Secondary | ICD-10-CM | POA: Diagnosis not present

## 2020-12-08 DIAGNOSIS — G250 Essential tremor: Secondary | ICD-10-CM | POA: Diagnosis not present

## 2020-12-08 DIAGNOSIS — G35 Multiple sclerosis: Secondary | ICD-10-CM | POA: Diagnosis not present

## 2020-12-09 DIAGNOSIS — L89612 Pressure ulcer of right heel, stage 2: Secondary | ICD-10-CM | POA: Diagnosis not present

## 2020-12-09 DIAGNOSIS — G35 Multiple sclerosis: Secondary | ICD-10-CM | POA: Diagnosis not present

## 2020-12-09 DIAGNOSIS — Z9350 Unspecified cystostomy status: Secondary | ICD-10-CM | POA: Diagnosis not present

## 2020-12-09 DIAGNOSIS — M479 Spondylosis, unspecified: Secondary | ICD-10-CM | POA: Diagnosis not present

## 2020-12-09 DIAGNOSIS — L89152 Pressure ulcer of sacral region, stage 2: Secondary | ICD-10-CM | POA: Diagnosis not present

## 2020-12-09 DIAGNOSIS — Z4800 Encounter for change or removal of nonsurgical wound dressing: Secondary | ICD-10-CM | POA: Diagnosis not present

## 2020-12-09 DIAGNOSIS — I10 Essential (primary) hypertension: Secondary | ICD-10-CM | POA: Diagnosis not present

## 2020-12-09 DIAGNOSIS — Z993 Dependence on wheelchair: Secondary | ICD-10-CM | POA: Diagnosis not present

## 2020-12-09 DIAGNOSIS — J690 Pneumonitis due to inhalation of food and vomit: Secondary | ICD-10-CM | POA: Diagnosis not present

## 2020-12-09 DIAGNOSIS — G250 Essential tremor: Secondary | ICD-10-CM | POA: Diagnosis not present

## 2020-12-09 DIAGNOSIS — R251 Tremor, unspecified: Secondary | ICD-10-CM | POA: Diagnosis not present

## 2020-12-09 DIAGNOSIS — F330 Major depressive disorder, recurrent, mild: Secondary | ICD-10-CM | POA: Diagnosis not present

## 2020-12-09 DIAGNOSIS — R569 Unspecified convulsions: Secondary | ICD-10-CM | POA: Diagnosis not present

## 2020-12-15 ENCOUNTER — Telehealth: Payer: Self-pay | Admitting: Neurology

## 2020-12-15 ENCOUNTER — Other Ambulatory Visit: Payer: Self-pay | Admitting: Neurology

## 2020-12-15 DIAGNOSIS — G40909 Epilepsy, unspecified, not intractable, without status epilepticus: Secondary | ICD-10-CM | POA: Diagnosis not present

## 2020-12-15 DIAGNOSIS — G250 Essential tremor: Secondary | ICD-10-CM | POA: Diagnosis not present

## 2020-12-15 DIAGNOSIS — H6123 Impacted cerumen, bilateral: Secondary | ICD-10-CM | POA: Diagnosis not present

## 2020-12-15 DIAGNOSIS — Z0000 Encounter for general adult medical examination without abnormal findings: Secondary | ICD-10-CM | POA: Diagnosis not present

## 2020-12-15 DIAGNOSIS — G35 Multiple sclerosis: Secondary | ICD-10-CM | POA: Diagnosis not present

## 2020-12-15 DIAGNOSIS — Z1389 Encounter for screening for other disorder: Secondary | ICD-10-CM | POA: Diagnosis not present

## 2020-12-15 DIAGNOSIS — Z23 Encounter for immunization: Secondary | ICD-10-CM | POA: Diagnosis not present

## 2020-12-15 DIAGNOSIS — I10 Essential (primary) hypertension: Secondary | ICD-10-CM | POA: Diagnosis not present

## 2020-12-15 DIAGNOSIS — Z79899 Other long term (current) drug therapy: Secondary | ICD-10-CM | POA: Diagnosis not present

## 2020-12-15 DIAGNOSIS — R197 Diarrhea, unspecified: Secondary | ICD-10-CM | POA: Diagnosis not present

## 2020-12-15 DIAGNOSIS — S51811A Laceration without foreign body of right forearm, initial encounter: Secondary | ICD-10-CM | POA: Diagnosis not present

## 2020-12-15 DIAGNOSIS — Z9359 Other cystostomy status: Secondary | ICD-10-CM | POA: Diagnosis not present

## 2020-12-15 MED ORDER — LAMOTRIGINE 25 MG PO TABS: ORAL_TABLET | ORAL | 5 refills | Status: DC

## 2020-12-15 NOTE — Telephone Encounter (Signed)
Pt husband called, the pharmacy messed up her RX of Lamictal. They need a new RX 50mg  BID lamictal. Sent to the same pharmacy on file

## 2020-12-15 NOTE — Telephone Encounter (Signed)
Per Dr.Jaffe note 12/05/20, send a prescription for lamotrigine 50mg  tablet (take 1 tablet twice daily) to patient's pharmacy with 5 additional refills.  Thank you    After reviewing there is no 50 mg tab. We will have to send in  a 25 mg tab pt to take two tabs twice a day.   Tried calling pt husband no answer.  Telephone call to [facility] to advise.

## 2020-12-15 NOTE — Telephone Encounter (Signed)
Patient's spouse called and said the wrong dosage was sent in. It should be 50 MG twice a day.  Patient will be out after tomorrow. He is requesting a call back about this.   [facility] on [address] and [address]

## 2020-12-16 ENCOUNTER — Ambulatory Visit (HOSPITAL_COMMUNITY)
Admission: RE | Admit: 2020-12-16 | Discharge: 2020-12-16 | Disposition: A | Discharge status: Home / Self Care | Payer: PPO | Source: Ambulatory Visit | Attending: Geriatric Medicine | Admitting: Geriatric Medicine

## 2020-12-16 ENCOUNTER — Other Ambulatory Visit: Payer: Self-pay

## 2020-12-16 ENCOUNTER — Telehealth: Payer: Self-pay | Admitting: Neurology

## 2020-12-16 DIAGNOSIS — R1310 Dysphagia, unspecified: Secondary | ICD-10-CM | POA: Diagnosis not present

## 2020-12-16 NOTE — Telephone Encounter (Signed)
Spoke to patient husband [name], Lamictal 25 mg #2 BID sent into the pharmacy. Unable to order 50 mg tab.   Please call me if you have any issues pick up the medication.

## 2020-12-16 NOTE — Telephone Encounter (Signed)
Pt husband is returning a call to sheena about wifes medication

## 2020-12-17 DIAGNOSIS — Z4800 Encounter for change or removal of nonsurgical wound dressing: Secondary | ICD-10-CM | POA: Diagnosis not present

## 2020-12-17 DIAGNOSIS — I10 Essential (primary) hypertension: Secondary | ICD-10-CM | POA: Diagnosis not present

## 2020-12-17 DIAGNOSIS — G35 Multiple sclerosis: Secondary | ICD-10-CM | POA: Diagnosis not present

## 2020-12-17 DIAGNOSIS — F330 Major depressive disorder, recurrent, mild: Secondary | ICD-10-CM | POA: Diagnosis not present

## 2020-12-17 DIAGNOSIS — M479 Spondylosis, unspecified: Secondary | ICD-10-CM | POA: Diagnosis not present

## 2020-12-17 DIAGNOSIS — Z993 Dependence on wheelchair: Secondary | ICD-10-CM | POA: Diagnosis not present

## 2020-12-17 DIAGNOSIS — Z9350 Unspecified cystostomy status: Secondary | ICD-10-CM | POA: Diagnosis not present

## 2020-12-17 DIAGNOSIS — R569 Unspecified convulsions: Secondary | ICD-10-CM | POA: Diagnosis not present

## 2020-12-17 DIAGNOSIS — L89152 Pressure ulcer of sacral region, stage 2: Secondary | ICD-10-CM | POA: Diagnosis not present

## 2020-12-17 DIAGNOSIS — G250 Essential tremor: Secondary | ICD-10-CM | POA: Diagnosis not present

## 2020-12-24 ENCOUNTER — Telehealth: Payer: Self-pay | Admitting: Neurology

## 2020-12-24 DIAGNOSIS — F330 Major depressive disorder, recurrent, mild: Secondary | ICD-10-CM | POA: Diagnosis not present

## 2020-12-24 DIAGNOSIS — Z993 Dependence on wheelchair: Secondary | ICD-10-CM | POA: Diagnosis not present

## 2020-12-24 DIAGNOSIS — I10 Essential (primary) hypertension: Secondary | ICD-10-CM | POA: Diagnosis not present

## 2020-12-24 DIAGNOSIS — M479 Spondylosis, unspecified: Secondary | ICD-10-CM | POA: Diagnosis not present

## 2020-12-24 DIAGNOSIS — L89152 Pressure ulcer of sacral region, stage 2: Secondary | ICD-10-CM | POA: Diagnosis not present

## 2020-12-24 DIAGNOSIS — G250 Essential tremor: Secondary | ICD-10-CM | POA: Diagnosis not present

## 2020-12-24 DIAGNOSIS — Z9350 Unspecified cystostomy status: Secondary | ICD-10-CM | POA: Diagnosis not present

## 2020-12-24 DIAGNOSIS — Z4800 Encounter for change or removal of nonsurgical wound dressing: Secondary | ICD-10-CM | POA: Diagnosis not present

## 2020-12-24 DIAGNOSIS — R569 Unspecified convulsions: Secondary | ICD-10-CM | POA: Diagnosis not present

## 2020-12-24 DIAGNOSIS — G35 Multiple sclerosis: Secondary | ICD-10-CM | POA: Diagnosis not present

## 2020-12-24 MED ORDER — LAMOTRIGINE 25 MG PO TABS
ORAL_TABLET | ORAL | 5 refills | Status: DC
Start: 2020-12-24 — End: 2021-03-24

## 2020-12-24 NOTE — Telephone Encounter (Signed)
QTY incorrect on script. QTY changed new script sent.

## 2020-12-24 NOTE — Telephone Encounter (Signed)
Pt husband needs a call from sheena about wifes lamictal

## 2020-12-25 DIAGNOSIS — R569 Unspecified convulsions: Secondary | ICD-10-CM | POA: Diagnosis not present

## 2020-12-25 DIAGNOSIS — I10 Essential (primary) hypertension: Secondary | ICD-10-CM | POA: Diagnosis not present

## 2020-12-25 DIAGNOSIS — L89152 Pressure ulcer of sacral region, stage 2: Secondary | ICD-10-CM | POA: Diagnosis not present

## 2020-12-25 DIAGNOSIS — Z9350 Unspecified cystostomy status: Secondary | ICD-10-CM | POA: Diagnosis not present

## 2020-12-25 DIAGNOSIS — Z993 Dependence on wheelchair: Secondary | ICD-10-CM | POA: Diagnosis not present

## 2020-12-25 DIAGNOSIS — Z4800 Encounter for change or removal of nonsurgical wound dressing: Secondary | ICD-10-CM | POA: Diagnosis not present

## 2020-12-25 DIAGNOSIS — M479 Spondylosis, unspecified: Secondary | ICD-10-CM | POA: Diagnosis not present

## 2020-12-25 DIAGNOSIS — G35 Multiple sclerosis: Secondary | ICD-10-CM | POA: Diagnosis not present

## 2020-12-25 DIAGNOSIS — F330 Major depressive disorder, recurrent, mild: Secondary | ICD-10-CM | POA: Diagnosis not present

## 2020-12-25 DIAGNOSIS — G250 Essential tremor: Secondary | ICD-10-CM | POA: Diagnosis not present

## 2020-12-26 DIAGNOSIS — N319 Neuromuscular dysfunction of bladder, unspecified: Secondary | ICD-10-CM | POA: Diagnosis not present

## 2020-12-26 DIAGNOSIS — G35 Multiple sclerosis: Secondary | ICD-10-CM | POA: Diagnosis not present

## 2020-12-29 DIAGNOSIS — L89152 Pressure ulcer of sacral region, stage 2: Secondary | ICD-10-CM | POA: Diagnosis not present

## 2020-12-29 DIAGNOSIS — Z993 Dependence on wheelchair: Secondary | ICD-10-CM | POA: Diagnosis not present

## 2020-12-29 DIAGNOSIS — Z4800 Encounter for change or removal of nonsurgical wound dressing: Secondary | ICD-10-CM | POA: Diagnosis not present

## 2020-12-29 DIAGNOSIS — F330 Major depressive disorder, recurrent, mild: Secondary | ICD-10-CM | POA: Diagnosis not present

## 2020-12-29 DIAGNOSIS — M479 Spondylosis, unspecified: Secondary | ICD-10-CM | POA: Diagnosis not present

## 2020-12-29 DIAGNOSIS — G35 Multiple sclerosis: Secondary | ICD-10-CM | POA: Diagnosis not present

## 2020-12-29 DIAGNOSIS — I10 Essential (primary) hypertension: Secondary | ICD-10-CM | POA: Diagnosis not present

## 2020-12-29 DIAGNOSIS — Z9350 Unspecified cystostomy status: Secondary | ICD-10-CM | POA: Diagnosis not present

## 2020-12-29 DIAGNOSIS — R569 Unspecified convulsions: Secondary | ICD-10-CM | POA: Diagnosis not present

## 2020-12-29 DIAGNOSIS — G250 Essential tremor: Secondary | ICD-10-CM | POA: Diagnosis not present

## 2021-01-02 DIAGNOSIS — Z993 Dependence on wheelchair: Secondary | ICD-10-CM | POA: Diagnosis not present

## 2021-01-02 DIAGNOSIS — G35 Multiple sclerosis: Secondary | ICD-10-CM | POA: Diagnosis not present

## 2021-01-02 DIAGNOSIS — M479 Spondylosis, unspecified: Secondary | ICD-10-CM | POA: Diagnosis not present

## 2021-01-02 DIAGNOSIS — I10 Essential (primary) hypertension: Secondary | ICD-10-CM | POA: Diagnosis not present

## 2021-01-02 DIAGNOSIS — Z9350 Unspecified cystostomy status: Secondary | ICD-10-CM | POA: Diagnosis not present

## 2021-01-02 DIAGNOSIS — L89152 Pressure ulcer of sacral region, stage 2: Secondary | ICD-10-CM | POA: Diagnosis not present

## 2021-01-02 DIAGNOSIS — R569 Unspecified convulsions: Secondary | ICD-10-CM | POA: Diagnosis not present

## 2021-01-02 DIAGNOSIS — G250 Essential tremor: Secondary | ICD-10-CM | POA: Diagnosis not present

## 2021-01-02 DIAGNOSIS — F330 Major depressive disorder, recurrent, mild: Secondary | ICD-10-CM | POA: Diagnosis not present

## 2021-01-02 DIAGNOSIS — Z4800 Encounter for change or removal of nonsurgical wound dressing: Secondary | ICD-10-CM | POA: Diagnosis not present

## 2021-01-07 DIAGNOSIS — G35 Multiple sclerosis: Secondary | ICD-10-CM | POA: Diagnosis not present

## 2021-01-07 DIAGNOSIS — F330 Major depressive disorder, recurrent, mild: Secondary | ICD-10-CM | POA: Diagnosis not present

## 2021-01-07 DIAGNOSIS — L89152 Pressure ulcer of sacral region, stage 2: Secondary | ICD-10-CM | POA: Diagnosis not present

## 2021-01-07 DIAGNOSIS — Z993 Dependence on wheelchair: Secondary | ICD-10-CM | POA: Diagnosis not present

## 2021-01-07 DIAGNOSIS — M479 Spondylosis, unspecified: Secondary | ICD-10-CM | POA: Diagnosis not present

## 2021-01-07 DIAGNOSIS — G250 Essential tremor: Secondary | ICD-10-CM | POA: Diagnosis not present

## 2021-01-07 DIAGNOSIS — Z9350 Unspecified cystostomy status: Secondary | ICD-10-CM | POA: Diagnosis not present

## 2021-01-07 DIAGNOSIS — R569 Unspecified convulsions: Secondary | ICD-10-CM | POA: Diagnosis not present

## 2021-01-07 DIAGNOSIS — Z4800 Encounter for change or removal of nonsurgical wound dressing: Secondary | ICD-10-CM | POA: Diagnosis not present

## 2021-01-07 DIAGNOSIS — I10 Essential (primary) hypertension: Secondary | ICD-10-CM | POA: Diagnosis not present

## 2021-01-08 DIAGNOSIS — Z4800 Encounter for change or removal of nonsurgical wound dressing: Secondary | ICD-10-CM | POA: Diagnosis not present

## 2021-01-08 DIAGNOSIS — I10 Essential (primary) hypertension: Secondary | ICD-10-CM | POA: Diagnosis not present

## 2021-01-08 DIAGNOSIS — Z9350 Unspecified cystostomy status: Secondary | ICD-10-CM | POA: Diagnosis not present

## 2021-01-08 DIAGNOSIS — G250 Essential tremor: Secondary | ICD-10-CM | POA: Diagnosis not present

## 2021-01-08 DIAGNOSIS — R569 Unspecified convulsions: Secondary | ICD-10-CM | POA: Diagnosis not present

## 2021-01-08 DIAGNOSIS — Z993 Dependence on wheelchair: Secondary | ICD-10-CM | POA: Diagnosis not present

## 2021-01-08 DIAGNOSIS — M479 Spondylosis, unspecified: Secondary | ICD-10-CM | POA: Diagnosis not present

## 2021-01-08 DIAGNOSIS — L89152 Pressure ulcer of sacral region, stage 2: Secondary | ICD-10-CM | POA: Diagnosis not present

## 2021-01-08 DIAGNOSIS — G35 Multiple sclerosis: Secondary | ICD-10-CM | POA: Diagnosis not present

## 2021-01-08 DIAGNOSIS — F330 Major depressive disorder, recurrent, mild: Secondary | ICD-10-CM | POA: Diagnosis not present

## 2021-01-15 ENCOUNTER — Ambulatory Visit: Payer: PPO | Admitting: Neurology

## 2021-01-16 ENCOUNTER — Encounter: Payer: Self-pay | Admitting: Neurology

## 2021-01-16 ENCOUNTER — Other Ambulatory Visit: Payer: Self-pay

## 2021-01-16 ENCOUNTER — Telehealth (INDEPENDENT_AMBULATORY_CARE_PROVIDER_SITE_OTHER): Payer: PPO | Admitting: Neurology

## 2021-01-16 DIAGNOSIS — G35 Multiple sclerosis: Secondary | ICD-10-CM

## 2021-01-16 DIAGNOSIS — G252 Other specified forms of tremor: Secondary | ICD-10-CM | POA: Diagnosis not present

## 2021-01-16 DIAGNOSIS — R569 Unspecified convulsions: Secondary | ICD-10-CM

## 2021-01-16 DIAGNOSIS — R419 Unspecified symptoms and signs involving cognitive functions and awareness: Secondary | ICD-10-CM | POA: Diagnosis not present

## 2021-01-16 NOTE — Progress Notes (Signed)
Due to the COVID-19 crisis, this telephone visit was done via telephone from my office and it was initiated and consent given by this patient and or family.  Patient's spouse was unable to connect via video virtual visit.    Telephone (Audio) Visit The purpose of this telephone visit is to provide medical care while limiting exposure to the novel coronavirus.    Consent was obtained for telephone visit and initiated by pt/family:  Yes.   Answered questions that patient had about telehealth interaction:  Yes.   I discussed the limitations, risks, security and privacy concerns of performing an evaluation and management service by telephone. I also discussed with the patient that there may be a patient responsible charge related to this service. The patient expressed understanding and agreed to proceed.  Pt location: Home Physician Location: office Name of referring provider:  Merlene Laughter, MD I connected with .Kelly Cole at patients initiation/request on 01/16/2021 at  3:30 PM EST by telephone and verified that I am speaking with the correct person using two identifiers.  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  Follow up 6 months.  Need for in person visit now:  No.  Subjective:  Kelly Cole is a 80 year old white female with multiple sclerosis, depression and chronic low back pain who follows up for new-onset seizure.  History obtained by her husband and  reviewing hospital records.   UPDATE: Current medications:  lamotrigine 50mg  twice daily. Lorazepam 0.5mg -1mg  twice daily PRN, Buspirone 15mg  three times daily, quetiapine 75mg  at bedtime, temazepam 30mg  at bedtime, venlafaxine 100mg  three times daily.  Overall, memory has improved.  Low stamina.  Still sleepy sometimes during the day but not all of the time.  Less confusion and agitation.  She has not needed a lorazepam in a month.  Swallowing has improved.  Repeat MBS showed much improvement, only mild oropharyngeal dysphagia.  She eats normal diet but with precautions.     HISTORY: As a teenager, she was hospitalized for bilateral leg weakness.  They thought Polio but never had a diagnosis.  Symptoms resolved.  She was diagnosed with MS in 1970 while in after experiencing numbness and tingling in her fingers.  She may have received visual evoked potentials but was primarily diagnosed based on clinical findings.  Over the next decade, she gradually had decline in gait, developing a foot drop which caused her to trip.  In the mid-late 70s, she participated in a trial where she was treated with chlorambucil and drop foot resolved.  High doses of vitamin injections (C, B1, B2, B12, etc)   Over the last 10-15 years, she has had a gradual decline in ambulation.  She progressed from cane to walker about 10 years ago.  She has been in a wheelchair for the past 6 years, following a hemicolectomy.  MRI of brain from 2019 showed chronic demyelination.   She was admitted to [facility] on 09/20/2020 for seizure-like activity.  She was found by her  husband unresponsive with roving eye movements rolled back and restless non-purposeful movements of all extremities.  Lasted 45-60 seconds.  Afterwards, she was lethargic and then exhibited teeth grinding with flailing of her arms.  EMS was called and she was brought to the ED.  Neurologic exam demonstrated bilateral lower extremity contractures without  antigravity movement and brief coarsse tremor of the left upper extremity.  DTRs were 3+ throughout.  CT head personally reviewed showed left basal ganglia lacunar infarct but no acute intracranial abnormality.  EEG showed diffuse encephalopathy but no epileptiform discharges or electrographic seizures.  She was started on antibiotics for suspected aspiration pneumonia.  Urine culture was negative.  K and Mg were 3.3 and 1.4 respectively.  Na fell between 127-131.  She takes temazepam at home, which was switched to Ativan during hospitalization in order to prevent withdrawal.  He was discharged on Depakote sprinkles 500mg  every 12 hours.  Wellbutrin was discontinued.    Past disease modifying therapy:  Betaseron (for 1 year, stopped due to side effects and ineffective) Other past medications:  Depakote (for seizure - lethargy and diarrhea)   Objective:   There were no vitals filed for this visit.   Follow Up Instructions:      -I discussed the assessment and treatment plan with the patient. The patient was provided an opportunity to ask questions and all were answered. The patient agreed with the plan and demonstrated an understanding of the instructions.   The patient was advised to call back or seek an in-person evaluation if the symptoms worsen or if the condition fails to improve as anticipated.    Total Time spent in visit with the patient was:  27 minutes  , DO

## 2021-01-20 DIAGNOSIS — N319 Neuromuscular dysfunction of bladder, unspecified: Secondary | ICD-10-CM | POA: Diagnosis not present

## 2021-01-25 ENCOUNTER — Other Ambulatory Visit: Payer: Self-pay | Admitting: Neurology

## 2021-02-09 ENCOUNTER — Encounter: Payer: Self-pay | Admitting: Neurology

## 2021-02-11 NOTE — Progress Notes (Deleted)
Due to the COVID-19 crisis, this telephone visit was done via telephone from my office and it was initiated and consent given by this patient and or family.  Telephone (Audio) Visit The purpose of this telephone visit is to provide medical care while limiting exposure to the novel coronavirus.  Attempted video virtual visit but was unable to connect.  Consent was obtained for telephone visit and initiated by pt/family:  Yes.   Answered questions that patient had about telehealth interaction:  Yes.   I discussed the limitations, risks, security and privacy concerns of performing an evaluation and management service by telephone. I also discussed with the patient that there may be a patient responsible charge related to this service. The patient expressed understanding and agreed to proceed.  Pt location: Home Physician Location: office Name of referring provider:  Merlene Laughter, MD I connected with .Kelly Cole and her husband at their initiation/request on 02/12/2021 at 10:50 AM EST by telephone and verified that I am speaking with the correct person using two identifiers.  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  Follow up 6 months.  Need for in person visit now:  No.  Subjective:  Cherylann Hobday is a 81 year old white female with multiple sclerosis, depression and chronic low back pain who follows up for new-onset seizure.  History obtained by her husband and  reviewing hospital records.   UPDATE: Current medications:  lamotrigine 50mg  twice daily. Lorazepam 0.5mg -1mg  twice daily PRN, Buspirone 15mg  three times daily, temazepam 30mg  at bedtime, venlafaxine 100mg  three times daily.   Tapered off of quetiapine last month to reduce polypharmacy.  ***   HISTORY: As a teenager, she was hospitalized for bilateral leg weakness.  They thought Polio but never had a diagnosis.  Symptoms resolved.  She was diagnosed with MS in 1970 while in after experiencing numbness and tingling in her fingers.  She may have received visual evoked potentials but was primarily diagnosed based on clinical findings.  Over the next decade, she gradually had decline in gait, developing a foot drop which caused her to trip.  In the mid-late 70s, she participated in a trial where she was treated with chlorambucil and drop foot resolved.  High doses of vitamin injections (C, B1, B2, B12, etc)   Over the last 10-15 years, she has had a gradual decline in ambulation.  She progressed from cane to walker about 10 years ago.  She has been in a wheelchair for the past 6 years, following a hemicolectomy.  MRI of brain from 2019 showed chronic demyelination.   She was admitted to [facility] on 09/20/2020 for seizure-like activity.  She was found by her husband unresponsive with roving eye movements rolled back and restless non-purposeful movements of all extremities.  Lasted 45-60 seconds.  Afterwards, she was lethargic and then exhibited teeth grinding with flailing of her arms.  EMS was called and she was brought to the ED.  Neurologic exam demonstrated bilateral lower extremity contractures without  antigravity movement and brief coarsse tremor of the left upper extremity.  DTRs were 3+ throughout.  CT head personally reviewed showed left basal ganglia lacunar infarct but no acute intracranial abnormality.  EEG showed diffuse encephalopathy but no epileptiform discharges or  electrographic seizures.  She was started on antibiotics for suspected aspiration pneumonia.  Urine culture was negative.  K and Mg were 3.3 and 1.4 respectively.  Na fell between 127-131.  She takes temazepam at home, which was switched to Ativan during hospitalization in order to prevent withdrawal.  He was discharged on Depakote sprinkles 500mg  every 12 hours.  Wellbutrin was discontinued.    Past disease modifying therapy:  Betaseron (for 1 year, stopped due to side effects and ineffective) Other past medications:  Depakote (for seizure - lethargy and diarrhea)   Objective:   There were no vitals filed for this visit.   Follow Up Instructions:      -I discussed the assessment and treatment plan with the patient. The patient was provided an opportunity to ask questions and all were answered. The patient agreed with the plan and demonstrated an understanding of the instructions.   The patient was advised to call back or seek an in-person evaluation if the symptoms worsen or if the condition fails to improve as anticipated.    Total Time spent in visit with the patient was:  ***   , DO

## 2021-02-12 ENCOUNTER — Encounter: Payer: Self-pay | Admitting: Neurology

## 2021-02-12 ENCOUNTER — Telehealth: Payer: Self-pay | Admitting: Neurology

## 2021-02-12 ENCOUNTER — Telehealth: Payer: PPO | Admitting: Neurology

## 2021-02-12 NOTE — Telephone Encounter (Signed)
We rescheduled patient for 02/24/21. Patients husband stated Kelly Cole really needs to be seen sooner, or put on something for her sleep. She has not slept and she is miserable. If he cant see her before 1/17, can she be put on Seroquel or something else

## 2021-02-12 NOTE — Telephone Encounter (Signed)
In December pt was tapered off of Seroquel pt husband is thinking that she may need to start back on something else to help her sleep, he would like to wait for Dr Everlena Cooper to return so that he can see the mychart message that was sent this morning as well. He stated that another MD would not know Dr Everlena Cooper plan of care, and that melatonin would not work.

## 2021-02-16 ENCOUNTER — Telehealth: Payer: Self-pay | Admitting: Neurology

## 2021-02-16 NOTE — Telephone Encounter (Signed)
Patients husband is returning a call to someone. He couldn't understand who it was from.

## 2021-02-16 NOTE — Telephone Encounter (Signed)
See other phone note

## 2021-02-16 NOTE — Telephone Encounter (Signed)
Spoke with pt husband restart the quetiapine but keep it at 25mg  at bedtime and reassess at follow up next week.they did not need a new script they had medication on hand to restart

## 2021-02-16 NOTE — Telephone Encounter (Signed)
Pt husband called back no answer when he calls back send to clinical staff and we willl let him know that Kelly Cole can restart the quetiapine but keep it at 25mg  at bedtime and reassess at follow up next week.

## 2021-02-20 ENCOUNTER — Encounter: Payer: Self-pay | Admitting: Neurology

## 2021-02-23 NOTE — Progress Notes (Signed)
Due to the COVID-19 crisis, this telephone visit was done via telephone from my office and it was initiated and consent given by this patient and or family.  Telephone (Audio) Visit The purpose of this telephone visit is to provide medical care while limiting exposure to the novel coronavirus.  Attempted video virtual visit but was unable to connect.  Consent was obtained for telephone visit and initiated by pt/family:  Yes.   Answered questions that patient had about telehealth interaction:  Yes.   I discussed the limitations, risks, security and privacy concerns of performing an evaluation and management service by telephone. I also discussed with the patient that there may be a patient responsible charge related to this service. The patient expressed understanding and agreed to proceed.  Pt location: Home Physician Location: office Name of referring provider:  [name], MD I connected with .RYEN Cole and her husband at their initiation/request on 02/24/2021 at 10:10 AM EST by telephone and verified that I am speaking with the correct person using two identifiers.  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  Mr. Decoux will reach out to me in a couple of weeks with an update. Continue lamotrigine 50mg  twice daily Lorazepam 0.5 to 1mg  twice daily as needed.  Follow up in June as scheduled.  Need for in person visit now:  No.  Subjective:  Kelly Cole is a 81 year old white female with multiple sclerosis, depression and chronic low back pain who follows up for new-onset seizure.  History obtained by her husband and reviewing hospital  records.   UPDATE: Current medications:  lamotrigine 50mg  twice daily. Quetiapine 50mg  at bedtime, Lorazepam 0.5mg -1mg  twice daily PRN, Buspirone 15mg  three times daily, temazepam 30mg  at bedtime, venlafaxine 100mg  three times daily.   Tapered off of quetiapine last month to reduce polypharmacy.  However, she started having trouble sleeping.  A couple of weeks ago, we restarted quetiapine at 25mg  at bedtime.  This was ineffective so we increased last week to 50mg  at bedtime.  So far, it seems to be helping but would like to give it a little more time.  She has been sleeping during the day due to unrestful sleep at night.   HISTORY: As a teenager, she was hospitalized for bilateral leg weakness.  They thought Polio but never had a diagnosis.  Symptoms resolved.  She was diagnosed with MS in 1970 while in [place] after experiencing numbness and tingling in her fingers.  She may have received visual evoked potentials but was primarily diagnosed based on clinical findings.  Over the next decade, she gradually had decline in gait, developing a foot drop which caused her to trip.  In the mid-late 70s, she participated in a trial where she was treated with chlorambucil and drop foot resolved.  High doses of vitamin injections (C, B1, B2, B12, etc)   Over the last 10-15 years, she has had a gradual decline in ambulation.  She progressed from cane to walker about 10 years ago.  She has been in a wheelchair for the past 6 years, following a hemicolectomy.  MRI of brain from 2019 showed chronic demyelination.   She was admitted to [facility] on 09/20/2020 for seizure-like activity.  She was found by her husband unresponsive with roving eye movements rolled back and restless non-purposeful movements of all extremities.  Lasted 45-60 seconds.  Afterwards, she was lethargic and then exhibited teeth grinding with flailing of her arms.  EMS was called and she was brought to the ED.  Neurologic exam demonstrated  bilateral lower extremity contractures without antigravity movement and brief coarsse tremor of the left upper extremity.  DTRs were 3+ throughout.  CT head personally reviewed showed left basal ganglia lacunar infarct but no acute intracranial abnormality.  EEG showed diffuse encephalopathy but no epileptiform discharges or electrographic seizures.  She was started on antibiotics for suspected aspiration pneumonia.  Urine culture was negative.  K and Mg were 3.3 and 1.4 respectively.  Na fell between 127-131.  She takes temazepam at home, which was switched to Ativan during hospitalization in order to prevent withdrawal.  He was discharged on Depakote sprinkles 500mg  every 12 hours.  Wellbutrin was discontinued.    Past disease modifying therapy:  Betaseron (for 1 year, stopped due to side effects and ineffective) Other past medications:  Depakote (for seizure - lethargy and diarrhea), levetiracetam (prescribed as a bridge during transition from Depakote to lamotrigine)   Objective:   There were no vitals filed for this visit.   Follow Up Instructions:      -I discussed the assessment and treatment plan with the patient. The patient was provided an opportunity to ask questions and all were answered. The patient agreed with the plan and demonstrated an understanding of the instructions.   The patient was advised to call back or seek an in-person evaluation if the symptoms worsen or if the condition fails to improve as anticipated.    Total Time spent in visit with the patient was:  12 minutes   Dudley Major, DO

## 2021-02-24 ENCOUNTER — Other Ambulatory Visit: Payer: Self-pay

## 2021-02-24 ENCOUNTER — Telehealth (INDEPENDENT_AMBULATORY_CARE_PROVIDER_SITE_OTHER): Payer: PPO | Admitting: Neurology

## 2021-02-24 ENCOUNTER — Encounter: Payer: Self-pay | Admitting: Neurology

## 2021-02-24 DIAGNOSIS — G35 Multiple sclerosis: Secondary | ICD-10-CM

## 2021-02-24 DIAGNOSIS — R419 Unspecified symptoms and signs involving cognitive functions and awareness: Secondary | ICD-10-CM

## 2021-02-24 DIAGNOSIS — R569 Unspecified convulsions: Secondary | ICD-10-CM

## 2021-02-25 ENCOUNTER — Ambulatory Visit: Payer: PPO | Admitting: Neurology

## 2021-03-06 ENCOUNTER — Other Ambulatory Visit: Payer: PPO

## 2021-03-06 DIAGNOSIS — Z515 Encounter for palliative care: Secondary | ICD-10-CM

## 2021-03-10 ENCOUNTER — Other Ambulatory Visit: Payer: Self-pay

## 2021-03-10 ENCOUNTER — Encounter: Payer: Self-pay | Admitting: Neurology

## 2021-03-10 NOTE — Progress Notes (Signed)
PATIENT NAME: Kelly Cole DOB: 1940-07-19 MRN: [id number]  PRIMARY CARE PROVIDER: Merlene Laughter, MD  RESPONSIBLE PARTY:  Acct ID - Guarantor Home Phone Work Phone Relationship Acct Type  [id number] Kelly Cole* [phone]  Self P/F     [address], [place], [place] [address]    COMMUNITY PALLIATIVE CARE RN NOTE    PLAN OF CARE and INTERVENTION:  ADVANCE CARE PLANNING/GOALS OF CARE: Safety, to remain in her home, comfort PATIENT/CAREGIVER EDUCATION: Palliative care. DISEASE STATUS:  RN Palliative care telephonic encounter completed today with patient's husband, Kelly Cole. Kelly Cole shared that patient has been receiving home health PT and has shown a lot of improvement. Patient can talk on phone with her family and her cognitive status has improved. She will have some difficulty maneuvering things such as the remote control at times. No evidence of pain or shortness of breath. Patient's wounds have now resolved. Kelly Cole declined need for a visit at this time. Plan is to follow up with telephonic encounter in approximately 2 months. Provided education to Kelly Cole, to call before if concerns arise. Kelly Cole expressed understanding.  HISTORY OF PRESENT ILLNESS: This is an 81 year old female with a diagnosis of MS. She has a history of essential tremor, hypertension, seizures, degenerative disc disease and scoliosis. Palliative care has been asked to follow for additional support, care and complex decision making.   CODE STATUS: DNR PPS: 30%   PHYSICAL EXAM: deferred    Duration:       Kelly Beams RN, BSN  Kelly Pandy, RN

## 2021-03-10 NOTE — Progress Notes (Signed)
COMMUNITY PALLIATIVE CARE RN NOTE    PLAN OF CARE and INTERVENTION:  ADVANCE CARE PLANNING/GOALS OF CARE: Safety, to remain in her home, comfort PATIENT/CAREGIVER EDUCATION: Palliative care. DISEASE STATUS:  RN Palliative care telephonic encounter completed today with patient's husband, Kelly Cole. Kelly Cole shared that patient has been receiving home health PT and has shown a lot of improvement. Patient can talk on phone with her family and her cognitive status has improved. She will have some difficulty maneuvering things such as the remote control at times. No evidence of pain or shortness of breath. Patient's wounds have now resolved. Kelly Cole declined need for a visit at this time. Plan is to follow up with telephonic encounter in approximately 2 months. Provided education to [name], to call before if concerns arise. Kelly Cole expressed understanding.  HISTORY OF PRESENT ILLNESS: This is an 81 year old female with a diagnosis of MS. She has a history of essential tremor, hypertension, seizures, degenerative disc disease and scoliosis. Palliative care has been asked to follow for additional support, care and complex decision making.   CODE STATUS: DNR PPS: 30%   PHYSICAL EXAM: deferred    Duration:       Kelly Cole, BSN

## 2021-03-22 ENCOUNTER — Other Ambulatory Visit: Payer: Self-pay | Admitting: Neurology

## 2021-03-24 ENCOUNTER — Other Ambulatory Visit: Payer: Self-pay

## 2021-03-24 MED ORDER — LAMOTRIGINE 25 MG PO TABS: ORAL_TABLET | ORAL | 5 refills | Status: DC

## 2021-04-28 ENCOUNTER — Ambulatory Visit: Payer: PPO | Admitting: Neurology

## 2021-06-17 ENCOUNTER — Other Ambulatory Visit: Payer: PPO

## 2021-06-17 DIAGNOSIS — Z515 Encounter for palliative care: Secondary | ICD-10-CM

## 2021-06-17 NOTE — Progress Notes (Signed)
COMMUNITY PALLIATIVE CARE SW NOTE ? ?PATIENT NAME: Kelly Cole ?DOB: [birth year] ?MRN: [id number] ? ?PRIMARY CARE PROVIDER: Merlene Laughter, MD ? ?RESPONSIBLE PARTY:  ?Acct ID - Guarantor Home Phone Work Phone Relationship Acct Type  ?[id number] ARLA, BOUTWELL* [phone]  Self P/F  ?   [address] [place], [place] [address]  ? ?SOCIAL WORK TELEPHONIC ENCOUNTER ? ?PC SW completed a telephonic encounter with patient's husband-[name]. He reported that patient is having a home health through [facility] treating her pressure ulcer 1x week. The ulcer got better. She has completed speech and little improvement is noted. Patient has a paid caregiver through [facility] daily from 12 pm- 8 pm. Patient now have to transferred with a hoyer lift. [facility] also has a  nurse that changes patient's cathter monthly. [name] verbalized no other needs. He remains open to ongoing palliative care/SW visits and support.  ?Team to follow-up in 6-8 weeks.  ? ?Clydia Llano, LCSW ? ?

## 2021-07-12 NOTE — Progress Notes (Deleted)
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  Kelly Cole will reach out to me in a couple of weeks with an update. Continue lamotrigine 50mg  twice daily Lorazepam 0.5 to 1mg  twice daily as needed.  Follow up in June as scheduled.   Need for in person visit now:  No.   Subjective:  Kelly Cole is a 81 year old white female with multiple sclerosis, depression and chronic low back pain who follows up for new-onset seizure.  History obtained by her husband and reviewing hospital records.   UPDATE: Current medications:  lamotrigine 50mg  twice daily. Quetiapine 50mg  at bedtime, Lorazepam 0.5mg -1mg  twice daily PRN, Buspirone 15mg  three times daily, temazepam 30mg  at bedtime, venlafaxine 100mg  three times daily.   ***  HISTORY: As a teenager, she was hospitalized for bilateral leg weakness.  They thought Polio but never had a diagnosis.  Symptoms resolved.  She was diagnosed with MS in 1970 [place] after experiencing numbness and tingling in her fingers.  She may have received visual evoked potentials but was primarily diagnosed based on clinical findings.  Over the next decade, she gradually had decline in gait, developing a foot drop which caused her to trip.  In the mid-late 70s, she participated in a trial where she was treated with chlorambucil and drop foot resolved.  High doses of vitamin injections (C, B1, B2, B12, etc)   Over the last 10-15 years, she has had a gradual decline in ambulation.  She progressed from cane to walker about 10 years ago.  She has been in a wheelchair for the past 6 years, following a hemicolectomy.   MRI of brain from 2019 showed chronic demyelination.   She was admitted to [facility] on 09/20/2020 for seizure-like activity.  She was found by her husband unresponsive with roving eye movements rolled back and restless non-purposeful movements of all extremities.  Lasted 45-60 seconds.  Afterwards, she was lethargic and then exhibited teeth grinding with flailing of her arms.  EMS was called and she was brought to the ED.  Neurologic exam demonstrated bilateral lower extremity contractures without antigravity movement and brief coarsse tremor of the left upper extremity.  DTRs were 3+ throughout.  CT head personally reviewed showed left basal ganglia lacunar infarct but no acute intracranial abnormality.  EEG showed diffuse encephalopathy but no epileptiform discharges or electrographic seizures.  She was started on antibiotics for suspected aspiration pneumonia.  Urine culture was negative.  K and Mg were 3.3 and 1.4 respectively.  Na fell between 127-131.  She takes temazepam at home, which was switched to Ativan during hospitalization in order to prevent withdrawal.  He was discharged on Depakote sprinkles 500mg  every 12 hours.  Wellbutrin was discontinued.    Past disease modifying therapy:  Betaseron (for 1 year, stopped due to side effects and ineffective) Other past medications:  Depakote (for seizure - lethargy and diarrhea), levetiracetam (prescribed as a bridge during transition from Depakote to lamotrigine)    PAST MEDICAL HISTORY: Past Medical History:  Diagnosis Date   HTN (hypertension)    Multiple sclerosis (HCC)     MEDICATIONS: Current Outpatient Medications on File Prior to Visit  Medication  Sig Dispense Refill   acetaminophen (TYLENOL) 325 MG tablet Take 2 tablets (650 mg total) by mouth every 6 (six) hours as needed for mild pain (or Fever >/= 101). 240 tablet 0   amLODipine (NORVASC) 5 MG tablet Take 1 tablet (5 mg total) by mouth at bedtime. 30 tablet 0   busPIRone  (BUSPAR) 15 MG tablet TAKE 1 TABLET(15 MG) BY MOUTH THREE TIMES DAILY 90 tablet 4   cholecalciferol (VITAMIN D3) 25 MCG (1000 UNIT) tablet Take 2,000 Units by mouth 3 (three) times daily.     cholestyramine (QUESTRAN) 4 g packet Take 4 g by mouth 2 (two) times daily before lunch and supper.     divalproex (DEPAKOTE SPRINKLE) 125 MG capsule Take 4 capsules (500 mg total) by mouth every 12 (twelve) hours. (Patient not taking: Reported on 01/16/2021) 240 capsule 0   lamoTRIgine (LAMICTAL) 25 MG tablet TAKE 2 TABLET BY MOUTH TWICE DAILY 120 tablet 5   levETIRAcetam (KEPPRA) 500 MG tablet Take 1 tablet (500 mg total) by mouth 2 (two) times daily. (Patient not taking: Reported on 01/16/2021) 90 tablet 0   loperamide (IMODIUM) 2 MG capsule Take 1 capsule (2 mg total) by mouth 3 (three) times daily as needed for diarrhea or loose stools. 30 capsule 0   LORazepam (ATIVAN) 0.5 MG tablet Take 1 to 2 tablets twice daily as needed for agitation 60 tablet 3   pantoprazole sodium (PROTONIX) 40 mg PACK Take 20 mLs (40 mg total) by mouth daily. 30 packet 0   polyethylene glycol powder (GLYCOLAX/MIRALAX) 17 GM/SCOOP powder Dissolve 1 capful (17 g) in water and drink daily as needed for mild constipation. 510 g 0   propranolol (INDERAL) 40 MG tablet Take 1 tablet (40 mg total) by mouth 2 (two) times daily. 60 tablet 0   QUEtiapine (SEROQUEL) 25 MG tablet Take 3 tablets (75 mg total) by mouth at bedtime. 90 tablet 0   temazepam (RESTORIL) 15 MG capsule Take 30 mg by mouth at bedtime.     venlafaxine (EFFEXOR) 100 MG tablet Take 1 tablet (100 mg total) by mouth 3 (three) times daily with meals. 90 tablet 0   No current facility-administered medications on file prior to visit.    ALLERGIES: Allergies  Allergen Reactions   Demerol  [Meperidine] Other (See Comments)    FAMILY HISTORY: No family history on file.    Objective:  *** General: No acute distress.  Patient appears well-groomed.   Head:   Normocephalic/atraumatic Eyes:  Fundi examined but not visualized Neck: supple, no paraspinal tenderness, full range of motion Heart:  Regular rate and rhythm Lungs:  Clear to auscultation bilaterally Back: No paraspinal tenderness Neurological Exam: alert and oriented to person, place, and time.  Speech fluent and not dysarthric, language intact.    CN II-XII intact.  Decreased bulk and increased tone.  Motor strength 4/5 upper extremities, 2/5 lower extremities.  Tremor of head.  Finger to nose with bilateral ataxic tremor.  DTR 2+ throughout.  Wheelchair-bound.   Shon Millet, DO  CC: Merlene Laughter, MD

## 2021-07-13 ENCOUNTER — Ambulatory Visit: Payer: PPO | Admitting: Neurology

## 2021-07-22 ENCOUNTER — Other Ambulatory Visit: Payer: Self-pay | Admitting: Neurology

## 2021-08-17 ENCOUNTER — Other Ambulatory Visit: Payer: Self-pay | Admitting: Neurology

## 2021-09-10 IMAGING — DX DG CHEST 1V PORT
1 series · 1 of 1 positions shown · non-contrast
Comparison: None.

CLINICAL DATA: Weakness

EXAM:
PORTABLE CHEST 1 VIEW

[chest ap]
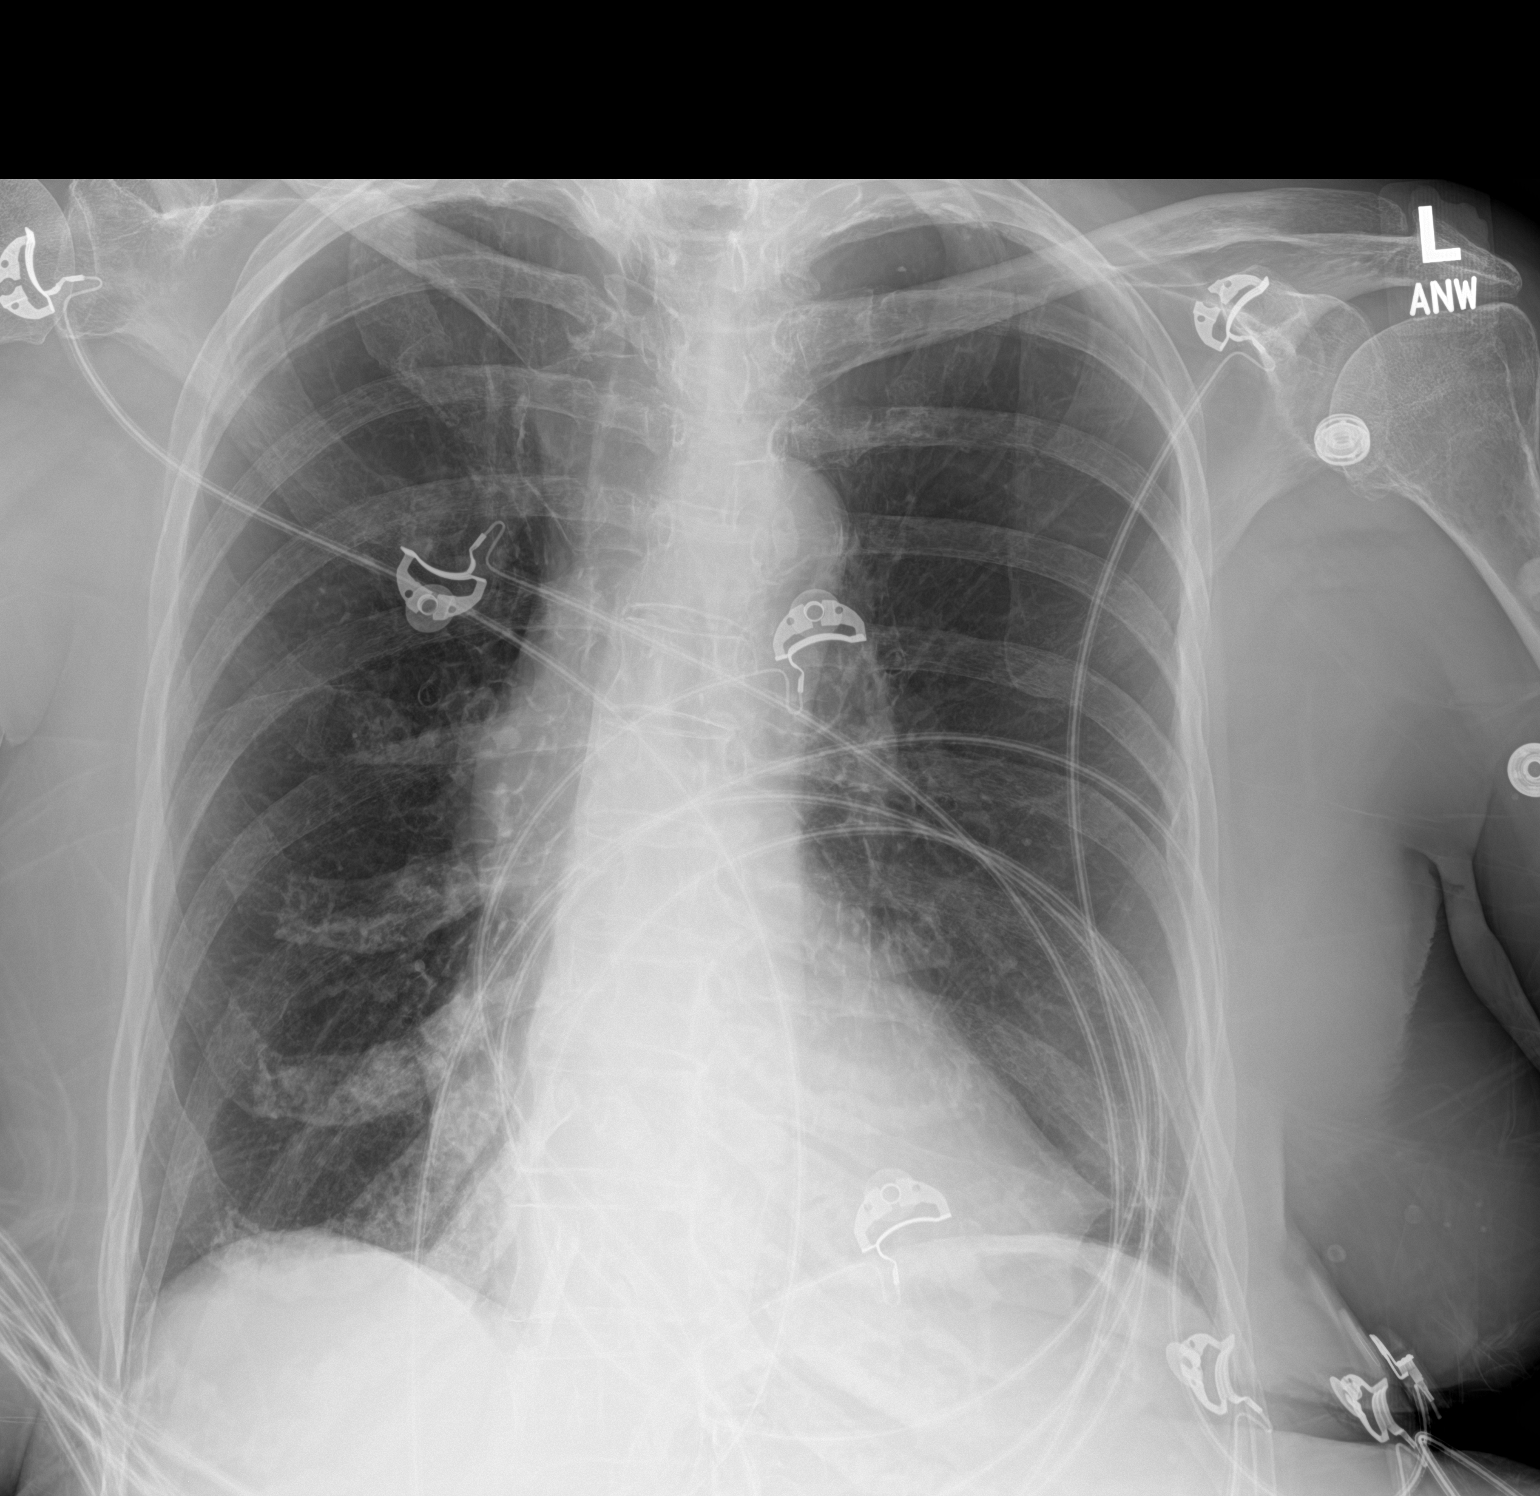

[1 of 1 positions shown; findings below may reference images not displayed]

FINDINGS: There is hyperinflation of the lungs compatible with COPD. Biapical
scarring. Lungs otherwise clear. No effusions. Heart is normal size.
No acute bony abnormality.
IMPRESSION: COPD/chronic changes.  No active disease.

## 2022-01-18 ENCOUNTER — Other Ambulatory Visit: Payer: Self-pay | Admitting: Neurology

## 2022-01-28 ENCOUNTER — Other Ambulatory Visit: Payer: Self-pay | Admitting: Neurology

## 2022-06-03 ENCOUNTER — Other Ambulatory Visit: Payer: Self-pay | Admitting: Neurology

## 2022-06-03 NOTE — Telephone Encounter (Signed)
Telephone call to patient, Patient sleep Husband not on DPR. Advised husband we can call back later to schedule a visit so she can get further refills when she is awake.   Per Husband wife is bed bound now. She is unable to get out of bed and her at home agency declines to to transport now.   Please advise if patient can do a VV. And we can mail her husband the proper forms to be able to speak and schedule visit with him.

## 2022-07-28 ENCOUNTER — Other Ambulatory Visit: Payer: Self-pay | Admitting: Neurology

## 2022-08-03 ENCOUNTER — Other Ambulatory Visit: Payer: Self-pay | Admitting: Neurology

## 2022-09-18 ENCOUNTER — Other Ambulatory Visit: Payer: Self-pay | Admitting: Neurology

## 2022-09-29 ENCOUNTER — Other Ambulatory Visit: Payer: Self-pay | Admitting: Neurology

## 2022-10-04 NOTE — Progress Notes (Unsigned)
Due to the COVID-19 crisis, this telephone visit was done via telephone from my office and it was initiated and consent given by this patient and or family.  Telephone (Audio) Visit The purpose of this telephone visit is to provide medical care while limiting exposure to the novel coronavirus.  Attempted video virtual visit but was unable to connect.  Consent was obtained for telephone visit and initiated by pt/family:  Yes.   Answered questions that patient had about telehealth interaction:  Yes.   I discussed the limitations, risks, security and privacy concerns of performing an evaluation and management service by telephone. I also discussed with the patient that there may be a patient responsible charge related to this service. The patient expressed understanding and agreed to proceed.  Pt location: Home Physician Location: office Name of referring provider:  Merlene Laughter, MD I connected with .Kelly WARDELL and her husband at their initiation/request on 10/05/2022 at  3:30 PM EDT by telephone and verified that I am speaking with the correct person using two identifiers.  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  Mr. Gatta will reach out to me in a couple of weeks with an update. Continue lamotrigine 50mg  twice daily Lorazepam 0.5 to 1mg  twice daily as needed.  Follow up in June as scheduled.  Need for in person visit now:  No.  Subjective:  Kelly Cole is a 82 year old white female with multiple sclerosis, depression and chronic low back pain who follows up for new-onset seizure.  History obtained by her husband and reviewing hospital  records.   UPDATE: Current medications:  lamotrigine 50mg  twice daily. Quetiapine 50mg  at bedtime, Lorazepam 0.5mg -1mg  twice daily PRN, Buspirone 15mg  three times daily, temazepam 30mg  at bedtime, venlafaxine 100mg  three times daily.   Last seen in January 2023.  Under palliative care.  ***   HISTORY: As a teenager, she was hospitalized for bilateral leg weakness.  They thought Polio but never had a diagnosis.  Symptoms resolved.  She was diagnosed with MS in 1970 while in [place] after experiencing numbness and tingling in her fingers.  She may have received visual evoked potentials but was primarily diagnosed based on clinical findings.  Over the next decade, she gradually had decline in gait, developing a foot drop which caused her to trip.  In the mid-late 70s, she participated in a trial where she was treated with chlorambucil and drop foot resolved.  High doses of vitamin injections (C, B1, B2, B12, etc)   Over the last 10-15 years, she has had a gradual decline in ambulation.  She progressed from cane to walker about 10 years ago.  She has been in a wheelchair for the past 6 years, following a hemicolectomy.  MRI of brain from 2019 showed chronic demyelination.   She was admitted to [facility] on 09/20/2020 for seizure-like activity.  She was found by her husband unresponsive with roving eye movements rolled back and restless non-purposeful movements of all extremities.  Lasted 45-60 seconds.  Afterwards, she was lethargic and then exhibited teeth grinding with flailing of her arms.  EMS was called and she was brought to the ED.  Neurologic exam demonstrated bilateral  lower extremity contractures without antigravity movement and brief coarsse tremor of the left upper extremity.  DTRs were 3+ throughout.  CT head personally reviewed showed left basal ganglia lacunar infarct but no acute intracranial abnormality.  EEG showed diffuse encephalopathy but no epileptiform discharges or  electrographic seizures.  She was started on antibiotics for suspected aspiration pneumonia.  Urine culture was negative.  K and Mg were 3.3 and 1.4 respectively.  Na fell between 127-131.  She takes temazepam at home, which was switched to Ativan during hospitalization in order to prevent withdrawal.  He was discharged on Depakote sprinkles 500mg  every 12 hours.  Wellbutrin was discontinued.    Past disease modifying therapy:  Betaseron (for 1 year, stopped due to side effects and ineffective) Other past medications:  Depakote (for seizure - lethargy and diarrhea), levetiracetam (prescribed as a bridge during transition from Depakote to lamotrigine)   Objective:   There were no vitals filed for this visit.   Follow Up Instructions:      -I discussed the assessment and treatment plan with the patient. The patient was provided an opportunity to ask questions and all were answered. The patient agreed with the plan and demonstrated an understanding of the instructions.   The patient was advised to call back or seek an in-person evaluation if the symptoms worsen or if the condition fails to improve as anticipated.    Total Time spent in visit with the patient was:  12 minutes   Cira Servant, DO

## 2022-10-05 ENCOUNTER — Telehealth (INDEPENDENT_AMBULATORY_CARE_PROVIDER_SITE_OTHER): Payer: PPO | Admitting: Neurology

## 2022-10-05 ENCOUNTER — Encounter: Payer: Self-pay | Admitting: Neurology

## 2022-10-05 DIAGNOSIS — Z87898 Personal history of other specified conditions: Secondary | ICD-10-CM

## 2022-10-05 DIAGNOSIS — R419 Unspecified symptoms and signs involving cognitive functions and awareness: Secondary | ICD-10-CM | POA: Diagnosis not present

## 2022-10-05 DIAGNOSIS — R569 Unspecified convulsions: Secondary | ICD-10-CM

## 2022-10-05 DIAGNOSIS — G35 Multiple sclerosis: Secondary | ICD-10-CM

## 2022-10-05 DIAGNOSIS — G252 Other specified forms of tremor: Secondary | ICD-10-CM | POA: Diagnosis not present

## 2022-10-12 ENCOUNTER — Other Ambulatory Visit: Payer: Self-pay | Admitting: Neurology

## 2022-10-13 ENCOUNTER — Other Ambulatory Visit: Payer: Self-pay | Admitting: Neurology

## 2022-10-14 NOTE — Telephone Encounter (Signed)
Pts spouse is calling in stating that he is needing to get Rx: lamotrigine (LAMICTAL) 25 MG refill today b/c the pt will be out today.    Pharm: [facility] on [address] in [place]

## 2022-10-15 ENCOUNTER — Telehealth: Payer: Self-pay | Admitting: Neurology

## 2022-10-15 ENCOUNTER — Other Ambulatory Visit: Payer: Self-pay

## 2022-10-15 MED ORDER — LAMOTRIGINE 25 MG PO TABS: ORAL_TABLET | ORAL | 5 refills | Status: DC

## 2022-10-15 NOTE — Telephone Encounter (Signed)
Refills sent

## 2022-10-15 NOTE — Telephone Encounter (Signed)
Patient is going to be out of the Lamotrigine 25mg  ( [facility] on [address]) and the medication was denied. Patients husband called stating that she needs this medication before the weekend starts and doesn't understand why the patients medication was denied by the doctor

## 2022-11-19 IMAGING — DX DG CHEST 1V PORT
1 series · 1 of 1 positions shown · non-contrast
Comparison: 07/13/2018

CLINICAL DATA: Altered mental status and recent seizure activity

EXAM:
PORTABLE CHEST 1 VIEW

[chest ap]
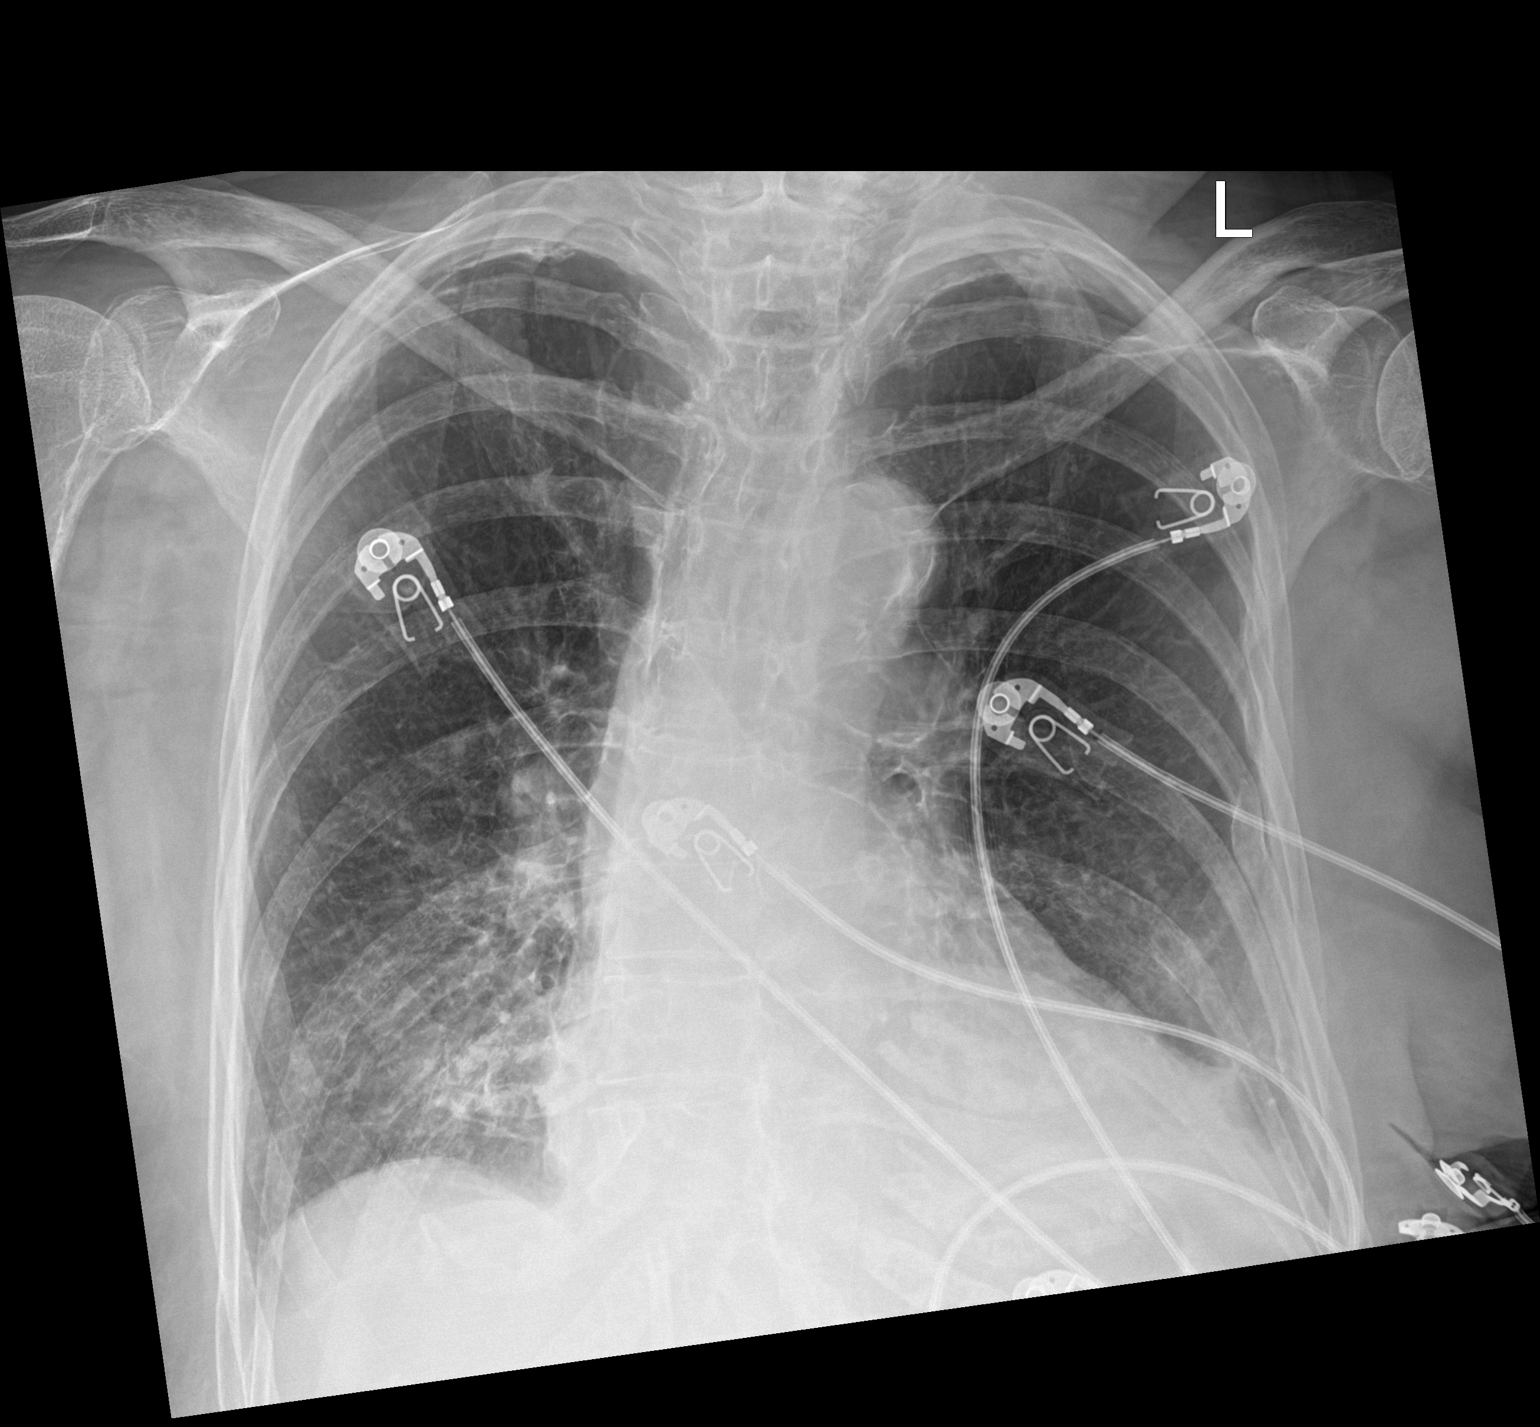

[1 of 1 positions shown; findings below may reference images not displayed]

FINDINGS: Cardiac shadow is within normal limits. Aortic calcifications are
seen. Mild left basilar atelectasis is noted. No sizable effusion is
noted. No bony abnormality is seen.
IMPRESSION: Mild left basilar atelectasis.

## 2022-11-24 IMAGING — DX DG CHEST 1V PORT
1 series · 1 of 1 positions shown · non-contrast
Comparison: Radiograph 09/20/2020

CLINICAL DATA: Dyspnea

EXAM:
PORTABLE CHEST 1 VIEW

[chest]
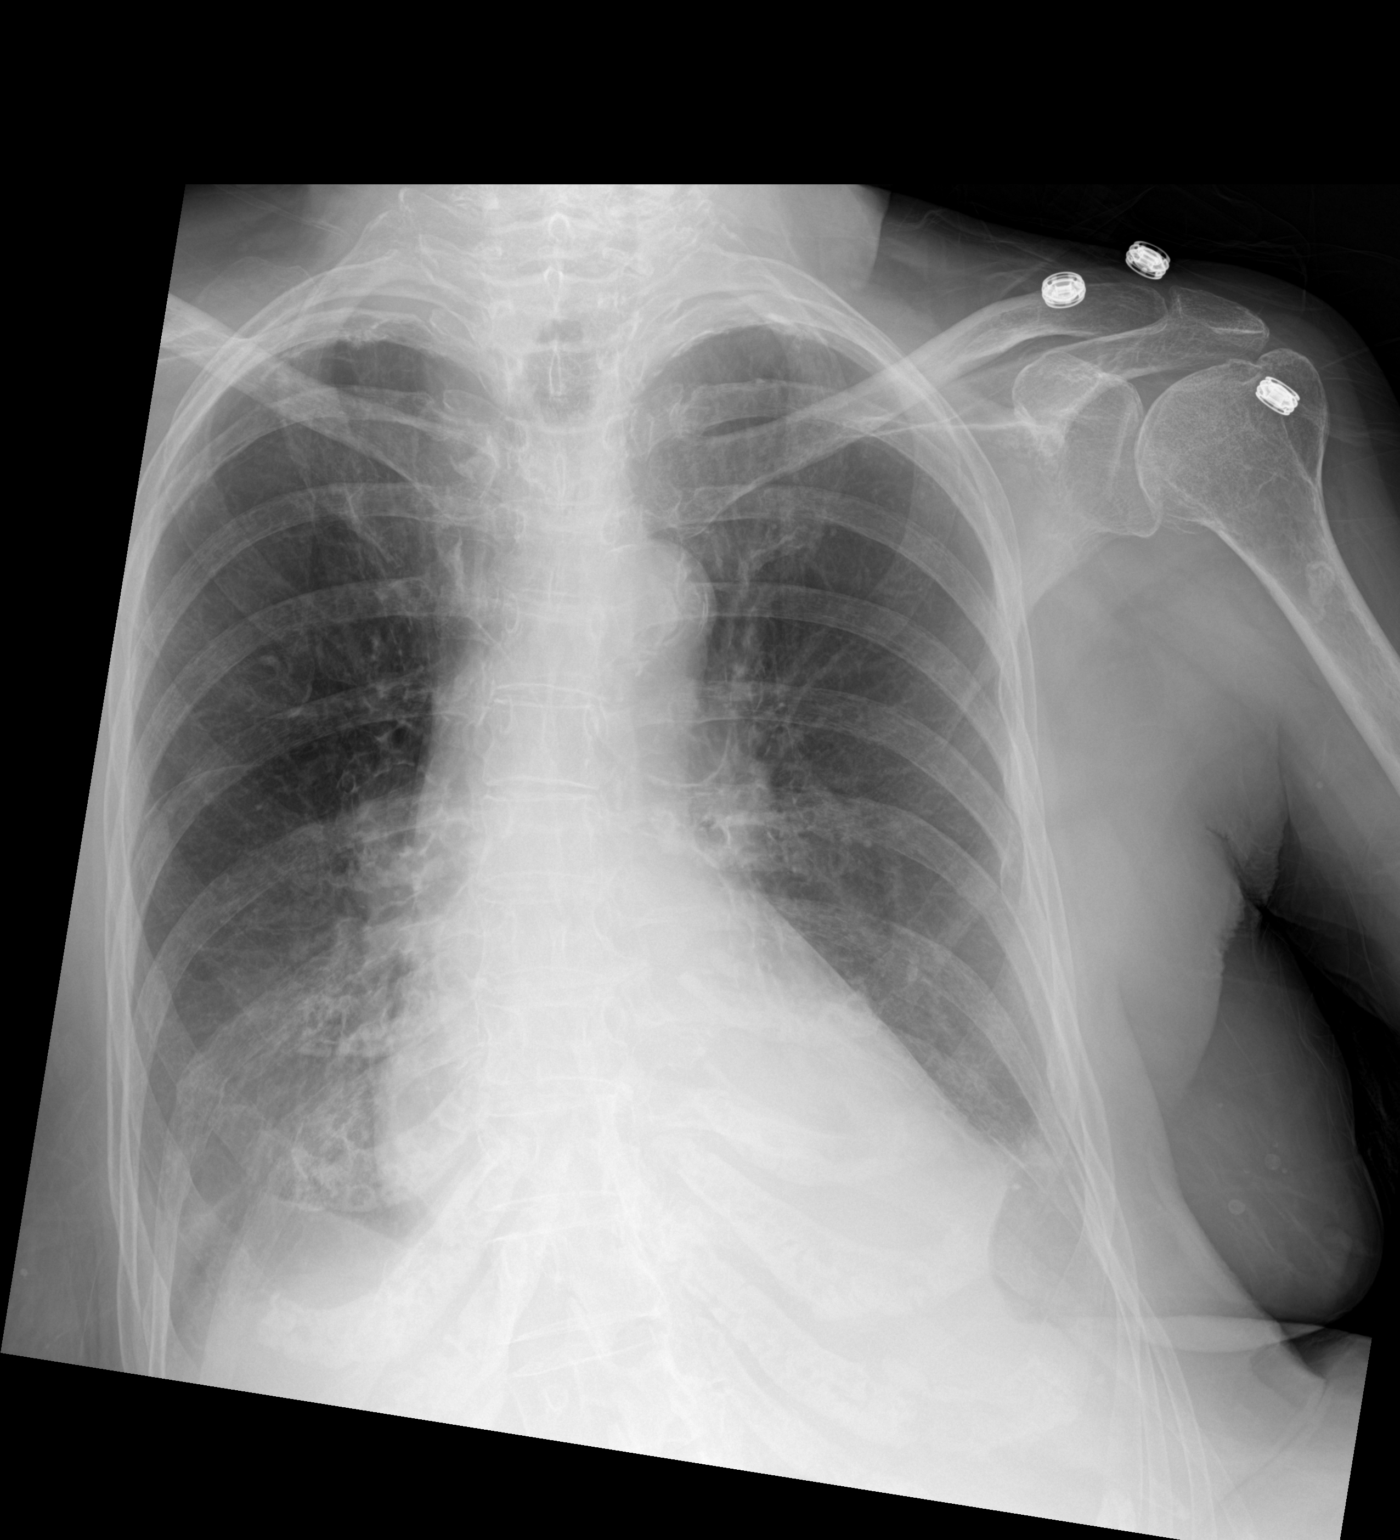

[1 of 1 positions shown; findings below may reference images not displayed]

FINDINGS: Unchanged, enlarged cardiac silhouette. There are bilateral pleural
effusions and adjacent bibasilar opacities. Pulmonary
hyperinflation. No visible pneumothorax. Multiple old healed
left-sided rib fractures. Left shoulder degenerative changes.
Unchanged apical pleural thickening/calcification.
IMPRESSION: Small to moderate-sized bilateral pleural effusions with adjacent
bibasilar opacities, which could be atelectasis or infection.

## 2022-12-07 ENCOUNTER — Emergency Department (HOSPITAL_COMMUNITY): Payer: PPO

## 2022-12-07 ENCOUNTER — Inpatient Hospital Stay (HOSPITAL_COMMUNITY)
Admission: EM | Admit: 2022-12-07 | Discharge: 2022-12-11 | DRG: 871 | Disposition: A | Discharge status: Home / Self Care | Payer: PPO | Attending: Internal Medicine | Admitting: Internal Medicine

## 2022-12-07 ENCOUNTER — Other Ambulatory Visit: Payer: Self-pay

## 2022-12-07 ENCOUNTER — Encounter (HOSPITAL_COMMUNITY): Payer: Self-pay

## 2022-12-07 DIAGNOSIS — R1310 Dysphagia, unspecified: Secondary | ICD-10-CM | POA: Diagnosis present

## 2022-12-07 DIAGNOSIS — Z9359 Other cystostomy status: Secondary | ICD-10-CM

## 2022-12-07 DIAGNOSIS — Z7401 Bed confinement status: Secondary | ICD-10-CM

## 2022-12-07 DIAGNOSIS — R569 Unspecified convulsions: Secondary | ICD-10-CM

## 2022-12-07 DIAGNOSIS — I10 Essential (primary) hypertension: Secondary | ICD-10-CM | POA: Diagnosis present

## 2022-12-07 DIAGNOSIS — A419 Sepsis, unspecified organism: Secondary | ICD-10-CM | POA: Diagnosis not present

## 2022-12-07 DIAGNOSIS — D638 Anemia in other chronic diseases classified elsewhere: Secondary | ICD-10-CM | POA: Diagnosis present

## 2022-12-07 DIAGNOSIS — E872 Acidosis, unspecified: Secondary | ICD-10-CM | POA: Diagnosis present

## 2022-12-07 DIAGNOSIS — G250 Essential tremor: Secondary | ICD-10-CM | POA: Diagnosis present

## 2022-12-07 DIAGNOSIS — Z1152 Encounter for screening for COVID-19: Secondary | ICD-10-CM

## 2022-12-07 DIAGNOSIS — G35 Multiple sclerosis: Secondary | ICD-10-CM | POA: Diagnosis present

## 2022-12-07 DIAGNOSIS — T83518A Infection and inflammatory reaction due to other urinary catheter, initial encounter: Principal | ICD-10-CM | POA: Diagnosis present

## 2022-12-07 DIAGNOSIS — N319 Neuromuscular dysfunction of bladder, unspecified: Secondary | ICD-10-CM | POA: Diagnosis present

## 2022-12-07 DIAGNOSIS — Z66 Do not resuscitate: Secondary | ICD-10-CM | POA: Diagnosis present

## 2022-12-07 DIAGNOSIS — G40909 Epilepsy, unspecified, not intractable, without status epilepticus: Secondary | ICD-10-CM | POA: Diagnosis present

## 2022-12-07 DIAGNOSIS — G35D Multiple sclerosis, unspecified: Secondary | ICD-10-CM | POA: Diagnosis present

## 2022-12-07 DIAGNOSIS — E861 Hypovolemia: Secondary | ICD-10-CM | POA: Diagnosis present

## 2022-12-07 DIAGNOSIS — N390 Urinary tract infection, site not specified: Secondary | ICD-10-CM | POA: Diagnosis not present

## 2022-12-07 DIAGNOSIS — Z91018 Allergy to other foods: Secondary | ICD-10-CM

## 2022-12-07 DIAGNOSIS — Z885 Allergy status to narcotic agent status: Secondary | ICD-10-CM

## 2022-12-07 DIAGNOSIS — E860 Dehydration: Secondary | ICD-10-CM | POA: Diagnosis present

## 2022-12-07 DIAGNOSIS — R6521 Severe sepsis with septic shock: Secondary | ICD-10-CM | POA: Diagnosis present

## 2022-12-07 DIAGNOSIS — G9341 Metabolic encephalopathy: Secondary | ICD-10-CM | POA: Diagnosis present

## 2022-12-07 DIAGNOSIS — M419 Scoliosis, unspecified: Secondary | ICD-10-CM | POA: Diagnosis present

## 2022-12-07 DIAGNOSIS — E871 Hypo-osmolality and hyponatremia: Secondary | ICD-10-CM | POA: Diagnosis present

## 2022-12-07 DIAGNOSIS — D529 Folate deficiency anemia, unspecified: Secondary | ICD-10-CM | POA: Diagnosis present

## 2022-12-07 DIAGNOSIS — J189 Pneumonia, unspecified organism: Secondary | ICD-10-CM | POA: Diagnosis present

## 2022-12-07 DIAGNOSIS — D649 Anemia, unspecified: Secondary | ICD-10-CM | POA: Diagnosis present

## 2022-12-07 DIAGNOSIS — T83511A Infection and inflammatory reaction due to indwelling urethral catheter, initial encounter: Secondary | ICD-10-CM

## 2022-12-07 DIAGNOSIS — Z79899 Other long term (current) drug therapy: Secondary | ICD-10-CM

## 2022-12-07 DIAGNOSIS — Z91012 Allergy to eggs: Secondary | ICD-10-CM

## 2022-12-07 DIAGNOSIS — M4140 Neuromuscular scoliosis, site unspecified: Secondary | ICD-10-CM

## 2022-12-07 DIAGNOSIS — Z515 Encounter for palliative care: Secondary | ICD-10-CM

## 2022-12-07 DIAGNOSIS — D509 Iron deficiency anemia, unspecified: Secondary | ICD-10-CM | POA: Diagnosis present

## 2022-12-07 DIAGNOSIS — Y846 Urinary catheterization as the cause of abnormal reaction of the patient, or of later complication, without mention of misadventure at the time of the procedure: Secondary | ICD-10-CM | POA: Diagnosis present

## 2022-12-07 LAB — COMPREHENSIVE METABOLIC PANEL
ALT: 12 U/L (ref 0–44)
AST: 13 U/L — ABNORMAL LOW (ref 15–41)
Albumin: 2.4 g/dL — ABNORMAL LOW (ref 3.5–5.0)
Alkaline Phosphatase: 57 U/L (ref 38–126)
Anion gap: 8 (ref 5–15)
BUN: 12 mg/dL (ref 8–23)
CO2: 19 mmol/L — ABNORMAL LOW (ref 22–32)
Calcium: 7.4 mg/dL — ABNORMAL LOW (ref 8.9–10.3)
Chloride: 92 mmol/L — ABNORMAL LOW (ref 98–111)
Creatinine, Ser: 0.45 mg/dL (ref 0.44–1.00)
GFR, Estimated: >60 mL/min (ref >60)
Glucose, Bld: 101 mg/dL — ABNORMAL HIGH (ref 70–99)
Potassium: 3.8 mmol/L (ref 3.5–5.1)
Sodium: 119 mmol/L — CL (ref 135–145)
Total Bilirubin: 0.9 mg/dL (ref 0.3–1.2)
Total Protein: 5 g/dL — ABNORMAL LOW (ref 6.5–8.1)

## 2022-12-07 LAB — URINALYSIS, W/ REFLEX TO CULTURE (INFECTION SUSPECTED)
Bacteria, UA: NONE SEEN
Bilirubin Urine: NEGATIVE
Glucose, UA: NEGATIVE mg/dL
Hgb urine dipstick: NEGATIVE
Ketones, ur: NEGATIVE mg/dL
Nitrite: POSITIVE — AB
Protein, ur: NEGATIVE mg/dL
Specific Gravity, Urine: 1.011 (ref 1.005–1.030)
pH: 6 (ref 5.0–8.0)

## 2022-12-07 LAB — CBC WITH DIFFERENTIAL/PLATELET
Abs Immature Granulocytes: 0.63 10*3/uL — ABNORMAL HIGH (ref 0.00–0.07)
Abs Immature Granulocytes: 0.16 10*3/uL — ABNORMAL HIGH (ref 0.00–0.07)
Basophils Absolute: 0.1 10*3/uL (ref 0.0–0.1)
Basophils Absolute: 0 10*3/uL (ref 0.0–0.1)
Basophils Relative: 0 %
Basophils Relative: 0 %
Eosinophils Absolute: 0.3 10*3/uL (ref 0.0–0.5)
Eosinophils Absolute: 0.1 10*3/uL (ref 0.0–0.5)
Eosinophils Relative: 1 %
Eosinophils Relative: 1 %
HCT: 33.5 % — ABNORMAL LOW (ref 36.0–46.0)
HCT: 29.1 % — ABNORMAL LOW (ref 36.0–46.0)
Hemoglobin: 10.5 g/dL — ABNORMAL LOW (ref 12.0–15.0)
Hemoglobin: 9.4 g/dL — ABNORMAL LOW (ref 12.0–15.0)
Immature Granulocytes: 4 %
Immature Granulocytes: 1 %
Lymphocytes Relative: 8 %
Lymphocytes Relative: 4 %
Lymphs Abs: 1.5 10*3/uL (ref 0.7–4.0)
Lymphs Abs: 0.7 10*3/uL (ref 0.7–4.0)
MCH: 28.5 pg (ref 26.0–34.0)
MCH: 28.6 pg (ref 26.0–34.0)
MCHC: 31.3 g/dL (ref 30.0–36.0)
MCHC: 32.3 g/dL (ref 30.0–36.0)
MCV: 90.8 fL (ref 80.0–100.0)
MCV: 88.4 fL (ref 80.0–100.0)
Monocytes Absolute: 0.9 10*3/uL (ref 0.1–1.0)
Monocytes Absolute: 1 10*3/uL (ref 0.1–1.0)
Monocytes Relative: 5 %
Monocytes Relative: 6 %
Neutro Abs: 14.8 10*3/uL — ABNORMAL HIGH (ref 1.7–7.7)
Neutro Abs: 15.6 10*3/uL — ABNORMAL HIGH (ref 1.7–7.7)
Neutrophils Relative %: 82 %
Neutrophils Relative %: 88 %
Platelets: 226 10*3/uL (ref 150–400)
Platelets: 210 10*3/uL (ref 150–400)
RBC: 3.69 MIL/uL — ABNORMAL LOW (ref 3.87–5.11)
RBC: 3.29 MIL/uL — ABNORMAL LOW (ref 3.87–5.11)
RDW: 14.2 % (ref 11.5–15.5)
RDW: 14.1 % (ref 11.5–15.5)
WBC: 18.2 10*3/uL — ABNORMAL HIGH (ref 4.0–10.5)
WBC: 17.6 10*3/uL — ABNORMAL HIGH (ref 4.0–10.5)
nRBC: 0 % (ref 0.0–0.2)
nRBC: 0 % (ref 0.0–0.2)

## 2022-12-07 LAB — BLOOD GAS, VENOUS
Acid-base deficit: 4.3 mmol/L — ABNORMAL HIGH (ref 0.0–2.0)
Bicarbonate: 20 mmol/L (ref 20.0–28.0)
O2 Saturation: 85.8 %
Patient temperature: 37
pCO2, Ven: 33 mm[Hg] — ABNORMAL LOW (ref 44–60)
pH, Ven: 7.39 (ref 7.25–7.43)
pO2, Ven: 47 mm[Hg] — ABNORMAL HIGH (ref 32–45)

## 2022-12-07 LAB — STREP PNEUMONIAE URINARY ANTIGEN: Strep Pneumo Urinary Antigen: NEGATIVE

## 2022-12-07 LAB — CREATININE, URINE, RANDOM: Creatinine, Urine: 53 mg/dL

## 2022-12-07 LAB — SODIUM, URINE, RANDOM: Sodium, Ur: 31 mmol/L

## 2022-12-07 LAB — CK: Total CK: 223 U/L (ref 38–234)

## 2022-12-07 LAB — FERRITIN: Ferritin: 205 ng/mL (ref 11–307)

## 2022-12-07 LAB — RETICULOCYTES
Immature Retic Fract: 24 % — ABNORMAL HIGH (ref 2.3–15.9)
RBC.: 3.32 MIL/uL — ABNORMAL LOW (ref 3.87–5.11)
Retic Count, Absolute: 66.7 10*3/uL (ref 19.0–186.0)
Retic Ct Pct: 2 % (ref 0.4–3.1)

## 2022-12-07 LAB — I-STAT CG4 LACTIC ACID, ED
Lactic Acid, Venous: 2.3 mmol/L (ref 0.5–1.9)
Lactic Acid, Venous: 3.1 mmol/L (ref 0.5–1.9)
Lactic Acid, Venous: 4 mmol/L (ref 0.5–1.9)
Lactic Acid, Venous: 0.9 mmol/L (ref 0.5–1.9)

## 2022-12-07 LAB — IRON AND TIBC
Iron: 12 ug/dL — ABNORMAL LOW (ref 28–170)
Saturation Ratios: 7 % — ABNORMAL LOW (ref 10.4–31.8)
TIBC: 168 ug/dL — ABNORMAL LOW (ref 250–450)
UIBC: 156 ug/dL

## 2022-12-07 LAB — PHOSPHORUS: Phosphorus: 1.9 mg/dL — ABNORMAL LOW (ref 2.5–4.6)

## 2022-12-07 LAB — APTT: aPTT: 30 s (ref 24–36)

## 2022-12-07 LAB — PROTIME-INR
INR: 1 (ref 0.8–1.2)
Prothrombin Time: 13.3 s (ref 11.4–15.2)

## 2022-12-07 LAB — PROCALCITONIN: Procalcitonin: 42.25 ng/mL

## 2022-12-07 LAB — RESP PANEL BY RT-PCR (RSV, FLU A&B, COVID)  RVPGX2
Influenza A by PCR: NEGATIVE
Influenza B by PCR: NEGATIVE
Resp Syncytial Virus by PCR: NEGATIVE
SARS Coronavirus 2 by RT PCR: NEGATIVE

## 2022-12-07 LAB — TSH: TSH: 2.705 u[IU]/mL (ref 0.350–4.500)

## 2022-12-07 LAB — MAGNESIUM: Magnesium: 1.3 mg/dL — ABNORMAL LOW (ref 1.7–2.4)

## 2022-12-07 MED ORDER — SODIUM CHLORIDE 0.9 % IV SOLN
500.0000 mg | Freq: Once | INTRAVENOUS | Status: AC
  Administered 2022-12-07: 500 mg via INTRAVENOUS
  Filled 2022-12-07: qty 5

## 2022-12-07 MED ORDER — VANCOMYCIN HCL 1250 MG/250ML IV SOLN: 1250.0000 mg | INTRAVENOUS | Status: DC

## 2022-12-07 MED ORDER — MAGNESIUM SULFATE 2 GM/50ML IV SOLN
2.0000 g | Freq: Once | INTRAVENOUS | Status: AC
  Administered 2022-12-07: 2 g via INTRAVENOUS
  Filled 2022-12-07: qty 50

## 2022-12-07 MED ORDER — ACETAMINOPHEN 500 MG PO TABS
1000.0000 mg | ORAL_TABLET | Freq: Once | ORAL | Status: AC
  Administered 2022-12-07: 1000 mg via ORAL
  Filled 2022-12-07: qty 2

## 2022-12-07 MED ORDER — ALBUMIN HUMAN 5 % IV SOLN
12.5000 g | Freq: Once | INTRAVENOUS | Status: AC
  Administered 2022-12-08: 12.5 g via INTRAVENOUS
  Filled 2022-12-07: qty 250

## 2022-12-07 MED ORDER — SODIUM CHLORIDE 0.9 % IV SOLN
500.0000 mg | INTRAVENOUS | Status: AC
  Administered 2022-12-08 – 2022-12-09 (×2): 500 mg via INTRAVENOUS
  Filled 2022-12-07 (×2): qty 5

## 2022-12-07 MED ORDER — POTASSIUM PHOSPHATES 15 MMOLE/5ML IV SOLN
15.0000 mmol | Freq: Once | INTRAVENOUS | Status: AC
  Administered 2022-12-07: 15 mmol via INTRAVENOUS
  Filled 2022-12-07: qty 5

## 2022-12-07 MED ORDER — VANCOMYCIN HCL IN DEXTROSE 1-5 GM/200ML-% IV SOLN
1000.0000 mg | Freq: Once | INTRAVENOUS | Status: AC
  Administered 2022-12-07: 1000 mg via INTRAVENOUS
  Filled 2022-12-07: qty 200

## 2022-12-07 MED ORDER — SODIUM CHLORIDE 0.9 % IV SOLN
2.0000 g | Freq: Once | INTRAVENOUS | Status: AC
  Administered 2022-12-07: 2 g via INTRAVENOUS
  Filled 2022-12-07: qty 12.5

## 2022-12-07 MED ORDER — LACTATED RINGERS IV BOLUS (SEPSIS)
1000.0000 mL | Freq: Once | INTRAVENOUS | Status: AC
  Administered 2022-12-07: 1000 mL via INTRAVENOUS

## 2022-12-07 MED ORDER — SODIUM CHLORIDE 0.9 % IV SOLN
2.0000 g | Freq: Two times a day (BID) | INTRAVENOUS | Status: DC
  Administered 2022-12-08: 2 g via INTRAVENOUS
  Filled 2022-12-07: qty 12.5

## 2022-12-07 MED ORDER — SODIUM CHLORIDE 0.9 % IV SOLN: 2.0000 g | Freq: Three times a day (TID) | INTRAVENOUS | Status: DC

## 2022-12-07 MED ORDER — SODIUM CHLORIDE 0.9 % IV SOLN
150.0000 mL/h | INTRAVENOUS | Status: DC
  Administered 2022-12-07: 150 mL/h via INTRAVENOUS

## 2022-12-07 NOTE — Assessment & Plan Note (Signed)
In the setting of dehydration Will follow serial BMET  Obtain urine electrolytes

## 2022-12-07 NOTE — ED Triage Notes (Signed)
PT BIBA for hypotension and lethargy x2days.  EMS stated family has held patient's blood pressure medicine yesterday and today due to the low measurements.    Pt denies nausea, vomiting  Pt is A&Ox4

## 2022-12-07 NOTE — Sepsis Progress Note (Signed)
Notified bedside nurse of need to draw repeat lactic acid. 

## 2022-12-07 NOTE — Sepsis Progress Note (Signed)
Notified provider and bedside nurse of need to order and draw repeat lactic acid #3 @ 1925.

## 2022-12-07 NOTE — Assessment & Plan Note (Signed)
Started on cefepime await results of urine culture

## 2022-12-07 NOTE — ED Notes (Signed)
Pt resting comfortably. Bed locked and in lowest position. Call light within reach. Pt given warm blankets for comfort. Pt has no needs at this time. Vital Signs being monitored.

## 2022-12-07 NOTE — Assessment & Plan Note (Signed)
Chronic indwelling

## 2022-12-07 NOTE — Assessment & Plan Note (Signed)
Chronic followed by neurology contributing to medical complexity

## 2022-12-07 NOTE — ED Provider Notes (Signed)
[place] EMERGENCY DEPARTMENT AT [facility] Provider Note   CSN: [id number] Arrival date & time: 12/07/22  1350     History  Chief Complaint  Patient presents with   Hypotension    Kelly Cole is a 82 y.o. female.  82 year old female with past medical history of MS and hypertension presenting to the emergency department today with concern for hypotension.  The patient has been more generally weak over the past few days.  Her blood pressures have been as low as in the 80s systolic at home.  Her husband's been holding her blood pressure medications.  The patient did have chills last night.  She has not had any fevers.  She does have a mild cough.  She does have a suprapubic catheter in place.  She reports that this has had decreased output and some darker urine than normal.  She was subsequently sent to the emergency department for further evaluation.        Home Medications Prior to Admission medications   Medication Sig Start Date End Date Taking? Authorizing Provider  acetaminophen (TYLENOL) 325 MG tablet Take 2 tablets (650 mg total) by mouth every 6 (six) hours as needed for mild pain (or Fever >/= 101). Patient taking differently: Take 500 mg by mouth every 6 (six) hours as needed for mild pain (or Fever >/= 101). 09/26/20   Berton Mount I, MD  amLODipine (NORVASC) 5 MG tablet Take 1 tablet (5 mg total) by mouth at bedtime. 09/26/20   Berton Mount I, MD  busPIRone (BUSPAR) 15 MG tablet TAKE 1 TABLET(15 MG) BY MOUTH THREE TIMES DAILY 08/03/22   Drema Dallas, DO  cholecalciferol (VITAMIN D3) 25 MCG (1000 UNIT) tablet Take 2,000 Units by mouth 3 (three) times daily. Patient not taking: Reported on 10/05/2022    [provider]  cholestyramine (QUESTRAN) 4 g packet Take 4 g by mouth 2 (two) times daily before lunch and supper.    [provider]  lamoTRIgine (LAMICTAL) 25 MG tablet TAKE 2 TABLETS BY MOUTH TWICE DAILY 10/15/22   Drema Dallas,  DO  loperamide (IMODIUM) 2 MG capsule Take 1 capsule (2 mg total) by mouth 3 (three) times daily as needed for diarrhea or loose stools. 09/26/20   Barnetta Chapel, MD  LORazepam (ATIVAN) 0.5 MG tablet Take 1 to 2 tablets twice daily as needed for agitation 11/26/20   Shon Millet R, DO  polyethylene glycol powder (GLYCOLAX/MIRALAX) 17 GM/SCOOP powder Dissolve 1 capful (17 g) in water and drink daily as needed for mild constipation. 09/26/20   Barnetta Chapel, MD  propranolol (INDERAL) 40 MG tablet Take 1 tablet (40 mg total) by mouth 2 (two) times daily. 09/26/20   Berton Mount I, MD  temazepam (RESTORIL) 15 MG capsule Take 30 mg by mouth at bedtime. 09/21/18   [provider]  venlafaxine (EFFEXOR) 100 MG tablet Take 1 tablet (100 mg total) by mouth 3 (three) times daily with meals. 09/26/20   Barnetta Chapel, MD      Allergies    Demerol  [meperidine]    Review of Systems   Review of Systems  Constitutional:  Positive for chills and fatigue.  Respiratory:  Positive for cough.   Genitourinary:  Positive for decreased urine volume.  All other systems reviewed and are negative.   Physical Exam Updated Vital Signs BP (!) 132/53 (BP Location: Right Arm)   Pulse (!) 101   Temp 98 F (36.7 C) (  Oral)   Resp 17   Ht 5' (1.524 m)   Wt 61.2 kg   SpO2 100%   BMI 26.37 kg/m  Physical Exam Vitals and nursing note reviewed.   Gen: Chronically ill-appearing, pale appearing, NAD Eyes: PERRL, EOMI HEENT: no oropharyngeal swelling Neck: trachea midline Resp: Manage to bilateral lung bases Card: RRR, no murmurs, rubs, or gallops Abd: nontender, nondistended, suprapubic catheter in place Extremities: no calf tenderness, no edema Vascular: 2+ radial pulses bilaterally, 2+ DP pulses bilaterally Skin: no rashes Psyc: acting appropriately   ED Results / Procedures / Treatments   Labs (all labs ordered are listed, but only abnormal results are displayed) Labs Reviewed   CBC WITH DIFFERENTIAL/PLATELET - Abnormal; Notable for the following components:      Result Value   WBC 18.2 (*)    RBC 3.69 (*)    Hemoglobin 10.5 (*)    HCT 33.5 (*)    Neutro Abs 14.8 (*)    Abs Immature Granulocytes 0.63 (*)    All other components within normal limits  URINALYSIS, W/ REFLEX TO CULTURE (INFECTION SUSPECTED) - Abnormal; Notable for the following components:   APPearance HAZY (*)    Nitrite POSITIVE (*)    Leukocytes,Ua LARGE (*)    All other components within normal limits  I-STAT CG4 LACTIC ACID, ED - Abnormal; Notable for the following components:   Lactic Acid, Venous 2.3 (*)    All other components within normal limits  I-STAT CG4 LACTIC ACID, ED - Abnormal; Notable for the following components:   Lactic Acid, Venous 3.1 (*)    All other components within normal limits  I-STAT CG4 LACTIC ACID, ED - Abnormal; Notable for the following components:   Lactic Acid, Venous 4.0 (*)    All other components within normal limits  RESP PANEL BY RT-PCR (RSV, FLU A&B, COVID)  RVPGX2  CULTURE, BLOOD (ROUTINE X 2)  CULTURE, BLOOD (ROUTINE X 2)  PROTIME-INR  APTT  I-STAT CG4 LACTIC ACID, ED    EKG EKG Interpretation Date/Time:  Tuesday December 07 2022 16:02:14 EDT Ventricular Rate:  82 PR Interval:  166 QRS Duration:  93 QT Interval:  396 QTC Calculation: 463 R Axis:   42  Text Interpretation: Sinus rhythm Confirmed by Beckey Downing [phone]) on 12/07/2022 8:20:06 PM  Radiology No results found.  Procedures Procedures    Medications Ordered in ED Medications  azithromycin (ZITHROMAX) 500 mg in sodium chloride 0.9 % 250 mL IVPB (has no administration in time range)  lactated ringers bolus 1,000 mL (1,000 mLs Intravenous New Bag/Given 12/07/22 1634)    And  lactated ringers bolus 1,000 mL (1,000 mLs Intravenous New Bag/Given 12/07/22 1722)  ceFEPIme (MAXIPIME) 2 g in sodium chloride 0.9 % 100 mL IVPB (0 g Intravenous Stopped 12/07/22 1653)    ED Course/  Medical Decision Making/ A&P                                 Medical Decision Making 82 year old female with past medical history of MS with chronic indwelling suprapubic catheter presenting to emergency department today with chills and hypotension.  I will initiate a sepsis workup on the patient.  Will cover her empirically with cefepime to begin with for possible complicated UTI given the change in urine consistency and decreased output.  I will broaden accordingly if her x-ray shows any findings concerning for respiratory infection.  I will give the patient  30 mill per KG sepsis fluids as her maps on my assessment are 59.  She will require admission.  The patient's lactic acid is mildly elevated and is climbing despite IV fluids.  Her blood pressure has improved however.  The patient's urinalysis does show some obvious concerning for infection but this is from a suprapubic catheter.  Her chest x-ray interpreted by me does show some infiltrates.  The patient already received cefepime for her urine.  I did call discuss this with our clinical pharmacist.  She is given azithromycin for coverage for atypical pneumonia.  A call was placed to hospitalist service for admission.  Critical care time 36 minutes including reassessments, review of old records, treatment of initial hypotension and sepsis with IV fluids and appropriate antibiotics, coordination of care with hospital service  Amount and/or Complexity of Data Reviewed Radiology: ordered.           Final Clinical Impression(s) / ED Diagnoses Final diagnoses:  Sepsis, due to unspecified organism, unspecified whether acute organ dysfunction present [facility])  Urinary tract infection without hematuria, site unspecified  Pneumonia due to infectious organism, unspecified laterality, unspecified part of lung    Rx / DC Orders ED Discharge Orders     None         Durwin Glaze, MD 12/07/22 2022

## 2022-12-07 NOTE — Progress Notes (Signed)
A consult was received from an ED physician for Cefepime per pharmacy dosing.  The patient's profile has been reviewed for ht/wt/allergies/indication/available labs.    A one time order has been placed for Cefepime 2g.    Further antibiotics/pharmacy consults should be ordered by admitting physician if indicated.                       Thank you,  Lynann Beaver PharmD, BCPS [facility] main pharmacy [phone] 12/07/2022 4:36 PM

## 2022-12-07 NOTE — Sepsis Progress Note (Signed)
Elink monitoring for the code sepsis protocol.  

## 2022-12-07 NOTE — Assessment & Plan Note (Signed)
- -  Patient presenting with  productive cough, fever  , and infiltrate in   lower lobe on chest x-ray -Infiltrate on CXR and 2-3 characteristics (fever, leukocytosis, purulent sputum) are consistent with pneumonia. -This appears to be most likely community-acquired pneumonia.     will admit for treatment of CAP will start on appropriate antibiotic coverage. -Cefepime azithromycin   Obtain:  sputum cultures,                                   influenza serologies negative                  COVID PCR negative                     blood cultures and sputum cultures ordered                   strep pneumo UA antigen,                 check for Legionella antigen.                Provide oxygen as needed.

## 2022-12-07 NOTE — Assessment & Plan Note (Signed)
Chronic contributing to difficulty with  medical management

## 2022-12-07 NOTE — Assessment & Plan Note (Signed)
Family states currently not on any therapy She is on small dose of Lamictal will cont

## 2022-12-07 NOTE — Assessment & Plan Note (Signed)
Allow permissive hypertension 

## 2022-12-07 NOTE — Assessment & Plan Note (Signed)
 Obtain anemia panel  Transfuse for Hg <7 , rapidly dropping or  if symptomatic

## 2022-12-07 NOTE — Assessment & Plan Note (Signed)
-  SIRS criteria met with elevated white blood cell count,       Component Value Date/Time   WBC 18.2 (H) 12/07/2022 1501   LYMPHSABS 1.5 12/07/2022 1501     tachycardia   ,    fever   RR >20 Today's Vitals   12/07/22 1848 12/07/22 2100 12/07/22 2115 12/07/22 2130  BP:  111/70 102/62 (!) 101/58  Pulse:  (!) 111 (!) 108 (!) 107  Resp:  17 (!) 21 (!) 23  Temp:      TempSrc:      SpO2:  97% 97% 98%  Weight:      Height:      PainSc: 10-Worst pain ever       -Most likely source being:  urinary, pulmonary   Patient meeting criteria for Severe sepsis with    evidence of end organ damage/organ dysfunction such as     elevated lactic acid >2    acute metabolic encephalopathy      Patient is meeting criteria for SEPTIC SHOCK with  lactic acid > 4 or multiple SBP readings below 90 mmhg or MAP reading below 65 mmhg   - Obtain serial lactic acid and procalcitonin level.  - Initiated IV antibiotics in ER: Antibiotics Given (last 72 hours)     Date/Time Action Medication Dose Rate   12/07/22 1623 New Bag/Given   ceFEPIme (MAXIPIME) 2 g in sodium chloride 0.9 % 100 mL IVPB 2 g 200 mL/hr   12/07/22 2057 New Bag/Given   azithromycin (ZITHROMAX) 500 mg in sodium chloride 0.9 % 250 mL IVPB 500 mg 250 mL/hr       Will continue  on :cefepime/azithro will add vanc   - await results of blood and urine culture  - Rehydrate aggressively  Intravenous fluids were administered,    30cc/kg fluid    9:56 PM

## 2022-12-07 NOTE — Subjective & Objective (Signed)
Has been hypotensive for the past few days and lethargic Family held her blood pressure medicines Has been progressively feeling more weak has multiple sclerosis at baseline Chills last 9 no fevers does have a mild cough Status post suprapubic catheter which is chronic she has been having somewhat darker urine and decreased urination

## 2022-12-07 NOTE — Progress Notes (Addendum)
Pharmacy Antibiotic Note  Kelly Cole is a 82 y.o. female admitted on 12/07/2022 with sepsis.  Pharmacy has been consulted for Cefepime dosing.  Plan: Cefepime 2g IV q8h, pending review of renal function.   Follow up renal function, culture results, and clinical course.   Height: 5' (152.4 cm) Weight: 61.2 kg (135 lb) IBW/kg (Calculated) : 45.5  Temp (24hrs), Avg:98 F (36.7 C), Min:98 F (36.7 C), Max:98 F (36.7 C)  Recent Labs  Lab 12/07/22 1501 12/07/22 1513 12/07/22 1725 12/07/22 2013  WBC 18.2*  --   --   --   LATICACIDVEN  --  2.3* 3.1* 4.0*    CrCl cannot be calculated (Patient's most recent lab result is older than the maximum 21 days allowed.).    Allergies  Allergen Reactions   Demerol  [Meperidine] Other (See Comments)    Antimicrobials this admission: 10/29 Cefepime >>  10/29 Azith >>   Dose adjustments this admission:   Microbiology results: 10/29 Resp panel: neg covid, flu, rsv 10/29 BCx:  10/29 UCx:   Thank you for allowing pharmacy to be a part of this patient's care.  Lynann Beaver PharmD, BCPS [facility] main pharmacy [phone] 12/07/2022 8:59 PM  Pharmacy consulted to dose vancomycin for sepsis.  Vanc 1gm IV x 1 then 1250mg  q36h (AUC 507.3, used Scr 0.8) Adjusted cefepime to 2gm IV q12h for CrCl < 42ml/min   Arley Phenix RPh 12/07/2022, 10:28 PM

## 2022-12-07 NOTE — H&P (Signed)
Kelly Cole:[id number] DOB: February 18, 1940 DOA: 12/07/2022     PCP: Merlene Laughter, MD (Inactive)   Outpatient Specialists:     NEphrology:   Dr.Jaffe    Patient arrived to ER on 12/07/22 at 1350 Referred by Attending Durwin Glaze, MD   Patient coming from:    home Lives  With family    Chief Complaint:   Chief Complaint  Patient presents with   Hypotension    HPI: Kelly Cole is a 82 y.o. female with medical history significant of MS, HTN, neurogenic bladder status post suprapubic catheter history of seizure    Presented with low blood pressures Has been hypotensive for the past few days and lethargic Family held her blood pressure medicines Has been progressively feeling more weak has multiple sclerosis at baseline Chills last 9 no fevers does have a mild cough Status post suprapubic catheter which is chronic she has been having somewhat darker urine and decreased urination Patient has history of multiple sclerosis for decades eventually resulting in scoliosis which made walking impossible she is currently bedbound Family has been taking great care of her She also has an aide that comes out every day Family states they have not had any trouble recently with skin breakdown using special pads She has been evaluated in the past for aspiration and was able to graduate from SLP she has occasional mild cough when she does start eating and drinking For past few days clearly have been feeling worse with fevers chills coughing She has a suprapubic catheter and urine has been a bit more cloudy Patient may have had some decreased sensation so cannot really tell for significant abdominal pain but have not had any complaints regarding that No constipation she usually deals with diarrhea which is chronic for her  Denies significant ETOH intake   Does not smoke   Lab Results  Component Value Date   SARSCOV2NAA NEGATIVE 12/07/2022   SARSCOV2NAA NEGATIVE 09/21/2020         Regarding pertinent Chronic problems:       HTN on Norvasc Inderal    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 Infiltrates bilateral  Initially BP was in 90s and she responded to fluids Started on cefepime and azithromycin   Lab Orders         Blood Culture (routine x 2)         Resp panel by RT-PCR (RSV, Flu A&B, Covid) Anterior Nasal Swab         CBC with Differential         Urinalysis, w/ Reflex to Culture (Infection Suspected) -Urine, Clean Catch         Protime-INR         APTT         I-Stat Lactic Acid, ED         I-Stat CG4 Lactic Acid       CXR -personally reviewed possible bilateral infiltrates  CTabd/pelvis - ordered    Following Medications were ordered in ER: Medications  azithromycin (ZITHROMAX) 500 mg in sodium chloride 0.9 % 250 mL IVPB (has no administration in time range)  lactated ringers bolus 1,000 mL (1,000 mLs Intravenous New Bag/Given 12/07/22 1634)    And  lactated ringers bolus 1,000 mL (1,000 mLs Intravenous New Bag/Given 12/07/22 1722)  ceFEPIme (MAXIPIME) 2 g in sodium chloride 0.9 % 100  mL IVPB (0 g Intravenous Stopped 12/07/22 1653)       ED Triage Vitals  Encounter Vitals Group     BP 12/07/22 1413 (!) 97/47     Systolic BP Percentile --      Diastolic BP Percentile --      Pulse Rate 12/07/22 1413 76     Resp 12/07/22 1413 (!) 24     Temp 12/07/22 1413 98 F (36.7 C)     Temp Source 12/07/22 1413 Rectal     SpO2 12/07/22 1413 99 %     Weight 12/07/22 1428 135 lb (61.2 kg)     Height 12/07/22 1428 5' (1.524 m)     Head Circumference --      Peak Flow --      Pain Score 12/07/22 1848 10     Pain Loc --      Pain Education --      Exclude from Growth Chart --   VWUJ(81)@     _________________________________________ Significant initial  Findings: Abnormal Labs Reviewed  CBC WITH DIFFERENTIAL/PLATELET - Abnormal; Notable for the following components:       Result Value   WBC 18.2 (*)    RBC 3.69 (*)    Hemoglobin 10.5 (*)    HCT 33.5 (*)    Neutro Abs 14.8 (*)    Abs Immature Granulocytes 0.63 (*)    All other components within normal limits  URINALYSIS, W/ REFLEX TO CULTURE (INFECTION SUSPECTED) - Abnormal; Notable for the following components:   APPearance HAZY (*)    Nitrite POSITIVE (*)    Leukocytes,Ua LARGE (*)    All other components within normal limits  I-STAT CG4 LACTIC ACID, ED - Abnormal; Notable for the following components:   Lactic Acid, Venous 2.3 (*)    All other components within normal limits  I-STAT CG4 LACTIC ACID, ED - Abnormal; Notable for the following components:   Lactic Acid, Venous 3.1 (*)    All other components within normal limits  I-STAT CG4 LACTIC ACID, ED - Abnormal; Notable for the following components:   Lactic Acid, Venous 4.0 (*)    All other components within normal limits     ECG: Ordered Personally reviewed and interpreted by me showing: HR : 82 Rhythm:  NSR,  no evidence of ischemic changes QTC 463   ____________________ This patient meets SIRS Criteria and may be septic.   The recent clinical data is shown below. Vitals:   12/07/22 1428 12/07/22 1630 12/07/22 1705 12/07/22 1824  BP:  (!) 123/44 (!) 146/73 (!) 132/53  Pulse:   99 (!) 101  Resp:  (!) 29 (!) 26 17  Temp:    98 F (36.7 C)  TempSrc:    Oral  SpO2:   98% 100%  Weight: 61.2 kg     Height: 5' (1.524 m)       WBC     Component Value Date/Time   WBC 18.2 (H) 12/07/2022 1501   LYMPHSABS 1.5 12/07/2022 1501   MONOABS 0.9 12/07/2022 1501   EOSABS 0.3 12/07/2022 1501   BASOSABS 0.1 12/07/2022 1501    Lactic Acid, Venous    Component Value Date/Time   LATICACIDVEN 4.0 (HH) 12/07/2022 2013     Lactic Acid, Venous    Component Value Date/Time   LATICACIDVEN 0.9 12/07/2022 2145    Procalcitonin   Ordered      UA   no evidence of UTI    Urine analysis:  Component Value Date/Time   COLORURINE YELLOW  12/07/2022 1501   APPEARANCEUR HAZY (A) 12/07/2022 1501   LABSPEC 1.011 12/07/2022 1501   PHURINE 6.0 12/07/2022 1501   GLUCOSEU NEGATIVE 12/07/2022 1501   HGBUR NEGATIVE 12/07/2022 1501   BILIRUBINUR NEGATIVE 12/07/2022 1501   KETONESUR NEGATIVE 12/07/2022 1501   PROTEINUR NEGATIVE 12/07/2022 1501   NITRITE POSITIVE (A) 12/07/2022 1501   LEUKOCYTESUR LARGE (A) 12/07/2022 1501    Results for orders placed or performed during the hospital encounter of 12/07/22  Resp panel by RT-PCR (RSV, Flu A&B, Covid) Anterior Nasal Swab     Status: None   Collection Time: 12/07/22  4:40 PM   Specimen: Anterior Nasal Swab  Result Value Ref Range Status   SARS Coronavirus 2 by RT PCR NEGATIVE NEGATIVE Final         Influenza A by PCR NEGATIVE NEGATIVE Final   Influenza B by PCR NEGATIVE NEGATIVE Final         Resp Syncytial Virus by PCR NEGATIVE NEGATIVE Final          ABX started Antibiotics Given (last 72 hours)     Date/Time Action Medication Dose Rate   12/07/22 1623 New Bag/Given   ceFEPIme (MAXIPIME) 2 g in sodium chloride 0.9 % 100 mL IVPB 2 g 200 mL/hr        ___ Venous  Blood Gas result:  pH  Latest Reference Range & Units 12/07/22 21:36  pH, Ven 7.25 - 7.43  7.39  pCO2, Ven 44 - 60 mmHg 33 (L)  pO2, Ven 32 - 45 mmHg 47 (H)  (L): Data is abnormally low (H): Data is abnormally high __________________________________________________________ Recent Labs  Lab 12/07/22 2133  NA 119*  K 3.8  CO2 19*  GLUCOSE 101*  BUN 12  CREATININE 0.45  CALCIUM 7.4*  MG 1.3*  PHOS 1.9*    Cr  * Up from baseline see below Lab Results  Component Value Date   CREATININE 0.45 12/07/2022   CREATININE 0.38 (L) 09/26/2020   CREATININE 0.42 (L) 09/24/2020    Recent Labs  Lab 12/07/22 2133  AST 13*  ALT 12  ALKPHOS 57  BILITOT 0.9  PROT 5.0*  ALBUMIN 2.4*   Lab Results  Component Value Date   CALCIUM 9.0 09/26/2020    Plt: Lab Results  Component Value Date   PLT 226  12/07/2022    Recent Labs  Lab 12/07/22 1501 12/07/22 2133  WBC 18.2* 17.6*  NEUTROABS 14.8* 15.6*  HGB 10.5* 9.4*  HCT 33.5* 29.1*  MCV 90.8 88.4  PLT 226 210    HG/HCT  stable,     Component Value Date/Time   HGB 10.5 (L) 12/07/2022 1501   HCT 33.5 (L) 12/07/2022 1501   MCV 90.8 12/07/2022 1501    _______________________________________________ Hospitalist was called for admission for   Septic shock UTI, CAP      The following Work up has been ordered so far:  Orders Placed This Encounter  Procedures   Blood Culture (routine x 2)   Resp panel by RT-PCR (RSV, Flu A&B, Covid) Anterior Nasal Swab   DG Chest Portable 1 View   CBC with Differential   Urinalysis, w/ Reflex to Culture (Infection Suspected) -Urine, Clean Catch   Protime-INR   APTT   Diet NPO time specified   Document height and weight   Assess and Document Glasgow Coma Scale   Document vital signs within 1-hour of fluid bolus completion.  Notify provider of  abnormal vital signs despite fluid resuscitation.   Refer to Sidebar Report: Sepsis Bundle ED/IP   Notify provider for difficulties obtaining IV access   Initiate Carrier Fluid Protocol   DO NOT delay antibiotics if unable to obtain blood culture.   Code Sepsis activation.  This occurs automatically when order is signed and prioritizes pharmacy, lab, and radiology services for STAT collections and interventions.  If [facility] downtime, call [facility] [phone]) to activate Code Sepsis.   Consult to hospitalist   I-Stat Lactic Acid, ED   I-Stat CG4 Lactic Acid   EKG 12-Lead   EKG 12-Lead   ED EKG 12-Lead   EKG 12-Lead   Insert peripheral IV X 1   Insert 2nd peripheral IV if not already present.     OTHER Significant initial  Findings:  labs showing:     Cultures:    Component Value Date/Time   SDES URINE, SUPRAPUBIC 09/22/2020 0750   SPECREQUEST NONE 09/22/2020 0750   CULT  09/22/2020 0750    NO GROWTH Performed at [facility],  [address], [place], [place] [address]    REPTSTATUS 09/23/2020 FINAL 09/22/2020 0750     Radiological Exams on Admission: No results found. _______________________________________________________________________________________________________ Latest  Blood pressure (!) 132/53, pulse (!) 101, temperature 98 F (36.7 C), temperature source Oral, resp. rate 17, height 5' (1.524 m), weight 61.2 kg, SpO2 100%.   Vitals  labs and radiology finding personally reviewed  Review of Systems:    Pertinent positives include:  , Fevers, chills, fatigue,  confusion non-productive cough,  Constitutional:  No weight loss, night sweats weight loss  HEENT:  No headaches, Difficulty swallowing,Tooth/dental problems,Sore throat,  No sneezing, itching, ear ache, nasal congestion, post nasal drip,  Cardio-vascular:  No chest pain, Orthopnea, PND, anasarca, dizziness, palpitations.no Bilateral lower extremity swelling  GI:  No heartburn, indigestion, abdominal pain, nausea, vomiting, diarrhea, change in bowel habits, loss of appetite, melena, blood in stool, hematemesis Resp:  no shortness of breath at rest. No dyspnea on exertion, No excess mucus, no productive cough, NoNo coughing up of blood.No change in color of mucus.No wheezing. Skin:  no rash or lesions. No jaundice GU:  no dysuria, change in color of urine, no urgency or frequency. No straining to urinate.  No flank pain.  Musculoskeletal:  No joint pain or no joint swelling. No decreased range of motion. No back pain.  Psych:  No change in mood or affect. No depression or anxiety. No memory loss.  Neuro: no localizing neurological complaints, no tingling, no weakness, no double vision, no gait abnormality, no slurred speech, n  All systems reviewed and apart from HOPI all are negative _______________________________________________________________________________________________ Past Medical History:   Past Medical History:  Diagnosis  Date   HTN (hypertension)    Multiple sclerosis (HCC)      Past Surgical History:  Procedure Laterality Date   ABDOMINAL HYSTERECTOMY     APPENDECTOMY     CHOLECYSTECTOMY     HYSTERCTOMY      Social History:  Ambulatory  bed bound     reports that she has never smoked. She has never used smokeless tobacco. She reports current alcohol use. She reports that she does not use drugs.   Family History:   History reviewed. No pertinent family history. ______________________________________________________________________________________________ Allergies: Allergies  Allergen Reactions   Demerol  [Meperidine] Other (See Comments)     Prior to Admission medications   Medication Sig Start Date End Date Taking? Authorizing Provider  acetaminophen (TYLENOL) 325 MG tablet Take 2 tablets (650 mg total) by mouth every 6 (six) hours as needed for mild pain (or Fever >/= 101). Patient taking differently: Take 500 mg by mouth every 6 (six) hours as needed for mild pain (or Fever >/= 101). 09/26/20   Berton Mount I, MD  amLODipine (NORVASC) 5 MG tablet Take 1 tablet (5 mg total) by mouth at bedtime. 09/26/20   Berton Mount I, MD  busPIRone (BUSPAR) 15 MG tablet TAKE 1 TABLET(15 MG) BY MOUTH THREE TIMES DAILY 08/03/22   Drema Dallas, DO  cholestyramine light (PREVALITE) 4 g packet Take 4 g by mouth 4 (four) times daily. 11/24/22   [provider]  fluticasone (FLONASE) 50 MCG/ACT nasal spray Place 1 spray into both nostrils daily. 11/23/22   [provider]  lamoTRIgine (LAMICTAL) 25 MG tablet TAKE 2 TABLETS BY MOUTH TWICE DAILY 10/15/22   Everlena Cooper, Adam R, DO  lisinopril (ZESTRIL) 40 MG tablet Take 40 mg by mouth daily.    [provider]  loperamide (IMODIUM) 2 MG capsule Take 1 capsule (2 mg total) by mouth 3 (three) times daily as needed for diarrhea or loose stools. 09/26/20   Barnetta Chapel, MD  LORazepam (ATIVAN) 0.5 MG tablet Take 1 to 2 tablets twice  daily as needed for agitation 11/26/20   Shon Millet R, DO  polyethylene glycol powder (GLYCOLAX/MIRALAX) 17 GM/SCOOP powder Dissolve 1 capful (17 g) in water and drink daily as needed for mild constipation. 09/26/20   Barnetta Chapel, MD  propranolol (INDERAL) 40 MG tablet Take 1 tablet (40 mg total) by mouth 2 (two) times daily. 09/26/20   Barnetta Chapel, MD  QUEtiapine (SEROQUEL) 25 MG tablet Take 50 mg by mouth at bedtime.    [provider]  temazepam (RESTORIL) 30 MG capsule Take 30 mg by mouth at bedtime as needed for sleep.    [provider]  venlafaxine (EFFEXOR) 100 MG tablet Take 1 tablet (100 mg total) by mouth 3 (three) times daily with meals. 09/26/20   Barnetta Chapel, MD   ____________________________________________________________________________________ Physical Exam:    12/07/2022    6:24 PM 12/07/2022    5:05 PM 12/07/2022    4:30 PM  Vitals with BMI  Systolic 132 146 409  Diastolic 53 73 44  Pulse 101 99      1. General:  in No  Acute distress  Chronically ill   -appearing 2. Psychological: Alert and   Oriented to self  3. Head/ENT:    Dry Mucous Membranes                          Head Non traumatic, neck supple                      Poor Dentition 4. SKIN:  decreased Skin turgor,  Skin clean Dry and intact no rash 5. Heart: Regular rate and rhythm no  Murmur, no Rub or gallop 6. Lungs:   no wheezes mild crackles   7. Abdomen: Soft,  non-tender, Non distended  bowel sounds present 8. Lower extremities: no clubbing, cyanosis, no  edema 9. Neurologically decreased strength in the lower ext 10. MSK: Normal range of motion    Chart has been reviewed  ______________________________________________________________________________________________  Assessment/Plan 82 y.o. female with medical history significant of MS, HTN, neurogenic bladder status post suprapubic catheter history of seizure  Admitted for  Sepsis,  UTI, Pneumonia       Present on Admission:  Multiple sclerosis (HCC)  Essential hypertension  Scoliosis  Essential tremor  UTI (urinary tract infection) due to urinary indwelling catheter (HCC)  CAP (community acquired pneumonia)  Septic shock (HCC)  Anemia  Hyponatremia  Sepsis (HCC)  Hypomagnesemia  Lactic acidosis     Multiple sclerosis (HCC) Chronic followed by neurology contributing to medical complexity  Essential hypertension Allow permissive hypertension  Scoliosis Chronic contributing to difficulty with  medical management  Essential tremor chronic  Seizure (HCC) Family states currently not on any therapy She is on small dose of Lamictal will cont  Suprapubic catheter (HCC) Chronic indwelling   UTI (urinary tract infection) due to urinary indwelling catheter (HCC) Started on cefepime await results of urine culture  CAP (community acquired pneumonia)  - -Patient presenting with  productive cough, fever  , and infiltrate in   lower lobe on chest x-ray -Infiltrate on CXR and 2-3 characteristics (fever, leukocytosis, purulent sputum) are consistent with pneumonia. -This appears to be most likely community-acquired pneumonia.     will admit for treatment of CAP will start on appropriate antibiotic coverage. -Cefepime azithromycin   Obtain:  sputum cultures,                                   influenza serologies negative                  COVID PCR negative                     blood cultures and sputum cultures ordered                   strep pneumo UA antigen,                 check for Legionella antigen.                Provide oxygen as needed.    Septic shock (HCC)  -SIRS criteria met with elevated white blood cell count,       Component Value Date/Time   WBC 18.2 (H) 12/07/2022 1501   LYMPHSABS 1.5 12/07/2022 1501     tachycardia   ,    fever   RR >20 Today's Vitals   12/07/22 1848 12/07/22 2100 12/07/22 2115 12/07/22 2130  BP:  111/70 102/62 (!) 101/58  Pulse:   (!) 111 (!) 108 (!) 107  Resp:  17 (!) 21 (!) 23  Temp:      TempSrc:      SpO2:  97% 97% 98%  Weight:      Height:      PainSc: 10-Worst pain ever       -Most likely source being:  urinary, pulmonary   Patient meeting criteria for Severe sepsis with    evidence of end organ damage/organ dysfunction such as     elevated lactic acid >2    acute metabolic encephalopathy      Patient is meeting criteria for SEPTIC SHOCK with  lactic acid > 4 or multiple SBP readings below 90 mmhg or MAP reading below 65 mmhg   - Obtain serial lactic acid and procalcitonin level.  - Initiated IV antibiotics in ER: Antibiotics Given (last 72 hours)     Date/Time Action Medication Dose Rate   12/07/22 1623 New Bag/Given   ceFEPIme (MAXIPIME) 2 g in sodium  chloride 0.9 % 100 mL IVPB 2 g 200 mL/hr   12/07/22 2057 New Bag/Given   azithromycin (ZITHROMAX) 500 mg in sodium chloride 0.9 % 250 mL IVPB 500 mg 250 mL/hr       Will continue  on :cefepime/azithro will add vanc   - await results of blood and urine culture  - Rehydrate aggressively  Intravenous fluids were administered,    30cc/kg fluid    9:56 PM   Anemia Obtain anemia panel  Transfuse for Hg <7 , rapidly dropping or  if symptomatic   Hyponatremia In the setting of dehydration Will follow serial BMET  Obtain urine electrolytes    Hypomagnesemia - will replace electrolytes and repeat  check Mg, phos and Ca level and replace as needed Monitor on telemetry   Lab Results  Component Value Date   K 3.8 12/07/2022     Lab Results  Component Value Date   CREATININE 0.45 12/07/2022   Lab Results  Component Value Date   MG 1.3 (L) 12/07/2022   Lab Results  Component Value Date   CALCIUM 7.4 (L) 12/07/2022   PHOS 1.9 (L) 12/07/2022     Lactic acidosis Now improving  CT abd pending to eval for any intraabdominal etiology given   Septic shock initially and suprapubic catheter  NPO until resulted   Other plan as  per orders.  DVT prophylaxis:  SCD      Code Status:  DNR/DNI   as per patient  family  I had personally discussed CODE STATUS with patient and family  ACP  has been reviewed     Family Communication:   Family  at  Bedside  plan of care was discussed   with Husband,   Diet  Diet Orders (From admission, onward)     Start     Ordered   12/07/22 1439  Diet NPO time specified  (Undifferentiated presentation (screening labs and basic nursing orders))  Diet effective now        12/07/22 1438            Disposition Plan:       To home once workup is complete and patient is stable   Following barriers for discharge:                                                          Electrolytes corrected                               Anemia  stable  Pain controlled with PO medications                               Afebrile, white count improving able to transition to PO antibiotics                                   Consult Orders  (From admission, onward)           Start     Ordered   12/07/22 2021  Consult to hospitalist  Once       Provider:  (Not yet assigned)  Question Answer  Comment  Place call to: [facility]   Reason for Consult Admit      12/07/22 2020                               Would benefit from PT/OT eval prior to DC  Ordered                   Swallow eval - SLP ordered                                        Consults called: nephrology is aware  ER spoke to Dr. Valentino Nose   ER touched base with PCCM who rec admit to step donw   Admission status:  ED Disposition     ED Disposition  Admit   Condition  --   Comment  Hospital Area: [facility] [[id number]]  Level of Care: Stepdown [14]  Admit to SDU based on following criteria: Hemodynamic compromise or significant risk of instability:  Patient requiring short term acute titration and management of vasoactive drips, and invasive monitoring (i.e., CVP and Arterial line).  May admit  patient to [facility] or [facility] if equivalent level of care is available:: No  Covid Evaluation: Confirmed COVID Negative  Diagnosis: Sepsis [facility]) [[id number]]  Admitting Physician: Therisa Doyne [[id number]]  Attending Physician: Therisa Doyne [[id number]]  Certification:: I certify this patient will need inpatient services for at least 2 midnights  Expected Medical Readiness: 12/10/2022            inpatient     I Expect 2 midnight stay secondary to severity of patient's current illness need for inpatient interventions justified by the following:  hemodynamic instability despite optimal treatment (tachycardia  hypotension )  Severe lab/radiological/exam abnormalities including:    sepsis and extensive comorbidities including: MS  That are currently affecting medical management.   I expect  patient to be hospitalized for 2 midnights requiring inpatient medical care.  Patient is at high risk for adverse outcome (such as loss of life or disability) if not treated.  Indication for inpatient stay as follows:  Severe change from baseline regarding mental status Hemodynamic instability despite maximal medical therapy,    Need for operative/procedural  intervention     Need for IV antibiotics, IV fluids,     Level of care     progressive     stepdown   tele indefinitely please discontinue once patient no longer qualifies COVID-19 Labs    Lab Results  Component Value Date   SARSCOV2NAA NEGATIVE 12/07/2022     Precautions: admitted as   Covid Negative    Critical   Patient is critically ill due to  hemodynamic instability severe sepsis  They are at high risk for life/limb threatening clinical deterioration requiring frequent reassessment and modifications of care.  Services provided include examination of the patient, review of relevant ancillary tests, prescription of lifesaving therapies, review of medications and prophylactic therapy.  Total critical care time  excluding separately billable procedures: 60  Minutes.    Bradly Sangiovanni 12/08/2022, 12:35 AM    [facility]     after 2 AM please page floor coverage PA If 7AM-7PM, please contact the day team taking care of the patient using [email or web address]

## 2022-12-07 NOTE — Assessment & Plan Note (Signed)
chronic

## 2022-12-08 ENCOUNTER — Inpatient Hospital Stay (HOSPITAL_COMMUNITY): Payer: PPO

## 2022-12-08 ENCOUNTER — Emergency Department (HOSPITAL_COMMUNITY): Payer: PPO

## 2022-12-08 DIAGNOSIS — D529 Folate deficiency anemia, unspecified: Secondary | ICD-10-CM | POA: Diagnosis present

## 2022-12-08 DIAGNOSIS — E861 Hypovolemia: Secondary | ICD-10-CM | POA: Diagnosis present

## 2022-12-08 DIAGNOSIS — D509 Iron deficiency anemia, unspecified: Secondary | ICD-10-CM | POA: Diagnosis present

## 2022-12-08 DIAGNOSIS — G40909 Epilepsy, unspecified, not intractable, without status epilepticus: Secondary | ICD-10-CM | POA: Diagnosis present

## 2022-12-08 DIAGNOSIS — E871 Hypo-osmolality and hyponatremia: Secondary | ICD-10-CM | POA: Diagnosis present

## 2022-12-08 DIAGNOSIS — J189 Pneumonia, unspecified organism: Secondary | ICD-10-CM | POA: Diagnosis present

## 2022-12-08 DIAGNOSIS — E860 Dehydration: Secondary | ICD-10-CM | POA: Diagnosis present

## 2022-12-08 DIAGNOSIS — A419 Sepsis, unspecified organism: Secondary | ICD-10-CM | POA: Diagnosis present

## 2022-12-08 DIAGNOSIS — I9589 Other hypotension: Secondary | ICD-10-CM

## 2022-12-08 DIAGNOSIS — E872 Acidosis, unspecified: Secondary | ICD-10-CM | POA: Diagnosis present

## 2022-12-08 DIAGNOSIS — D638 Anemia in other chronic diseases classified elsewhere: Secondary | ICD-10-CM | POA: Diagnosis present

## 2022-12-08 DIAGNOSIS — Z515 Encounter for palliative care: Secondary | ICD-10-CM | POA: Diagnosis not present

## 2022-12-08 DIAGNOSIS — G9341 Metabolic encephalopathy: Secondary | ICD-10-CM | POA: Diagnosis present

## 2022-12-08 DIAGNOSIS — Z66 Do not resuscitate: Secondary | ICD-10-CM | POA: Diagnosis present

## 2022-12-08 DIAGNOSIS — Z91012 Allergy to eggs: Secondary | ICD-10-CM | POA: Diagnosis not present

## 2022-12-08 DIAGNOSIS — Y846 Urinary catheterization as the cause of abnormal reaction of the patient, or of later complication, without mention of misadventure at the time of the procedure: Secondary | ICD-10-CM | POA: Diagnosis present

## 2022-12-08 DIAGNOSIS — N319 Neuromuscular dysfunction of bladder, unspecified: Secondary | ICD-10-CM | POA: Diagnosis present

## 2022-12-08 DIAGNOSIS — A4151 Sepsis due to Escherichia coli [E. coli]: Secondary | ICD-10-CM | POA: Diagnosis not present

## 2022-12-08 DIAGNOSIS — T83511D Infection and inflammatory reaction due to indwelling urethral catheter, subsequent encounter: Secondary | ICD-10-CM

## 2022-12-08 DIAGNOSIS — G35 Multiple sclerosis: Secondary | ICD-10-CM | POA: Diagnosis present

## 2022-12-08 DIAGNOSIS — Z1152 Encounter for screening for COVID-19: Secondary | ICD-10-CM | POA: Diagnosis not present

## 2022-12-08 DIAGNOSIS — Z91018 Allergy to other foods: Secondary | ICD-10-CM | POA: Diagnosis not present

## 2022-12-08 DIAGNOSIS — Z9359 Other cystostomy status: Secondary | ICD-10-CM | POA: Diagnosis not present

## 2022-12-08 DIAGNOSIS — R6521 Severe sepsis with septic shock: Secondary | ICD-10-CM | POA: Diagnosis present

## 2022-12-08 DIAGNOSIS — Z7189 Other specified counseling: Secondary | ICD-10-CM | POA: Diagnosis not present

## 2022-12-08 DIAGNOSIS — Z885 Allergy status to narcotic agent status: Secondary | ICD-10-CM | POA: Diagnosis not present

## 2022-12-08 DIAGNOSIS — T83518A Infection and inflammatory reaction due to other urinary catheter, initial encounter: Secondary | ICD-10-CM | POA: Diagnosis present

## 2022-12-08 DIAGNOSIS — I10 Essential (primary) hypertension: Secondary | ICD-10-CM | POA: Diagnosis present

## 2022-12-08 LAB — BLOOD CULTURE ID PANEL (REFLEXED) - BCID2
A.calcoaceticus-baumannii: NOT DETECTED
A.calcoaceticus-baumannii: NOT DETECTED
Bacteroides fragilis: NOT DETECTED
Bacteroides fragilis: NOT DETECTED
CTX-M ESBL: NOT DETECTED
Candida albicans: NOT DETECTED
Candida albicans: NOT DETECTED
Candida auris: NOT DETECTED
Candida auris: NOT DETECTED
Candida glabrata: NOT DETECTED
Candida glabrata: NOT DETECTED
Candida krusei: NOT DETECTED
Candida krusei: NOT DETECTED
Candida parapsilosis: NOT DETECTED
Candida parapsilosis: NOT DETECTED
Candida tropicalis: NOT DETECTED
Candida tropicalis: NOT DETECTED
Carbapenem resist OXA 48 LIKE: NOT DETECTED
Carbapenem resistance IMP: NOT DETECTED
Carbapenem resistance KPC: NOT DETECTED
Carbapenem resistance NDM: NOT DETECTED
Carbapenem resistance VIM: NOT DETECTED
Cryptococcus neoformans/gattii: NOT DETECTED
Cryptococcus neoformans/gattii: NOT DETECTED
Enterobacter cloacae complex: NOT DETECTED
Enterobacter cloacae complex: NOT DETECTED
Enterobacterales: DETECTED — AB
Enterobacterales: NOT DETECTED
Enterococcus Faecium: NOT DETECTED
Enterococcus Faecium: NOT DETECTED
Enterococcus faecalis: NOT DETECTED
Enterococcus faecalis: NOT DETECTED
Escherichia coli: DETECTED — AB
Escherichia coli: NOT DETECTED
Haemophilus influenzae: NOT DETECTED
Haemophilus influenzae: NOT DETECTED
Klebsiella aerogenes: NOT DETECTED
Klebsiella aerogenes: NOT DETECTED
Klebsiella oxytoca: NOT DETECTED
Klebsiella oxytoca: NOT DETECTED
Klebsiella pneumoniae: NOT DETECTED
Klebsiella pneumoniae: NOT DETECTED
Listeria monocytogenes: NOT DETECTED
Listeria monocytogenes: NOT DETECTED
Neisseria meningitidis: NOT DETECTED
Neisseria meningitidis: NOT DETECTED
Proteus species: NOT DETECTED
Proteus species: NOT DETECTED
Pseudomonas aeruginosa: NOT DETECTED
Pseudomonas aeruginosa: NOT DETECTED
Salmonella species: NOT DETECTED
Salmonella species: NOT DETECTED
Serratia marcescens: NOT DETECTED
Serratia marcescens: NOT DETECTED
Staphylococcus aureus (BCID): NOT DETECTED
Staphylococcus aureus (BCID): NOT DETECTED
Staphylococcus epidermidis: NOT DETECTED
Staphylococcus epidermidis: NOT DETECTED
Staphylococcus lugdunensis: NOT DETECTED
Staphylococcus lugdunensis: NOT DETECTED
Staphylococcus species: NOT DETECTED
Staphylococcus species: DETECTED — AB
Stenotrophomonas maltophilia: NOT DETECTED
Stenotrophomonas maltophilia: NOT DETECTED
Streptococcus agalactiae: NOT DETECTED
Streptococcus agalactiae: NOT DETECTED
Streptococcus pneumoniae: NOT DETECTED
Streptococcus pneumoniae: NOT DETECTED
Streptococcus pyogenes: NOT DETECTED
Streptococcus pyogenes: NOT DETECTED
Streptococcus species: NOT DETECTED
Streptococcus species: NOT DETECTED

## 2022-12-08 LAB — ECHOCARDIOGRAM COMPLETE
AR max vel: 2.06 cm2
AV Area VTI: 1.89 cm2
AV Area mean vel: 1.91 cm2
AV Mean grad: 4 mm[Hg]
AV Peak grad: 6.9 mm[Hg]
Ao pk vel: 1.31 m/s
Area-P 1/2: 1.86 cm2
Height: 65.5 in
S' Lateral: 2.6 cm
Weight: 2160 [oz_av]

## 2022-12-08 LAB — URINE CULTURE

## 2022-12-08 LAB — HEPATIC FUNCTION PANEL
ALT: 10 U/L (ref 0–44)
AST: 13 U/L — ABNORMAL LOW (ref 15–41)
Albumin: 2.6 g/dL — ABNORMAL LOW (ref 3.5–5.0)
Alkaline Phosphatase: 52 U/L (ref 38–126)
Bilirubin, Direct: <0.1 mg/dL (ref 0.0–0.2)
Total Bilirubin: 0.3 mg/dL (ref 0.3–1.2)
Total Protein: 4.8 g/dL — ABNORMAL LOW (ref 6.5–8.1)

## 2022-12-08 LAB — CBC
HCT: 29.3 % — ABNORMAL LOW (ref 36.0–46.0)
Hemoglobin: 9.5 g/dL — ABNORMAL LOW (ref 12.0–15.0)
MCH: 28.6 pg (ref 26.0–34.0)
MCHC: 32.4 g/dL (ref 30.0–36.0)
MCV: 88.3 fL (ref 80.0–100.0)
Platelets: 205 10*3/uL (ref 150–400)
RBC: 3.32 MIL/uL — ABNORMAL LOW (ref 3.87–5.11)
RDW: 13.9 % (ref 11.5–15.5)
WBC: 14.1 10*3/uL — ABNORMAL HIGH (ref 4.0–10.5)
nRBC: 0 % (ref 0.0–0.2)

## 2022-12-08 LAB — BASIC METABOLIC PANEL
Anion gap: 7 (ref 5–15)
Anion gap: 10 (ref 5–15)
Anion gap: 8 (ref 5–15)
Anion gap: 8 (ref 5–15)
BUN: 11 mg/dL (ref 8–23)
BUN: 9 mg/dL (ref 8–23)
BUN: 9 mg/dL (ref 8–23)
BUN: 8 mg/dL (ref 8–23)
CO2: 20 mmol/L — ABNORMAL LOW (ref 22–32)
CO2: 17 mmol/L — ABNORMAL LOW (ref 22–32)
CO2: 20 mmol/L — ABNORMAL LOW (ref 22–32)
CO2: 17 mmol/L — ABNORMAL LOW (ref 22–32)
Calcium: 7.6 mg/dL — ABNORMAL LOW (ref 8.9–10.3)
Calcium: 7.5 mg/dL — ABNORMAL LOW (ref 8.9–10.3)
Calcium: 7.8 mg/dL — ABNORMAL LOW (ref 8.9–10.3)
Calcium: 7.3 mg/dL — ABNORMAL LOW (ref 8.9–10.3)
Chloride: 97 mmol/L — ABNORMAL LOW (ref 98–111)
Chloride: 101 mmol/L (ref 98–111)
Chloride: 103 mmol/L (ref 98–111)
Chloride: 104 mmol/L (ref 98–111)
Creatinine, Ser: 0.57 mg/dL (ref 0.44–1.00)
Creatinine, Ser: 0.46 mg/dL (ref 0.44–1.00)
Creatinine, Ser: 0.43 mg/dL — ABNORMAL LOW (ref 0.44–1.00)
Creatinine, Ser: 0.46 mg/dL (ref 0.44–1.00)
GFR, Estimated: >60 mL/min (ref >60)
GFR, Estimated: >60 mL/min (ref >60)
GFR, Estimated: >60 mL/min (ref >60)
GFR, Estimated: >60 mL/min (ref >60)
Glucose, Bld: 137 mg/dL — ABNORMAL HIGH (ref 70–99)
Glucose, Bld: 105 mg/dL — ABNORMAL HIGH (ref 70–99)
Glucose, Bld: 132 mg/dL — ABNORMAL HIGH (ref 70–99)
Glucose, Bld: 127 mg/dL — ABNORMAL HIGH (ref 70–99)
Potassium: 4 mmol/L (ref 3.5–5.1)
Potassium: 4.2 mmol/L (ref 3.5–5.1)
Potassium: 4 mmol/L (ref 3.5–5.1)
Potassium: 4 mmol/L (ref 3.5–5.1)
Sodium: 124 mmol/L — ABNORMAL LOW (ref 135–145)
Sodium: 128 mmol/L — ABNORMAL LOW (ref 135–145)
Sodium: 131 mmol/L — ABNORMAL LOW (ref 135–145)
Sodium: 129 mmol/L — ABNORMAL LOW (ref 135–145)

## 2022-12-08 LAB — MRSA NEXT GEN BY PCR, NASAL: MRSA by PCR Next Gen: NOT DETECTED

## 2022-12-08 LAB — OSMOLALITY: Osmolality: 261 mosm/kg — ABNORMAL LOW (ref 275–295)

## 2022-12-08 LAB — MAGNESIUM: Magnesium: 2.2 mg/dL (ref 1.7–2.4)

## 2022-12-08 LAB — LACTIC ACID, PLASMA: Lactic Acid, Venous: 1.3 mmol/L (ref 0.5–1.9)

## 2022-12-08 LAB — PREALBUMIN: Prealbumin: <5 mg/dL — ABNORMAL LOW (ref 18–38)

## 2022-12-08 LAB — OSMOLALITY, URINE: Osmolality, Ur: 290 mosm/kg — ABNORMAL LOW (ref 300–900)

## 2022-12-08 LAB — PHOSPHORUS: Phosphorus: 3.2 mg/dL (ref 2.5–4.6)

## 2022-12-08 LAB — CORTISOL-AM, BLOOD: Cortisol - AM: 11.2 ug/dL (ref 6.7–22.6)

## 2022-12-08 LAB — VITAMIN B12: Vitamin B-12: 588 pg/mL (ref 180–914)

## 2022-12-08 LAB — FOLATE: Folate: 2.4 ng/mL — ABNORMAL LOW (ref >5.9)

## 2022-12-08 MED ORDER — FOLIC ACID 1 MG PO TABS
1.0000 mg | ORAL_TABLET | Freq: Every day | ORAL | Status: DC
  Administered 2022-12-08 – 2022-12-11 (×4): 1 mg via ORAL
  Filled 2022-12-08 (×4): qty 1

## 2022-12-08 MED ORDER — FOLIC ACID 5 MG/ML IJ SOLN: 1.0000 mg | Freq: Every day | INTRAMUSCULAR | Status: DC

## 2022-12-08 MED ORDER — BUSPIRONE HCL 5 MG PO TABS
15.0000 mg | ORAL_TABLET | Freq: Two times a day (BID) | ORAL | Status: DC
  Administered 2022-12-08 – 2022-12-11 (×8): 15 mg via ORAL
  Filled 2022-12-08 (×8): qty 1

## 2022-12-08 MED ORDER — LAMOTRIGINE 25 MG PO TABS
25.0000 mg | ORAL_TABLET | Freq: Two times a day (BID) | ORAL | Status: DC
  Administered 2022-12-08 – 2022-12-11 (×8): 25 mg via ORAL
  Filled 2022-12-08 (×8): qty 1

## 2022-12-08 MED ORDER — CHLORHEXIDINE GLUCONATE CLOTH 2 % EX PADS
6.0000 | MEDICATED_PAD | Freq: Every day | CUTANEOUS | Status: DC
  Administered 2022-12-09: 6 via TOPICAL

## 2022-12-08 MED ORDER — ONDANSETRON HCL 4 MG PO TABS: 4.0000 mg | ORAL_TABLET | Freq: Four times a day (QID) | ORAL | Status: DC | PRN

## 2022-12-08 MED ORDER — ADULT MULTIVITAMIN W/MINERALS CH
1.0000 | ORAL_TABLET | Freq: Every day | ORAL | Status: DC
  Administered 2022-12-09 – 2022-12-11 (×3): 1 via ORAL
  Filled 2022-12-08 (×3): qty 1

## 2022-12-08 MED ORDER — CHOLESTYRAMINE LIGHT 4 G PO PACK
4.0000 g | PACK | Freq: Four times a day (QID) | ORAL | Status: DC
Start: 2022-12-08 — End: 2022-12-11
  Administered 2022-12-08 – 2022-12-11 (×11): 4 g via ORAL
  Filled 2022-12-08 (×14): qty 1

## 2022-12-08 MED ORDER — ACETAMINOPHEN 325 MG PO TABS
650.0000 mg | ORAL_TABLET | Freq: Four times a day (QID) | ORAL | Status: DC | PRN
  Administered 2022-12-08: 650 mg via ORAL
  Filled 2022-12-08: qty 2

## 2022-12-08 MED ORDER — ONDANSETRON HCL 4 MG/2ML IJ SOLN: 4.0000 mg | Freq: Four times a day (QID) | INTRAMUSCULAR | Status: DC | PRN

## 2022-12-08 MED ORDER — PROPRANOLOL HCL 20 MG PO TABS
40.0000 mg | ORAL_TABLET | Freq: Two times a day (BID) | ORAL | Status: DC
  Administered 2022-12-08 – 2022-12-11 (×6): 40 mg via ORAL
  Filled 2022-12-08 (×6): qty 2

## 2022-12-08 MED ORDER — VENLAFAXINE HCL 50 MG PO TABS
100.0000 mg | ORAL_TABLET | Freq: Three times a day (TID) | ORAL | Status: DC
  Administered 2022-12-08 – 2022-12-11 (×9): 100 mg via ORAL
  Filled 2022-12-08 (×11): qty 2

## 2022-12-08 MED ORDER — ACETAMINOPHEN 650 MG RE SUPP: 650.0000 mg | Freq: Four times a day (QID) | RECTAL | Status: DC | PRN

## 2022-12-08 MED ORDER — QUETIAPINE FUMARATE 25 MG PO TABS
50.0000 mg | ORAL_TABLET | Freq: Every day | ORAL | Status: DC
  Administered 2022-12-08 – 2022-12-10 (×4): 50 mg via ORAL
  Filled 2022-12-08: qty 2
  Filled 2022-12-08 (×3): qty 1

## 2022-12-08 MED ORDER — FOOD THICKENER (SIMPLYTHICK): 1.0000 | ORAL | Status: DC | PRN

## 2022-12-08 MED ORDER — FLUTICASONE PROPIONATE 50 MCG/ACT NA SUSP
1.0000 | Freq: Every day | NASAL | Status: DC
  Administered 2022-12-08 – 2022-12-11 (×4): 1 via NASAL
  Filled 2022-12-08: qty 16

## 2022-12-08 MED ORDER — LORAZEPAM 0.5 MG PO TABS: 0.5000 mg | ORAL_TABLET | Freq: Two times a day (BID) | ORAL | Status: DC | PRN

## 2022-12-08 MED ORDER — SODIUM CHLORIDE 0.9 % IV SOLN
2.0000 g | INTRAVENOUS | Status: DC
Start: 2022-12-08 — End: 2022-12-10
  Administered 2022-12-08 – 2022-12-09 (×2): 2 g via INTRAVENOUS
  Filled 2022-12-08 (×2): qty 20

## 2022-12-08 MED ORDER — SODIUM CHLORIDE 0.9 % IV SOLN: INTRAVENOUS | Status: DC

## 2022-12-08 MED ORDER — KATE FARMS STANDARD 1.4 PO LIQD
325.0000 mL | Freq: Two times a day (BID) | ORAL | Status: DC
  Administered 2022-12-08 – 2022-12-11 (×5): 325 mL via ORAL
  Filled 2022-12-08 (×7): qty 325

## 2022-12-08 MED ORDER — IOHEXOL 300 MG/ML  SOLN
100.0000 mL | Freq: Once | INTRAMUSCULAR | Status: AC | PRN
  Administered 2022-12-08: 100 mL via INTRAVENOUS

## 2022-12-08 NOTE — Progress Notes (Signed)
RT gave pt flutter valve per MD order. Pt has very poor effort at doing.

## 2022-12-08 NOTE — Assessment & Plan Note (Signed)
-   will replace electrolytes and repeat  check Mg, phos and Ca level and replace as needed Monitor on telemetry   Lab Results  Component Value Date   K 3.8 12/07/2022     Lab Results  Component Value Date   CREATININE 0.45 12/07/2022   Lab Results  Component Value Date   MG 1.3 (L) 12/07/2022   Lab Results  Component Value Date   CALCIUM 7.4 (L) 12/07/2022   PHOS 1.9 (L) 12/07/2022

## 2022-12-08 NOTE — Progress Notes (Signed)
PHARMACY - PHYSICIAN COMMUNICATION CRITICAL VALUE ALERT - BLOOD CULTURE IDENTIFICATION (BCID)  Kelly Cole is an 82 y.o. female who presented to [facility] on 12/07/2022 with a chief complaint of hypotension, lethargy. Patient had a previous BCID call from earlier today showing 1/4 bottles with E coli (no resistance detected).   Assessment: 1/4 bottles (aerobic bottle) now growing GPC, BCID identifying as staph species   Name of physician (or Provider) Contacted: Dr. Blake Divine  Current antibiotics: ceftriaxone and azithromycin   Changes to prescribed antibiotics recommended:  Likely a contaminant--would continue current antibiotics  Results for orders placed or performed during the hospital encounter of 12/07/22  Blood Culture ID Panel (Reflexed) (Collected: 12/07/2022  4:01 PM)  Result Value Ref Range   Enterococcus faecalis NOT DETECTED NOT DETECTED   Enterococcus Faecium NOT DETECTED NOT DETECTED   Listeria monocytogenes NOT DETECTED NOT DETECTED   Staphylococcus species NOT DETECTED NOT DETECTED   Staphylococcus aureus (BCID) NOT DETECTED NOT DETECTED   Staphylococcus epidermidis NOT DETECTED NOT DETECTED   Staphylococcus lugdunensis NOT DETECTED NOT DETECTED   Streptococcus species NOT DETECTED NOT DETECTED   Streptococcus agalactiae NOT DETECTED NOT DETECTED   Streptococcus pneumoniae NOT DETECTED NOT DETECTED   Streptococcus pyogenes NOT DETECTED NOT DETECTED   A.calcoaceticus-baumannii NOT DETECTED NOT DETECTED   Bacteroides fragilis NOT DETECTED NOT DETECTED   Enterobacterales DETECTED (A) NOT DETECTED   Enterobacter cloacae complex NOT DETECTED NOT DETECTED   Escherichia coli DETECTED (A) NOT DETECTED   Klebsiella aerogenes NOT DETECTED NOT DETECTED   Klebsiella oxytoca NOT DETECTED NOT DETECTED   Klebsiella pneumoniae NOT DETECTED NOT DETECTED   Proteus species NOT DETECTED NOT DETECTED   Salmonella species NOT DETECTED NOT DETECTED   Serratia marcescens NOT DETECTED  NOT DETECTED   Haemophilus influenzae NOT DETECTED NOT DETECTED   Neisseria meningitidis NOT DETECTED NOT DETECTED   Pseudomonas aeruginosa NOT DETECTED NOT DETECTED   Stenotrophomonas maltophilia NOT DETECTED NOT DETECTED   Candida albicans NOT DETECTED NOT DETECTED   Candida auris NOT DETECTED NOT DETECTED   Candida glabrata NOT DETECTED NOT DETECTED   Candida krusei NOT DETECTED NOT DETECTED   Candida parapsilosis NOT DETECTED NOT DETECTED   Candida tropicalis NOT DETECTED NOT DETECTED   Cryptococcus neoformans/gattii NOT DETECTED NOT DETECTED   CTX-M ESBL NOT DETECTED NOT DETECTED   Carbapenem resistance IMP NOT DETECTED NOT DETECTED   Carbapenem resistance KPC NOT DETECTED NOT DETECTED   Carbapenem resistance NDM NOT DETECTED NOT DETECTED   Carbapenem resist OXA 48 LIKE NOT DETECTED NOT DETECTED   Carbapenem resistance VIM NOT DETECTED NOT DETECTED    Cherylin Mylar, PharmD Clinical Pharmacist  10/30/20244:43 PM

## 2022-12-08 NOTE — Progress Notes (Signed)
[facility]                                                                               Kelly Cole, is a 82 y.o. female, DOB - Jun 07, 1940, UVO:[id number] Admit date - 12/07/2022    Outpatient Primary MD for the patient is Kelly Laughter, MD (Inactive)  LOS - 0  days    Brief summary   Kelly Cole is a 82 y.o. female with medical history significant of MS, HTN, neurogenic bladder status post suprapubic catheter , history of seizure  presented to ED for acute metabolic encephalopathy.  Assessment & Plan    Assessment and Plan:  Severe sepsis:  On arrival to ED, she was afebrile, tachycardic 111/min, tachypnea 29/min, and hypotensive 86/22 mmhg, with leukocytosis of 17,000, lactic acid level of 4. Altered mentation on admission, was found to have UTI with indwelling supra pubic catheter, and Pneumonia with gram negative bacteremia.  Started her on IV ceftriaxone and IV Azithromycin.  Leukocytosis improving.  Blood cultures showing gram negative bacteria.  Lactic acid level improved.  Influenza A, B, COVID PCR is negative. RSV is negative.  Pro calcitonin is negative.      Acute metabolic encephalopathy from UTI and pneumonia.  Improving. She is alert and oriented to person and place.  SLP evaluation recommending dysphagia 3 diet.    Hypomagnesemia and hypophosphatemia:  Replaced and repeat level wnl.    Hyponatremia:  Probably from hypovolemic and dehydration.  Sodium on admission 119  improved to 128 with IV fluids.     Vascular congestion on CXR Check ECHO.    Anemia of chronic disease in addition to iron deficiency and folate deficiency anemia.  Iron and folate supplementation added.   Hypertension Blood pressure parameters have improved Continue to monitor   History of multiple sclerosis Resume home meds.       Estimated body mass index is 22.12 kg/m as calculated from the following:   Height as of this encounter: 5' 5.5"  (1.664 m).   Weight as of this encounter: 61.2 kg.  Code Status: DNR Limited.  DVT Prophylaxis:  SCDs Start: 12/08/22 0100   Level of Care: Level of care: Stepdown Family Communication: None at bedside.   Disposition Plan:     Remains inpatient appropriate:  IV antibiotics.  Procedures:  None.   Consultants:   None.   Antimicrobials:   Anti-infectives (From admission, onward)    Start     Dose/Rate Route Frequency Ordered Stop   12/09/22 1000  vancomycin (VANCOREADY) IVPB 1250 mg/250 mL        1,250 mg 166.7 mL/hr over 90 Minutes Intravenous Every 36 hours 12/07/22 2350     12/08/22 1800  azithromycin (ZITHROMAX) 500 mg in sodium chloride 0.9 % 250 mL IVPB        500 mg 250 mL/hr over 60 Minutes Intravenous Every 24 hours 12/07/22 2052     12/08/22 0400  ceFEPIme (MAXIPIME) 2 g in sodium chloride 0.9 % 100 mL IVPB        2 g 200 mL/hr over 30 Minutes Intravenous Every 12 hours 12/07/22 2224     12/08/22 0000  ceFEPIme (MAXIPIME) 2 g in sodium chloride 0.9 % 100 mL IVPB  Status:  Discontinued        2 g 200 mL/hr over 30 Minutes Intravenous Every 8 hours 12/07/22 2125 12/07/22 2224   12/07/22 2215  vancomycin (VANCOCIN) IVPB 1000 mg/200 mL premix        1,000 mg 200 mL/hr over 60 Minutes Intravenous  Once 12/07/22 2209 12/07/22 2352   12/07/22 2030  azithromycin (ZITHROMAX) 500 mg in sodium chloride 0.9 % 250 mL IVPB        500 mg 250 mL/hr over 60 Minutes Intravenous  Once 12/07/22 2019 12/07/22 2259   12/07/22 1600  ceFEPIme (MAXIPIME) 2 g in sodium chloride 0.9 % 100 mL IVPB        2 g 200 mL/hr over 30 Minutes Intravenous  Once 12/07/22 1549 12/07/22 1653        Medications  Scheduled Meds:  busPIRone  15 mg Oral BID   Chlorhexidine Gluconate Cloth  6 each Topical Daily   folic acid  1 mg Intravenous Daily   lamoTRIgine  25 mg Oral BID   QUEtiapine  50 mg Oral QHS   Continuous Infusions:  sodium chloride Stopped (12/08/22 0352)   azithromycin      ceFEPime (MAXIPIME) IV Stopped (12/08/22 0501)   [START ON 12/09/2022] vancomycin     PRN Meds:.acetaminophen **OR** acetaminophen, ondansetron **OR** ondansetron (ZOFRAN) IV    Subjective:   Kelly Cole was seen and examined today.  Wants to talk to her husband.   Objective:   Vitals:   12/08/22 0330 12/08/22 0400 12/08/22 0500 12/08/22 0600  BP: (!) 100/29 (!) 136/59 (!) 128/29 (!) 123/35  Pulse: 92 94 95   Resp: 17 19 (!) 24 (!) 21  Temp:  (!) 97.5 F (36.4 C)    TempSrc:  Oral    SpO2: 96% 98% 95%   Weight:      Height:        Intake/Output Summary (Last 24 hours) at 12/08/2022 0816 Last data filed at 12/08/2022 0600 Gross per 24 hour  Intake 2776.86 ml  Output --  Net 2776.86 ml   Filed Weights   12/07/22 1428  Weight: 61.2 kg     Exam General exam: Ill-appearing elderly frail lady does not appear to be in any distress Respiratory system: Diminished air entry at bases on room air Cardiovascular system: S1 & S2 heard, RRR. Gastrointestinal system: Abdomen is nondistended, soft and nontender.  Central nervous system: Alert and oriented to person.  Extremities: no edema.  Skin: No rashes,  Psychiatry: anxious.     Data Reviewed:  I have personally reviewed following labs and imaging studies   CBC Lab Results  Component Value Date   WBC 14.1 (H) 12/08/2022   RBC 3.32 (L) 12/08/2022   HGB 9.5 (L) 12/08/2022   HCT 29.3 (L) 12/08/2022   MCV 88.3 12/08/2022   MCH 28.6 12/08/2022   PLT 205 12/08/2022   MCHC 32.4 12/08/2022   RDW 13.9 12/08/2022   LYMPHSABS 0.7 12/07/2022   MONOABS 1.0 12/07/2022   EOSABS 0.1 12/07/2022   BASOSABS 0.0 12/07/2022     Last metabolic panel Lab Results  Component Value Date   NA 124 (L) 12/08/2022   K 4.0 12/08/2022   CL 97 (L) 12/08/2022   CO2 20 (L) 12/08/2022   BUN 11 12/08/2022   CREATININE 0.57 12/08/2022   GLUCOSE 137 (H) 12/08/2022   GFRNONAA >60 12/08/2022   GFRAA 52 (  L) 07/13/2019   CALCIUM 7.6 (L)  12/08/2022   PHOS 3.2 12/08/2022   PROT 4.8 (L) 12/08/2022   ALBUMIN 2.6 (L) 12/08/2022   BILITOT 0.3 12/08/2022   ALKPHOS 52 12/08/2022   AST 13 (L) 12/08/2022   ALT 10 12/08/2022   ANIONGAP 7 12/08/2022    CBG (last 3)  No results for input(s): "GLUCAP" in the last 72 hours.    Coagulation Profile: Recent Labs  Lab 12/07/22 1730  INR 1.0     Radiology Studies: CT ABDOMEN PELVIS W CONTRAST  Result Date: 12/08/2022 CLINICAL DATA:  Abdominal pain. EXAM: CT ABDOMEN AND PELVIS WITH CONTRAST TECHNIQUE: Multidetector CT imaging of the abdomen and pelvis was performed using the standard protocol following bolus administration of intravenous contrast. RADIATION DOSE REDUCTION: This exam was performed according to the departmental dose-optimization program which includes automated exposure control, adjustment of the mA and/or kV according to patient size and/or use of iterative reconstruction technique. CONTRAST:  OMNIPAQUE IOHEXOL 300 MG/ML  SOLN COMPARISON:  Portable chest yesterday, and CT of the abdomen and pelvis without contrast 04/09/2020 FINDINGS: Lower chest: There is mild cardiomegaly. Two-vessel calcifications in the LAD and right coronary arteries and a small but increased pericardial effusion are noted. Moderate-sized right and small left layering pleural effusions are present with adjacent consolidation or atelectasis in the lower lobes. There is increased linear atelectasis in the lingular and right middle lobe bases, otherwise, remainder of the more anterior lung bases are otherwise clear. Hepatobiliary: Stable degree of post cholecystectomy common bile duct prominence measuring 9 mm with mild intrahepatic biliary prominence. No ductal filling defect is evident allowing for spray artifact from the cholecystectomy clips. There is an 8 mm too small to characterize hypodensity in the hepatic dome in segment 8, which was seen previously and is probably a small cyst. Remainder of  the liver is homogeneous without further focal abnormality. The portal vein is patent and normal in caliber. Pancreas: Diffusely atrophic. No mass enhancement. No ductal dilatation or inflammatory change. Spleen: No abnormality Adrenals/Urinary Tract: There is no adrenal mass. Left kidney is homogeneous. 3 mm calyceal stone noted in the left upper pole, 5 mm stone and 3 mm stone noted in the right upper and lower pole with punctate nonobstructive stones elsewhere in the right upper pole. There is no hydronephrosis, but there is a 3 mm stone lodged in the inner orifice of the right UVJ, and there is mild urothelial thickening of the right renal pelvis, as well as right renal edema with patchy hypoenhancement consistent with pyelonephritis. Slight asymmetric right perinephric edema also noted. There is a suprapubic bladder catheter in place in the bladder. The bladder is contracted. Perivesical stranding edema suspected along side the bladder compatible with cystitis. Stomach/Bowel: The distal stomach and descending duodenum migrated into the right upper abdominal quadrant due to a prior right hemicolectomy to the mid transverse segment. This was seen previously. There is a patent primary surgical ileocolic anastomosis. Chronic thickened folds again noted in the gastric antrum. The small bowel is normal caliber. The portion of the colon not removed is unremarkable apart from mild fecal stasis and uncomplicated scattered diverticula. Vascular/Lymphatic: Aortic atherosclerosis. No enlarged abdominal or pelvic lymph nodes. Multiple pelvic phleboliths. Reproductive: Status post hysterectomy. No adnexal masses. Other: Minimal nonlocalizing free fluid noted in the right pararenal space, right pericolic gutter and dorsal deep pelvis. No drainable pocket. There is no free air, free hemorrhage or localized collection. Musculoskeletal: Moderate to severe dextroscoliosis with  advanced degenerative change lumbar spine. Multilevel  severe acquired lumbar spinal stenosis. Advanced right hip DJD. No acute or other significant osseous findings. IMPRESSION: 1. 3 mm stone lodged in the inner orifice of the right UVJ without hydronephrosis, but with right renal edema and patchy hypoenhancement consistent with pyelonephritis. 2. Bilateral nephrolithiasis. 3. Suprapubic bladder catheter in place with contracted bladder and perivesical stranding edema compatible with cystitis. 4. Minimal nonlocalizing free fluid in the right pararenal space, right pericolic gutter and dorsal deep pelvis. 5. Moderate-sized right and small left layering pleural effusions with adjacent consolidation or atelectasis in the lower lobes. 6. Cardiomegaly with small but increased pericardial effusion. 7. Aortic and coronary artery atherosclerosis. 8. 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 Old right hemicolectomy. 9. Constipation and diverticulosis. 10. Dextroscoliosis with advanced degenerative change in the lumbar spine and right hip. Multilevel severe acquired lumbar spinal stenosis. Aortic Atherosclerosis (ICD10-I70.0). Electronically Signed   By: Almira Bar M.D.   On: 12/08/2022 04:17   DG Chest Portable 1 View  Result Date: 12/07/2022 CLINICAL DATA:  Cough EXAM: PORTABLE CHEST 1 VIEW COMPARISON:  Chest x-ray 09/25/2020 FINDINGS: There is a small right pleural effusion. There some patchy opacities in both lung bases. There central pulmonary vascular congestion and cardiomegaly. There is no pneumothorax. There are calcified pleural plaques in both lung apices. There are healed left-sided rib fractures. No acute fractures are seen. IMPRESSION: 1. Small right pleural effusion. 2. Patchy opacities in both lung bases may represent atelectasis or infection. 3. Central pulmonary vascular congestion and cardiomegaly. Electronically Signed   By: Darliss Cheney M.D.   On: 12/07/2022 21:57       Kathlen Mody M.D. [facility] 12/08/2022, 8:16  AM  Available via Epic secure chat 7am-7pm After 7 pm, please refer to night coverage provider listed on amion.

## 2022-12-08 NOTE — Plan of Care (Signed)
  Problem: Fluid Volume: Goal: Hemodynamic stability will improve Outcome: Progressing   Problem: Clinical Measurements: Goal: Diagnostic test results will improve Outcome: Progressing Goal: Signs and symptoms of infection will decrease Outcome: Progressing   Problem: Respiratory: Goal: Ability to maintain adequate ventilation will improve Outcome: Progressing   Problem: Education: Goal: Knowledge of General Education information will improve Description: Including pain rating scale, medication(s)/side effects and non-pharmacologic comfort measures Outcome: Progressing   Problem: Health Behavior/Discharge Planning: Goal: Ability to manage health-related needs will improve Outcome: Progressing   Problem: Clinical Measurements: Goal: Ability to maintain clinical measurements within normal limits will improve Outcome: Progressing

## 2022-12-08 NOTE — Progress Notes (Signed)
PHARMACY - PHYSICIAN COMMUNICATION CRITICAL VALUE ALERT - BLOOD CULTURE IDENTIFICATION (BCID)  Kelly Cole is an 82 y.o. female who presented to [facility] on 12/07/2022 with a chief complaint of hypotension, lethargy  Assessment: Pt initiated on antibiotics for sepsis - suspected source likely UTI vs CAP.  MRSA PCR negative BCID + 1/4 E.coli (no resistance detected)  Name of physician (or Provider) Contacted: Dr. Blake Divine  Current antibiotics: Vancomycin + cefepime + azithromycin  Changes to prescribed antibiotics recommended: Narrow to ceftriaxone 2 g IV daily.  MD completing 3 days azithromycin to cover for CAP.  Results for orders placed or performed during the hospital encounter of 12/07/22  Blood Culture ID Panel (Reflexed) (Collected: 12/07/2022  4:01 PM)  Result Value Ref Range   Enterococcus faecalis NOT DETECTED NOT DETECTED   Enterococcus Faecium NOT DETECTED NOT DETECTED   Listeria monocytogenes NOT DETECTED NOT DETECTED   Staphylococcus species NOT DETECTED NOT DETECTED   Staphylococcus aureus (BCID) NOT DETECTED NOT DETECTED   Staphylococcus epidermidis NOT DETECTED NOT DETECTED   Staphylococcus lugdunensis NOT DETECTED NOT DETECTED   Streptococcus species NOT DETECTED NOT DETECTED   Streptococcus agalactiae NOT DETECTED NOT DETECTED   Streptococcus pneumoniae NOT DETECTED NOT DETECTED   Streptococcus pyogenes NOT DETECTED NOT DETECTED   A.calcoaceticus-baumannii NOT DETECTED NOT DETECTED   Bacteroides fragilis NOT DETECTED NOT DETECTED   Enterobacterales DETECTED (A) NOT DETECTED   Enterobacter cloacae complex NOT DETECTED NOT DETECTED   Escherichia coli DETECTED (A) NOT DETECTED   Klebsiella aerogenes NOT DETECTED NOT DETECTED   Klebsiella oxytoca NOT DETECTED NOT DETECTED   Klebsiella pneumoniae NOT DETECTED NOT DETECTED   Proteus species NOT DETECTED NOT DETECTED   Salmonella species NOT DETECTED NOT DETECTED   Serratia marcescens NOT DETECTED NOT DETECTED    Haemophilus influenzae NOT DETECTED NOT DETECTED   Neisseria meningitidis NOT DETECTED NOT DETECTED   Pseudomonas aeruginosa NOT DETECTED NOT DETECTED   Stenotrophomonas maltophilia NOT DETECTED NOT DETECTED   Candida albicans NOT DETECTED NOT DETECTED   Candida auris NOT DETECTED NOT DETECTED   Candida glabrata NOT DETECTED NOT DETECTED   Candida krusei NOT DETECTED NOT DETECTED   Candida parapsilosis NOT DETECTED NOT DETECTED   Candida tropicalis NOT DETECTED NOT DETECTED   Cryptococcus neoformans/gattii NOT DETECTED NOT DETECTED   CTX-M ESBL NOT DETECTED NOT DETECTED   Carbapenem resistance IMP NOT DETECTED NOT DETECTED   Carbapenem resistance KPC NOT DETECTED NOT DETECTED   Carbapenem resistance NDM NOT DETECTED NOT DETECTED   Carbapenem resist OXA 48 LIKE NOT DETECTED NOT DETECTED   Carbapenem resistance VIM NOT DETECTED NOT DETECTED    Cindi Carbon, PharmD 12/08/2022  10:23 AM

## 2022-12-08 NOTE — Progress Notes (Signed)
Speech Language Pathology Treatment: Dysphagia  Patient Details Name: Kelly Cole MRN: [id number] DOB: 24-Sep-1940 Today's Date: 12/08/2022 Time: 8841-6606 SLP Time Calculation (min) (ACUTE ONLY): 29 min  Assessment / Plan / Recommendation Clinical Impression  After session, pt's spouse, [name], arrived and provided SLP with further information regarding pt's swallowing. He advised that they are using nectar thick liquids at home over the last month and pt has seen a HH SLP once due to worsening dysphagia. SLP reviewed prior MBS studies with pt and her spouse using teach back and advised to consider repeat MBS while pt is here in hospital. [name] reports concern for pt possibly having esophageal spasm - Pt denies having discomfort - - noted her having frequent eructation - which spouse states has been ongoing. Pt takes her medications with nectar thick liquids - SLP inquired it pt is staying hydrated on nectar liquids - to which she advised - yes. SLP recommends to proceed with MBS next date; reincorporate nectar thick liquids - except between meals allow thin juices *slightly thicker. Advised will conduct esophageal sweep, but can not diagnose esophageal deficits - pt has known h/o prominent CP that did not impair barium flow. Reviewed precautions with pt/family and posted swallow precaution signs using teach back. MBS ordered for next date am with this therapist. Pt and spouse agreeable to plan.     HPI HPI: pt is an 82 yo female adm to [facility] with hypotension and weakness - diagnosed with UTI, pna, and  sepsis,  Pt with PMH + for MS, neurogenic bladder s/p supra pubic cathether, tremor, seizures, dysphagia with silent aspiration, h/o falls, pna, resides with her spouse, [name].  She has a caregiver at home.  Swallow eval ordered.  CXR concerning for potential pna.      SLP Plan  Continue with current plan of care;MBS      Recommendations for follow up therapy are one component of a multi-disciplinary  discharge planning process, led by the attending physician.  Recommendations may be updated based on patient status, additional functional criteria and insurance authorization.    Recommendations  Diet recommendations: Dysphagia 3 (mechanical soft);Nectar-thick liquid (thin juice ok between meals) Liquids provided via: Cup;Straw Medication Administration: Other (Comment) (whole with nectar) Supervision: Staff to assist with self feeding Compensations: Slow rate;Small sips/bites;Follow solids with liquid (allow pt time to conduct multiple swallows with liquids) Postural Changes and/or Swallow Maneuvers: Seated upright 90 degrees;Upright 30-60 min after meal                  Oral care QID   None Dysphagia, oropharyngeal phase (R13.12)     Continue with current plan of care;MBS   [name], MS [facility] SLP Acute Rehab Services Office [phone]   [name]  12/08/2022, 12:13 PM

## 2022-12-08 NOTE — Evaluation (Signed)
Clinical/Bedside Swallow Evaluation Patient Details  Name: Kelly Cole MRN: [id number] Date of Birth: [birth year]  Today's Date: 12/08/2022 Time: SLP Start Time (ACUTE ONLY): 1005 SLP Stop Time (ACUTE ONLY): 1030 SLP Time Calculation (min) (ACUTE ONLY): 25 min  Past Medical History:  Past Medical History:  Diagnosis Date   HTN (hypertension)    Multiple sclerosis (HCC)    Past Surgical History:  Past Surgical History:  Procedure Laterality Date   ABDOMINAL HYSTERECTOMY     APPENDECTOMY     CHOLECYSTECTOMY     HYSTERCTOMY     HPI:  pt is an 82 yo female adm to [facility] [employer] with hypotension and weakness - diagnosed with UTI, pna, and  sepsis,  Pt with PMH + for MS, neurogenic bladder s/p supra pubic cathether, tremor, seizures, dysphagia with silent aspiration, h/o falls, pna, resides with her spouse, Kelly Cole.  She has a caregiver at home.  Swallow eval ordered.  CXR concerning for potential pna.    Assessment / Plan / Recommendation  Clinical Impression  Pt known to this SLP from prior MBS study as an OP in 12/2020 where she had trace silent aspiration of thin liquid x1 just below cords that cleared with cued cough. Today pt alert and able to feed herself with assist*  has tremor.  No focal CN deficits but voice is mildly weak as well as cough.  Reflexive cough x1 with large sequential sips of thin liquids *water* noted but no clinical indication of aspiration with thin small boluses, plain apple juice (slightly thicker), applesauce nor crackers. She clinically presents with delayed multiple swallows.  At this time; recommend dys3/thin liquids with general precautions. SLP will follow up for dysphagia management, indication for instrumental evaluation.  Pt agreeable to plan. SLP Visit Diagnosis: Dysphagia, pharyngeal phase (R13.13)    Aspiration Risk  Mild aspiration risk    Diet Recommendation Dysphagia 3 (Mech soft);Thin liquid    Liquid Administration via: Cup;Straw Medication  Administration: Whole meds with puree Supervision: Patient able to self feed Compensations: Slow rate;Small sips/bites Postural Changes: Seated upright at 90 degrees;Remain upright for at least 30 minutes after po intake    Other  Recommendations Oral Care Recommendations: Oral care BID Caregiver Recommendations: Have oral suction available    Recommendations for follow up therapy are one component of a multi-disciplinary discharge planning process, led by the attending physician.  Recommendations may be updated based on patient status, additional functional criteria and insurance authorization.  Follow up Recommendations Home health SLP      Assistance Recommended at Discharge    Functional Status Assessment Patient has had a recent decline in their functional status and/or demonstrates limited ability to make significant improvements in function in a reasonable and predictable amount of time  Frequency and Duration min 2x/week  2 weeks       Prognosis Prognosis for improved oropharyngeal function: Fair Barriers to Reach Goals: Time post onset;Other (Comment) Barriers/Prognosis Comment: progressive nature of disease process      Swallow Study   General Date of Onset: 12/08/22 HPI: pt is an 82 yo female adm to [facility] with hypotension and weakness - diagnosed with UTI, pna, and  sepsis,  Pt with PMH + for MS, neurogenic bladder s/p supra pubic cathether, tremor, seizures, dysphagia with silent aspiration, h/o falls, pna, resides with her spouse, Kelly Cole.  She has a caregiver at home.  Swallow eval ordered.  CXR concerning for potential pna. Type of Study: Bedside Swallow Evaluation Diet Prior to this Study:  NPO Temperature Spikes Noted: No Respiratory Status: Room air History of Recent Intubation: No Behavior/Cognition: Alert;Cooperative;Pleasant mood Oral Cavity Assessment: Within Functional Limits Oral Care Completed by SLP: No Oral Cavity - Dentition: Adequate natural  dentition Vision: Functional for self-feeding Self-Feeding Abilities: Needs assist (secondary to tremor) Patient Positioning: Upright in bed Baseline Vocal Quality: Low vocal intensity Volitional Cough: Weak Volitional Swallow: Unable to elicit    Oral/Motor/Sensory Function Overall Oral Motor/Sensory Function: Within functional limits   Ice Chips Ice chips: Not tested   Thin Liquid Thin Liquid: Impaired Presentation: Straw;Self Fed;Spoon Pharyngeal  Phase Impairments: Multiple swallows;Cough - Immediate    Nectar Thick Nectar Thick Liquid:  (slightly thicker liquids) Presentation: Straw;Self Fed   Honey Thick Honey Thick Liquid: Not tested   Puree Puree: Within functional limits Presentation: Self Fed;Spoon   Solid     Solid: Impaired Presentation: Self Fed Oral Phase Impairments: Impaired mastication Oral Phase Functional Implications: Impaired mastication;Prolonged oral transit      Kelly Cole 12/08/2022,12:00 PM  Kelly Infante, MS [facility] SLP Acute Rehab Services Office [phone]

## 2022-12-08 NOTE — Progress Notes (Signed)
Initial Nutrition Assessment  DOCUMENTATION CODES:   Not applicable  INTERVENTION:  - DYS 3 with Nectar Thickened liquids per SLP recs.  [employer] 1.4 PO BID, each supplement provides 455 kcal and 20 grams protein. - Encourage intake as tolerated.  - Add Multivitamin with minerals daily - Monitor weight trends.   NUTRITION DIAGNOSIS:   Increased nutrient needs related to acute illness as evidenced by estimated needs.  GOAL:   Patient will meet greater than or equal to 90% of their needs  MONITOR:   PO intake, Supplement acceptance, Diet advancement, Weight trends  REASON FOR ASSESSMENT:   Consult Assessment of nutrition requirement/status  ASSESSMENT:   82 y.o. female with PMH multiple sclerosis and HTN who presented with low blood pressures. Admitted for sepsis.   Patient working with another provider at time of visit. Husband, [name], at bedside provided nutrition history.   UBW reported to be 135# and they report weight gain over the past 10 years.  Per EMR, last weight prior to this admit was in October 2022. Patient has had a 15# weight gain since that time.   Patient typically consumes 2 meals a day at home, a later breakfast/early lunch at 12pm and a dinner at 6pm. Patient follows a gluten free (not due to allergy but due to sensitivity per husband) and dairy free diet at home. Often has oatmeal with sausage in it, cheese toast, or a PB and jelly sandwich for her first meal and "chicken bog" (chicken with rice in broth) or cut up hamburgers. He reports patient has been consuming thickened liquids at home recently.  Does not consume any supplements at home.   Diet just advanced prior to visit to DYS 3 with nectar thickened liquids per SLP recs. Husband feels patient would be agreeable to try Molli Posey (for completely dairy free option).     Medications reviewed and include: 1mg  folic acid  Labs reviewed:  Na 128   NUTRITION - FOCUSED PHYSICAL  EXAM:  Unable to perform; patient working with another provider  Diet Order:   Diet Order             DIET DYS 3 Room service appropriate? Yes; Fluid consistency: Nectar Thick  Diet effective now                   EDUCATION NEEDS:  Education needs have been addressed  Skin:  Skin Assessment: Reviewed RN Assessment  Last BM:  10/29  Height:  Ht Readings from Last 1 Encounters:  12/08/22 5' 5.5" (1.664 m)   Weight:  Wt Readings from Last 1 Encounters:  12/07/22 61.2 kg    BMI:  Body mass index is 22.12 kg/m.  Estimated Nutritional Needs:  Kcal:  1700-1850 kcals Protein:  75-90 grams Fluid:  >/= 1.7L    Shelle Iron RD, LDN For contact information, refer to [facility].

## 2022-12-08 NOTE — Assessment & Plan Note (Signed)
Now improving  CT abd pending to eval for any intraabdominal etiology given   Septic shock initially and suprapubic catheter  NPO until resulted

## 2022-12-08 NOTE — Progress Notes (Signed)
PT Cancellation Note  Patient Details Name: Kelly Cole MRN: [id number] DOB: 04/27/1940   Cancelled Treatment:    Reason Eval/Treat Not Completed: Fatigue/lethargy limiting ability to participate  Patient sleeping when PT attempted. Will check back another time.  [name] PT Acute Rehabilitation Services Office [phone] Weekend pager-[phone]  Rada Hay 12/08/2022, 2:20 PM

## 2022-12-09 ENCOUNTER — Inpatient Hospital Stay (HOSPITAL_COMMUNITY): Payer: PPO

## 2022-12-09 DIAGNOSIS — Z515 Encounter for palliative care: Secondary | ICD-10-CM

## 2022-12-09 DIAGNOSIS — G35 Multiple sclerosis: Secondary | ICD-10-CM | POA: Diagnosis not present

## 2022-12-09 DIAGNOSIS — E872 Acidosis, unspecified: Secondary | ICD-10-CM | POA: Diagnosis not present

## 2022-12-09 DIAGNOSIS — A419 Sepsis, unspecified organism: Secondary | ICD-10-CM | POA: Diagnosis not present

## 2022-12-09 DIAGNOSIS — T83511D Infection and inflammatory reaction due to indwelling urethral catheter, subsequent encounter: Secondary | ICD-10-CM | POA: Diagnosis not present

## 2022-12-09 DIAGNOSIS — Z9359 Other cystostomy status: Secondary | ICD-10-CM | POA: Diagnosis not present

## 2022-12-09 DIAGNOSIS — Z7189 Other specified counseling: Secondary | ICD-10-CM | POA: Diagnosis not present

## 2022-12-09 DIAGNOSIS — R6521 Severe sepsis with septic shock: Secondary | ICD-10-CM | POA: Diagnosis not present

## 2022-12-09 DIAGNOSIS — J189 Pneumonia, unspecified organism: Secondary | ICD-10-CM | POA: Diagnosis not present

## 2022-12-09 LAB — BASIC METABOLIC PANEL
Anion gap: 6 (ref 5–15)
BUN: 8 mg/dL (ref 8–23)
CO2: 20 mmol/L — ABNORMAL LOW (ref 22–32)
Calcium: 7.2 mg/dL — ABNORMAL LOW (ref 8.9–10.3)
Chloride: 102 mmol/L (ref 98–111)
Creatinine, Ser: 0.37 mg/dL — ABNORMAL LOW (ref 0.44–1.00)
GFR, Estimated: >60 mL/min (ref >60)
Glucose, Bld: 103 mg/dL — ABNORMAL HIGH (ref 70–99)
Potassium: 3.9 mmol/L (ref 3.5–5.1)
Sodium: 128 mmol/L — ABNORMAL LOW (ref 135–145)

## 2022-12-09 MED ORDER — SENNOSIDES-DOCUSATE SODIUM 8.6-50 MG PO TABS
2.0000 | ORAL_TABLET | Freq: Two times a day (BID) | ORAL | Status: DC
  Administered 2022-12-09 – 2022-12-11 (×5): 2 via ORAL
  Filled 2022-12-09 (×5): qty 2

## 2022-12-09 MED ORDER — POLYETHYLENE GLYCOL 3350 17 G PO PACK
17.0000 g | PACK | Freq: Every day | ORAL | Status: DC
  Administered 2022-12-10 – 2022-12-11 (×2): 17 g via ORAL
  Filled 2022-12-09 (×3): qty 1

## 2022-12-09 NOTE — TOC CM/SW Note (Addendum)
Transition of Care [facility]) - Inpatient Brief Assessment   Patient Details  Name: Kelly Cole MRN: [id number] Date of Birth: Jul 01, 1940  Transition of Care [facility]) CM/SW Contact:    Darleene Cleaver, LCSW Phone Number: 12/09/2022, 6:05 PM   Clinical Narrative:  Patient is an 82 year old female who is bedbound.  Patient has family and caregiver support in the home.  Patient plans to return back home with her family, palliative following patient in hospital, patient on a dysphagia 3 diet.  May need EMS transport home.  Transition of Care Asessment: Insurance and Status: Insurance coverage has been reviewed Patient has primary care physician: Yes Home environment has been reviewed: Yes Prior level of function:: Bedbound, but has family and caregiver support along with [facility] speech. Prior/Current Home Services: Current home services Social Determinants of Health Reivew: SDOH reviewed needs interventions Readmission risk has been reviewed: Yes Transition of care needs: transition of care needs identified, TOC will continue to follow

## 2022-12-09 NOTE — Consult Note (Signed)
Consultation Note Date: 12/09/2022   Patient Name: Kelly Cole  DOB: [birth year]  MRN: [id number]  Age / Sex: 82 y.o., female  PCP: Merlene Laughter, MD (Inactive) Referring Physician: Kathlen Mody, MD  Reason for Consultation:  goals of care  HPI/Patient Profile: 82 y.o. female  with past medical history of MS, HTN, seizure, tremors, dysphagia admitted on 12/07/2022 with sepsis related to pneumonia and UTI. Sepsis physiology improving. Blood culture positive x 1- likely contaminant. SLP eval-  MBS indicated some silent aspiration. Palliative medicine consulted for goals of care.    Primary Decision Maker PATIENT  Discussion: I have reviewed medical records including Care Everywhere, progress notes from this and prior admissions, labs and imaging, discussed with RN.  On evaluation patient is awake and alert. Was disoriented earlier per nursing- but was clearer on my evaluation. She was able to participate in goals of care discussion. She was able to understand choices that were presented, able to make a choice, and able to rationalize a clear reason for her choice that was in line with her previous discussions with her spouse.   Josmary and [name] moved to [place] from [place]. She is bedbound. Has caregivers in the home. Requires assistance for ADL's.  She reads the newspaper daily. Watches tv. Acknowledges that she doesn't have much fun anymore.   We discussed patient's current illness and what it means in the larger context of patient's on-going co-morbidities.  Natural disease trajectory and expectations at EOL were discussed.  Continued aggressive medical care vs comfort measures were discussed.   Patient expressed that she would want to continue current interventions to maximize hospitalization and return home.  If she declines at home, or has another life threatening event, she would not want to  return to hospital, and she would want only to be comfortable and allow for natural passing.  However, she does have some hesitation in making this decision- she worries about legacy planning and the fact that we are discussing a decision about a possible occurrence in the future.  Space was given to [name] and Elan to share their thoughts and feelings regarding these decisions.   SUMMARY OF RECOMMENDATIONS -Continue current plan of care with goal of returning home -PMT will follow up over next few days to confirm advanced care planning- if patient and spouse agree that patient would want only comfort measures in the future and allowing of natural dying process- then I would recommend hospice services to ensure she didn't suffer- we did not speak about hospice today    Code Status/Advance Care Planning: DNR   Prognosis:   Unable to determine - due to her ongoing aspiration she is high risk for another recurrence of pneumonia, she is weak and deconditioned from this event which also increases her risk- I would not be surprised if she had another infectious event within the next 6 months  Discharge Planning: To Be Determined  Primary Diagnoses: Present on Admission:  Multiple sclerosis [facility])  Essential hypertension  Scoliosis  Essential tremor  UTI (urinary tract infection) due to urinary indwelling catheter (HCC)  CAP (community acquired pneumonia)  Septic shock (HCC)  Anemia  Hyponatremia  Sepsis (HCC)  Hypomagnesemia  Lactic acidosis   Review of Systems  Neurological:  Positive for tremors.    Physical Exam Vitals and nursing note reviewed.  Constitutional:      General: She is not in acute distress. Pulmonary:     Effort: Pulmonary effort is normal.     Comments: Intermittent cough Neurological:     Mental Status: She is alert.     Comments: tremors     Vital Signs: BP (!) 141/49   Pulse 78   Temp 97.8 F (36.6 C) (Axillary)   Resp (!) 23   Ht 5' 5.5" (1.664  m)   Wt 61.2 kg   SpO2 98%   BMI 22.12 kg/m  Pain Scale: 0-10   Pain Score: 0-No pain   SpO2: SpO2: 98 % O2 Device:SpO2: 98 % O2 Flow Rate: .   IO: Intake/output summary:  Intake/Output Summary (Last 24 hours) at 12/09/2022 1614 Last data filed at 12/09/2022 0800 Gross per 24 hour  Intake 2370.27 ml  Output 1475 ml  Net 895.27 ml    LBM: Last BM Date : 12/07/22 Baseline Weight: Weight: 61.2 kg Most recent weight: Weight: 61.2 kg       Thank you for this consult. Palliative medicine will continue to follow and assist as needed.  Time Total: 90 minutes Signed by: Ocie Bob, AGNP-C Palliative Medicine  Time includes:   Preparing to see the patient (e.g., review of tests) Obtaining and/or reviewing separately obtained history Performing a medically necessary appropriate examination and/or evaluation Counseling and educating the patient/family/caregiver Ordering medications, tests, or procedures Referring and communicating with other health care professionals (when not reported separately) Documenting clinical information in the electronic or other health record Independently interpreting results (not reported separately) and communicating results to the patient/family/caregiver Care coordination (not reported separately) Clinical documentation   Please contact Palliative Medicine Team phone at [phone] for questions and concerns.  For individual provider: See Loretha Stapler

## 2022-12-09 NOTE — Progress Notes (Signed)
OT Cancellation Note  Patient Details Name: Kelly Cole MRN: [id number] DOB: [birth year]   Cancelled Treatment:    Reason Eval/Treat Not Completed: OT screened completed. OT & PT spoke with pt's spouse who stated pt has been bed bound for the past 9-12 months, she has daily caregivers for 8 hrs, and she requires significant assistance with all ADLs at bed level.. She also required assist with feeding  at her baseline. He indicated she has had difficulty with feeding during her hospital stay, however this appears to largely due to her increased lethargy. Pt appears to be at her baseline level of functioning for self-care management, therefore OT will sign off. Thank you.   Reuben Likes, OTR/L 12/09/2022, 12:46 PM

## 2022-12-09 NOTE — Progress Notes (Signed)
PT Cancellation Note  Patient Details Name: Kelly Cole MRN: [id number] DOB: Mar 03, 1940   Cancelled Treatment:    Reason Eval/Treat Not Completed: PT screened, no needs identified, will sign off  Per spouse, patient does not get OOB, has CNA  daily. Per spouse, no needs at this time.  [name] PT Acute Rehabilitation Services Office [phone] Weekend [phone]  Rada Hay 12/09/2022, 12:24 PM

## 2022-12-09 NOTE — Progress Notes (Signed)
[facility]                                                                               Kelly Cole, is a 82 y.o. female, DOB - February 27, 1940, ZOX:[id number] Admit date - 12/07/2022    Outpatient Primary MD for the patient is Merlene Laughter, MD (Inactive)  LOS - 1  days    Brief summary   Kelly Cole is a 82 y.o. female with medical history significant of MS, HTN, neurogenic bladder status post suprapubic catheter , history of seizure  presented to ED for acute metabolic encephalopathy.  Assessment & Plan    Assessment and Plan:  Severe sepsis:  On arrival to ED, she was afebrile, tachycardic 111/min, tachypnea 29/min, and hypotensive 86/22 mmhg, with leukocytosis of 17,000, lactic acid level of 4. Altered mentation on admission, was found to have UTI with indwelling supra pubic catheter, and Pneumonia with gram negative bacteremia.  Started her on IV ceftriaxone and IV Azithromycin.  Leukocytosis improving.  Blood cultures showing  e coli and staph species, further identification and sensitivities.  Lactic acid level improved.  Influenza A, B, COVID PCR is negative. RSV is negative.  Pro calcitonin is negative.      Acute metabolic encephalopathy from UTI and pneumonia.  Improving. She is alert and oriented to person only . She appears to be at baseline.  SLP evaluation recommending dysphagia 3 diet.  MBS done today and recommendations given.    Hypomagnesemia and hypophosphatemia:  Replaced and repeat level wnl.    Hyponatremia:  Probably from hypovolemic and dehydration.  Sodium on admission 119  improved to 128  to 131 to 128 today, with IV fluids.     Vascular congestion on CXR Echocardiogram showed LVEF of 60% to 65 %, Left ventricular  diastolic parameters are  indeterminate.   Anemia of chronic disease in addition to iron deficiency and folate deficiency anemia.  Iron and folate supplementation added.   Hypertension Blood pressure  parameters have improved Continue to monitor   History of multiple sclerosis Resume home meds.   Therapy evaluations done and recommendation given.     Estimated body mass index is 22.12 kg/m as calculated from the following:   Height as of this encounter: 5' 5.5" (1.664 m).   Weight as of this encounter: 61.2 kg.  Code Status: DNR Limited.  DVT Prophylaxis:  SCDs Start: 12/08/22 0100   Level of Care: Level of care: Med-Surg Family Communication: None at bedside.   Disposition Plan:     Remains inpatient appropriate:  IV antibiotics.  Procedures:  None.   Consultants:   None.   Antimicrobials:   Anti-infectives (From admission, onward)    Start     Dose/Rate Route Frequency Ordered Stop   12/09/22 1000  vancomycin (VANCOREADY) IVPB 1250 mg/250 mL  Status:  Discontinued        1,250 mg 166.7 mL/hr over 90 Minutes Intravenous Every 36 hours 12/07/22 2350 12/08/22 1035   12/08/22 1800  azithromycin (ZITHROMAX) 500 mg in sodium chloride 0.9 % 250 mL IVPB        500 mg 250 mL/hr over 60 Minutes Intravenous Every 24  hours 12/07/22 2052 12/09/22 2359   12/08/22 1600  cefTRIAXone (ROCEPHIN) 2 g in sodium chloride 0.9 % 100 mL IVPB        2 g 200 mL/hr over 30 Minutes Intravenous Every 24 hours 12/08/22 1039     12/08/22 0400  ceFEPIme (MAXIPIME) 2 g in sodium chloride 0.9 % 100 mL IVPB  Status:  Discontinued        2 g 200 mL/hr over 30 Minutes Intravenous Every 12 hours 12/07/22 2224 12/08/22 1035   12/08/22 0000  ceFEPIme (MAXIPIME) 2 g in sodium chloride 0.9 % 100 mL IVPB  Status:  Discontinued        2 g 200 mL/hr over 30 Minutes Intravenous Every 8 hours 12/07/22 2125 12/07/22 2224   12/07/22 2215  vancomycin (VANCOCIN) IVPB 1000 mg/200 mL premix        1,000 mg 200 mL/hr over 60 Minutes Intravenous  Once 12/07/22 2209 12/07/22 2352   12/07/22 2030  azithromycin (ZITHROMAX) 500 mg in sodium chloride 0.9 % 250 mL IVPB        500 mg 250 mL/hr over 60 Minutes  Intravenous  Once 12/07/22 2019 12/07/22 2259   12/07/22 1600  ceFEPIme (MAXIPIME) 2 g in sodium chloride 0.9 % 100 mL IVPB        2 g 200 mL/hr over 30 Minutes Intravenous  Once 12/07/22 1549 12/07/22 1653        Medications  Scheduled Meds:  busPIRone  15 mg Oral BID   Chlorhexidine Gluconate Cloth  6 each Topical Daily   cholestyramine light  4 g Oral QID   feeding supplement (KATE FARMS STANDARD 1.4)  325 mL Oral BID BM   fluticasone  1 spray Each Nare Daily   folic acid  1 mg Oral Daily   lamoTRIgine  25 mg Oral BID   multivitamin with minerals  1 tablet Oral Daily   propranolol  40 mg Oral BID   QUEtiapine  50 mg Oral QHS   venlafaxine  100 mg Oral TID WC   Continuous Infusions:  azithromycin Stopped (12/08/22 1759)   cefTRIAXone (ROCEPHIN)  IV Stopped (12/08/22 1532)   PRN Meds:.acetaminophen **OR** acetaminophen, food thickener, LORazepam, ondansetron **OR** ondansetron (ZOFRAN) IV    Subjective:   Kelly Cole was seen and examined today.  She appears cheerful, though confused, suspect this is her baseline.   Objective:   Vitals:   12/09/22 0900 12/09/22 1000 12/09/22 1033 12/09/22 1200  BP:  (!) 141/49 (!) 141/49   Pulse: 83 76 78   Resp: (!) 21 (!) 23    Temp:    97.8 F (36.6 C)  TempSrc:    Axillary  SpO2: 98% 98%    Weight:      Height:        Intake/Output Summary (Last 24 hours) at 12/09/2022 1408 Last data filed at 12/09/2022 0800 Gross per 24 hour  Intake 3113.26 ml  Output 1475 ml  Net 1638.26 ml   Filed Weights   12/07/22 1428  Weight: 61.2 kg     Exam General exam: elderly woman, not in distress, bedbound Respiratory system: Clear to auscultation. Respiratory effort normal. Cardiovascular system: S1 & S2 heard, RRR. Gastrointestinal system: Abdomen is nondistended, soft and nontender. Central nervous system: Alert and oriented to person only Extremities: Symmetric 5 x 5 power. Skin: No rashes,  Psychiatry:  Mood & affect  appropriate.     Data Reviewed:  I have personally reviewed following labs and  imaging studies   CBC Lab Results  Component Value Date   WBC 14.1 (H) 12/08/2022   RBC 3.32 (L) 12/08/2022   HGB 9.5 (L) 12/08/2022   HCT 29.3 (L) 12/08/2022   MCV 88.3 12/08/2022   MCH 28.6 12/08/2022   PLT 205 12/08/2022   MCHC 32.4 12/08/2022   RDW 13.9 12/08/2022   LYMPHSABS 0.7 12/07/2022   MONOABS 1.0 12/07/2022   EOSABS 0.1 12/07/2022   BASOSABS 0.0 12/07/2022     Last metabolic panel Lab Results  Component Value Date   NA 128 (L) 12/09/2022   K 3.9 12/09/2022   CL 102 12/09/2022   CO2 20 (L) 12/09/2022   BUN 8 12/09/2022   CREATININE 0.37 (L) 12/09/2022   GLUCOSE 103 (H) 12/09/2022   GFRNONAA >60 12/09/2022   GFRAA 52 (L) 07/13/2019   CALCIUM 7.2 (L) 12/09/2022   PHOS 3.2 12/08/2022   PROT 4.8 (L) 12/08/2022   ALBUMIN 2.6 (L) 12/08/2022   BILITOT 0.3 12/08/2022   ALKPHOS 52 12/08/2022   AST 13 (L) 12/08/2022   ALT 10 12/08/2022   ANIONGAP 6 12/09/2022    CBG (last 3)  No results for input(s): "GLUCAP" in the last 72 hours.    Coagulation Profile: Recent Labs  Lab 12/07/22 1730  INR 1.0     Radiology Studies: DG Swallowing Func-Speech Pathology  Result Date: 12/09/2022 Table formatting from the original result was not included. Modified Barium Swallow Study Patient Details Name: Kelly Cole MRN: [id number] Date of Birth: 08/28/1940 Today's Date: 12/09/2022 HPI/PMH: HPI: pt is an 82 yo female adm to [facility] with hypotension and weakness - diagnosed with UTI, pna, and  sepsis,  Pt with PMH + for MS, neurogenic bladder s/p supra pubic cathether, tremor, seizures, dysphagia with silent aspiration, h/o falls, pna, resides with her spouse, Kelly Cole.  She has a caregiver at home.  Swallow eval ordered.  CXR concerning for potential pna. Clinical Impression: Clinical Impression: Pt continues with moderate oropharyngeal dysphagia resulting in decreased propulsive effort in transiting  solids *cracker bolus* into pharynx - with resultant lingual pumping of tongue base x 4-6 to transit masticated cracker into pharynx to vallecular region. In addition, premature spillage of thin liquids to pyriform sinus noted prior to swallow initiation, causing laryngeal penetration. Lingual base retraction impaired as well as pharyngeal squeeze resulting in decreased epiglottic deflection with moderate vallecular region noted post-swallow. Pt senses retention and was able to both expectorate per cue and swallow thin liquids to transit into esophagus. She did have episodic trace aspiration and laryngeal penetration of thin liquids and secretions. Chin tuck posture was unable to be performed adequately due to discomfort, however head turn left improved pharyngeal squeeze and resulted in less retention. Pt also noted to have neck extension posture t/o study. Pt did aspirate small amount of nectar thick liquid when swallowing barium tablet - without cough response. Deeper aspirate were not cleared with cued cough. Upon esophageal sweep, pt appeared with retention with barium tablet at distal region without sensation. Several boluses of nectar and pudding did not appear to transit tablet into stomach - radiologist not present - suspect component of esophageal dysphagia -suspect due to both her scoliosis and MS. Prominent PES noted that did not impact barium flow. At this time; recommend thin liquids via tsp anytime, slightly thicker drinks and solids with head turn left, follow solid with liquids. Occasional cough following liquid swallow and expectoration following meals or if sense pharyngeal retention unable to clear. Medicine  with nectar small sips *consider crushed if not contraindicated. Follow up for SLP active dysphagia treatment advised to address lingual base/pharyngeal strengthening. Using teach back and live video, pt educated to findings/recommendations. Factors that may increase risk of adverse event in  presence of aspiration Rubye Oaks & Clearance Coots 2021): Factors that may increase risk of adverse event in presence of aspiration Rubye Oaks & Clearance Coots 2021): Limited mobility; Frail or deconditioned; Dependence for feeding and/or oral hygiene; Reduced cognitive function Recommendations/Plan: Swallowing Evaluation Recommendations Swallowing Evaluation Recommendations Recommendations: PO diet PO Diet Recommendation: Dysphagia 3 (Mechanical soft); Dysphagia 2 (Finely chopped); Full liquid diet; Mildly thick liquids (Level 2, nectar thick); Thin liquids (Level 0) Liquid Administration via: Straw; Spoon; Cup Medication Administration: -- (crush with nectar) Supervision: Patient able to self-feed Swallowing strategies  : effortful swallow; Head tilt left during swallowing; Hard cough after swallowing; Follow solids with liquids; Multiple dry swallows after each bite/sip Postural changes: Position pt fully upright for meals; Stay upright 30-60 min after meals Oral care recommendations: Oral care BID (2x/day) Caregiver Recommendations: Have oral suction available Treatment Plan Treatment Plan Treatment recommendations: Therapy as outlined in treatment plan below Follow-up recommendations: Home health SLP Functional status assessment: Patient has had a recent decline in their functional status and demonstrates the ability to make significant improvements in function in a reasonable and predictable amount of time. Treatment frequency: Min 1x/week Treatment duration: 1 week Interventions: Aspiration precaution training; Patient/family education; Respiratory muscle strength training; Diet toleration management by SLP; Compensatory techniques Recommendations Recommendations for follow up therapy are one component of a multi-disciplinary discharge planning process, led by the attending physician.  Recommendations may be updated based on patient status, additional functional criteria and insurance authorization. Assessment: Orofacial Exam:  Orofacial Exam Oral Cavity: Oral Hygiene: Xerostomia Oral Cavity - Dentition: Adequate natural dentition Orofacial Anatomy: WFL Oral Motor/Sensory Function: WFL Anatomy: Anatomy: Prominent cricopharyngeus Boluses Administered: Boluses Administered Boluses Administered: Thin liquids (Level 0); Mildly thick liquids (Level 2, nectar thick); Moderately thick liquids (Level 3, honey thick); Puree; Solid  Oral Impairment Domain: Oral Impairment Domain Lip Closure: No labial escape Tongue control during bolus hold: Posterior escape of less than half of bolus Bolus preparation/mastication: Disorganized chewing/mashing with solid pieces of bolus unchewed Bolus transport/lingual motion: Repetitive/disorganized tongue motion (with solids) Oral residue: Trace residue lining oral structures Location of oral residue : Tongue Initiation of pharyngeal swallow : Pyriform sinuses; Valleculae  Pharyngeal Impairment Domain: Pharyngeal Impairment Domain Soft palate elevation: No bolus between soft palate (SP)/pharyngeal wall (PW) Laryngeal elevation: Partial superior movement of thyroid cartilage/partial approximation of arytenoids to epiglottic petiole Anterior hyoid excursion: Complete anterior movement Epiglottic movement: Partial inversion Laryngeal vestibule closure: Incomplete, narrow column air/contrast in laryngeal vestibule Pharyngeal stripping wave : Present - diminished Pharyngeal contraction (A/P view only): N/A Pharyngoesophageal segment opening: Partial distention/partial duration, partial obstruction of flow Tongue base retraction: Narrow column of contrast or air between tongue base and PPW Pharyngeal residue: Collection of residue within or on pharyngeal structures Location of pharyngeal residue: Valleculae; Pyriform sinuses  Esophageal Impairment Domain: Esophageal Impairment Domain Esophageal clearance upright position: Esophageal retention Pill: Pill Consistency administered: Mildly thick liquids (Level 2, nectar  thick) Mildly thick liquids (Level 2, nectar thick): Impaired (see clinical impressions) Penetration/Aspiration Scale Score: Penetration/Aspiration Scale Score 1.  Material does not enter airway: Moderately thick liquids (Level 3, honey thick); Puree; Solid; Pill 2.  Material enters airway, remains ABOVE vocal cords then ejected out: Mildly thick liquids (Level 2, nectar thick) 3.  Material enters airway,  remains ABOVE vocal cords and not ejected out: Thin liquids (Level 0) 8.  Material enters airway, passes BELOW cords without attempt by patient to eject out (silent aspiration) : Mildly thick liquids (Level 2, nectar thick); Thin liquids (Level 0) Compensatory Strategies: Compensatory Strategies Compensatory strategies: Yes Effortful swallow: Ineffective Ineffective Effortful Swallow: Puree; Solid Multiple swallows: Effective Effective Multiple Swallows: Mildly thick liquid (Level 2, nectar thick); Moderately thick liquid (Level 3, honey thick) Chin tuck: Ineffective (inadequately performed due to discomfort) Liquid wash: Effective Effective Liquid Wash: Solid Left head turn: Effective Effective Left Head Turn: Solid   General Information: Caregiver present: No  Diet Prior to this Study: Dysphagia 3 (mechanical soft); Mildly thick liquids (Level 2, nectar thick)   Temperature : Normal   Respiratory Status: WFL   Supplemental O2: None (Room air)   History of Recent Intubation: No  Behavior/Cognition: Alert; Cooperative; Pleasant mood Self-Feeding Abilities: Needs hand-over-hand assist for feeding Baseline vocal quality/speech: Normal Volitional Cough: Able to elicit Volitional Swallow: Unable to elicit Exam Limitations: No limitations Goal Planning: Prognosis for improved oropharyngeal function: Fair Barriers to Reach Goals: Time post onset Barriers/Prognosis Comment: progressive nature of disease process Patient/Family Stated Goal: none stated Consulted and agree with results and recommendations: Patient Pain: Pain  Assessment Pain Assessment: No/denies pain End of Session: Start Time:SLP Start Time (ACUTE ONLY): 0829 Stop Time: SLP Stop Time (ACUTE ONLY): 0911 Time Calculation:SLP Time Calculation (min) (ACUTE ONLY): 42 min Charges: SLP Evaluations $ SLP Speech Visit: 1 Visit SLP Evaluations $BSS Swallow: 1 Procedure $MBS Swallow: 1 Procedure $Swallowing Treatment: 1 Procedure SLP visit diagnosis: SLP Visit Diagnosis: Dysphagia, oropharyngeal phase (R13.12) Past Medical History: Past Medical History: Diagnosis Date  HTN (hypertension)   Multiple sclerosis (HCC)  Past Surgical History: Past Surgical History: Procedure Laterality Date  ABDOMINAL HYSTERECTOMY    APPENDECTOMY    CHOLECYSTECTOMY    HYSTERCTOMY   [name] 12/09/2022, 10:27 AM  ECHOCARDIOGRAM COMPLETE  Result Date: 12/08/2022    ECHOCARDIOGRAM REPORT   Patient Name:   [facility] DAESHA SUTHERBY Date of Exam: 12/08/2022 Medical Rec #:  [id number]     Height:       65.5 in Accession #:    [id number]    Weight:       135.0 lb Date of Birth:  July 26, 1940     BSA:          1.683 m Patient Age:    82 years      BP:           105/51 mmHg Patient Gender: F             HR:           99 bpm. Exam Location:  Inpatient Procedure: 2D Echo, Cardiac Doppler and Color Doppler Indications:    CHF  History:        Patient has no prior history of Echocardiogram examinations.                 Risk Factors:Hypertension.  Sonographer:    Darlys Gales Referring Phys: Herminio Heads IMPRESSIONS  1. Left ventricular ejection fraction, by estimation, is 60 to 65%. The left ventricle has normal function. Left ventricular endocardial border not optimally defined to evaluate regional wall motion. Left ventricular diastolic parameters are indeterminate.  2. Right ventricular systolic function is normal. The right ventricular size is mildly enlarged. There is mildly elevated pulmonary artery systolic pressure. The estimated right ventricular systolic pressure is 40.2 mmHg.  3. The mitral valve  is grossly normal. Trivial mitral valve regurgitation. No evidence of mitral stenosis.  4. The aortic valve is grossly normal. Aortic valve regurgitation is not visualized. No aortic stenosis is present.  5. The inferior vena cava is normal in size with greater than 50% respiratory variability, suggesting right atrial pressure of 3 mmHg. FINDINGS  Left Ventricle: Left ventricular ejection fraction, by estimation, is 60 to 65%. The left ventricle has normal function. Left ventricular endocardial border not optimally defined to evaluate regional wall motion. The left ventricular internal cavity size was normal in size. There is no left ventricular hypertrophy. Left ventricular diastolic parameters are indeterminate. Right Ventricle: The right ventricular size is mildly enlarged. No increase in right ventricular wall thickness. Right ventricular systolic function is normal. There is mildly elevated pulmonary artery systolic pressure. The tricuspid regurgitant velocity is 3.05 m/s, and with an assumed right atrial pressure of 3 mmHg, the estimated right ventricular systolic pressure is 40.2 mmHg. Left Atrium: Left atrial size was not well visualized. Right Atrium: Right atrial size was not well visualized. Pericardium: Trivial pericardial effusion is present. Presence of epicardial fat layer. Mitral Valve: The mitral valve is grossly normal. Trivial mitral valve regurgitation. No evidence of mitral valve stenosis. Tricuspid Valve: The tricuspid valve is grossly normal. Tricuspid valve regurgitation is mild . No evidence of tricuspid stenosis. Aortic Valve: The aortic valve is grossly normal. Aortic valve regurgitation is not visualized. No aortic stenosis is present. Aortic valve mean gradient measures 4.0 mmHg. Aortic valve peak gradient measures 6.9 mmHg. Aortic valve area, by VTI measures 1.89 cm. Pulmonic Valve: The pulmonic valve was grossly normal. Pulmonic valve regurgitation is not visualized. No evidence of  pulmonic stenosis. Aorta: The aortic root and ascending aorta are structurally normal, with no evidence of dilitation. Venous: The inferior vena cava is normal in size with greater than 50% respiratory variability, suggesting right atrial pressure of 3 mmHg. IAS/Shunts: The interatrial septum was not well visualized. Additional Comments: There is a small pleural effusion in the left lateral region.  LEFT VENTRICLE PLAX 2D LVIDd:         3.90 cm   Diastology LVIDs:         2.60 cm   LV e' medial:    5.44 cm/s LV PW:         0.80 cm   LV E/e' medial:  18.6 LV IVS:        0.90 cm   LV e' lateral:   4.90 cm/s LVOT diam:     1.70 cm   LV E/e' lateral: 20.6 LV SV:         47 LV SV Index:   28 LVOT Area:     2.27 cm  RIGHT VENTRICLE RV S prime:     11.40 cm/s TAPSE (M-mode): 1.5 cm LEFT ATRIUM           Index       RIGHT ATRIUM           Index LA Vol (A2C): 15.9 ml 9.45 ml/m  RA Area:     13.10 cm LA Vol (A4C): 9.5 ml  5.65 ml/m  RA Volume:   32.40 ml  19.25 ml/m  AORTIC VALVE AV Area (Vmax):    2.06 cm AV Area (Vmean):   1.91 cm AV Area (VTI):     1.89 cm AV Vmax:           131.00 cm/s AV Vmean:  97.700 cm/s AV VTI:            0.249 m AV Peak Grad:      6.9 mmHg AV Mean Grad:      4.0 mmHg LVOT Vmax:         119.00 cm/s LVOT Vmean:        82.000 cm/s LVOT VTI:          0.207 m LVOT/AV VTI ratio: 0.83  AORTA Ao Root diam: 3.20 cm Ao Asc diam:  3.50 cm MITRAL VALVE                TRICUSPID VALVE MV Area (PHT): 1.86 cm     TR Peak grad:   37.2 mmHg MV Decel Time: 408 msec     TR Vmax:        305.00 cm/s MV E velocity: 101.00 cm/s MV A velocity: 120.00 cm/s  SHUNTS MV E/A ratio:  0.84         Systemic VTI:  0.21 m                             Systemic Diam: 1.70 cm Lennie Odor MD Electronically signed by Lennie Odor MD Signature Date/Time: 12/08/2022/2:43:56 PM    Final    CT ABDOMEN PELVIS W CONTRAST  Result Date: 12/08/2022 CLINICAL DATA:  Abdominal pain. EXAM: CT ABDOMEN AND PELVIS WITH CONTRAST  TECHNIQUE: Multidetector CT imaging of the abdomen and pelvis was performed using the standard protocol following bolus administration of intravenous contrast. RADIATION DOSE REDUCTION: This exam was performed according to the departmental dose-optimization program which includes automated exposure control, adjustment of the mA and/or kV according to patient size and/or use of iterative reconstruction technique. CONTRAST:  OMNIPAQUE IOHEXOL 300 MG/ML  SOLN COMPARISON:  Portable chest yesterday, and CT of the abdomen and pelvis without contrast 04/09/2020 FINDINGS: Lower chest: There is mild cardiomegaly. Two-vessel calcifications in the LAD and right coronary arteries and a small but increased pericardial effusion are noted. Moderate-sized right and small left layering pleural effusions are present with adjacent consolidation or atelectasis in the lower lobes. There is increased linear atelectasis in the lingular and right middle lobe bases, otherwise, remainder of the more anterior lung bases are otherwise clear. Hepatobiliary: Stable degree of post cholecystectomy common bile duct prominence measuring 9 mm with mild intrahepatic biliary prominence. No ductal filling defect is evident allowing for spray artifact from the cholecystectomy clips. There is an 8 mm too small to characterize hypodensity in the hepatic dome in segment 8, which was seen previously and is probably a small cyst. Remainder of the liver is homogeneous without further focal abnormality. The portal vein is patent and normal in caliber. Pancreas: Diffusely atrophic. No mass enhancement. No ductal dilatation or inflammatory change. Spleen: No abnormality Adrenals/Urinary Tract: There is no adrenal mass. Left kidney is homogeneous. 3 mm calyceal stone noted in the left upper pole, 5 mm stone and 3 mm stone noted in the right upper and lower pole with punctate nonobstructive stones elsewhere in the right upper pole. There is no hydronephrosis,  but there is a 3 mm stone lodged in the inner orifice of the right UVJ, and there is mild urothelial thickening of the right renal pelvis, as well as right renal edema with patchy hypoenhancement consistent with pyelonephritis. Slight asymmetric right perinephric edema also noted. There is a suprapubic bladder catheter in place in the bladder. The bladder is  contracted. Perivesical stranding edema suspected along side the bladder compatible with cystitis. Stomach/Bowel: The distal stomach and descending duodenum migrated into the right upper abdominal quadrant due to a prior right hemicolectomy to the mid transverse segment. This was seen previously. There is a patent primary surgical ileocolic anastomosis. Chronic thickened folds again noted in the gastric antrum. The small bowel is normal caliber. The portion of the colon not removed is unremarkable apart from mild fecal stasis and uncomplicated scattered diverticula. Vascular/Lymphatic: Aortic atherosclerosis. No enlarged abdominal or pelvic lymph nodes. Multiple pelvic phleboliths. Reproductive: Status post hysterectomy. No adnexal masses. Other: Minimal nonlocalizing free fluid noted in the right pararenal space, right pericolic gutter and dorsal deep pelvis. No drainable pocket. There is no free air, free hemorrhage or localized collection. 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 No acute or other significant osseous findings. IMPRESSION: 1. 3 mm stone lodged in the inner orifice of the right UVJ without hydronephrosis, but with right renal edema and patchy hypoenhancement consistent with pyelonephritis. 2. Bilateral nephrolithiasis. 3. Suprapubic bladder catheter in place with contracted bladder and perivesical stranding edema compatible with cystitis. 4. Minimal nonlocalizing free fluid in the right pararenal space, right  pericolic gutter and dorsal deep pelvis. 5. Moderate-sized right and small left layering pleural effusions with adjacent consolidation or atelectasis in the lower lobes. 6. Cardiomegaly with small but increased pericardial effusion. 7. Aortic and coronary artery atherosclerosis. 8. 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 Old right hemicolectomy. 9. Constipation and diverticulosis. 10. Dextroscoliosis with advanced degenerative change in the lumbar spine and right hip. Multilevel severe acquired lumbar spinal stenosis. Aortic Atherosclerosis (ICD10-I70.0). Electronically Signed   By: Almira Bar M.D.   On: 12/08/2022 04:17   DG Chest Portable 1 View  Result Date: 12/07/2022 CLINICAL DATA:  Cough EXAM: PORTABLE CHEST 1 VIEW COMPARISON:  Chest x-ray 09/25/2020 FINDINGS: There is a small right pleural effusion. There some patchy opacities in both lung bases. There central pulmonary vascular congestion and cardiomegaly. There is no pneumothorax. There are calcified pleural plaques in both lung apices. There are healed left-sided rib fractures. No acute fractures are seen. IMPRESSION: 1. Small right pleural effusion. 2. Patchy opacities in both lung bases may represent atelectasis or infection. 3. Central pulmonary vascular congestion and cardiomegaly. Electronically Signed   By: Darliss Cheney M.D.   On: 12/07/2022 21:57       Kathlen Mody M.D. [facility] 12/09/2022, 2:08 PM  Available via Epic secure chat 7am-7pm After 7 pm, please refer to night coverage provider listed on amion.

## 2022-12-09 NOTE — Progress Notes (Addendum)
MBS completed, full report to follow.  Pt continues with moderate oropharyngeal dysphagia resulting in decreased propulsive effort in transiting solids *cracker bolus* into pharynx - with resultant lingual pumping of tongue base x 4-6 to transit masticated cracker into pharynx to vallecular region.  In addition, premature spillage of thin liquids to pyriform sinus noted prior to swallow initiation, causing laryngeal penetration.  Lingual base retraction impaired as well as pharyngeal squeeze resulting in decreased epiglottic deflection with moderate vallecular region noted post-swallow. Pt senses retention and was able to both expectorate per cue and swallow thin liquids to transit into esophagus.  She did have episodic trace aspiration and laryngeal penetration of thin liquids and secretions. Chin tuck posture was unable to be performed adequately due to discomfort, however head turn left improved pharyngeal squeeze and resulted in less retention.  Pt also noted to have neck extension posture t/o study.  Pt did aspirate small amount of nectar thick liquid when swallowing barium tablet - without cough response.     Upon esophageal sweep, pt appeared with retention with barium tablet at distal region without sensation. Several boluses of nectar and pudding did not appear to transit tablet into stomach - radiologist not present - suspect component of esophageal dysphagia -suspect due to both her scoliosis and MS.   Prominent PES noted that did not impact barium flow.    At this time; recommend thin liquids via tsp anytime, slightly thicker drinks and solids with head turn left, follow solid with liquids.  Occasional cough following liquid swallow and expectoration following meals or if sense pharyngeal retention unable to clear.   Medicine with nectar small sips *consider crushed if not contraindicated.  Follow up for SLP active dysphagia treatment advised to address lingual base/pharyngeal strengthening.   U sing  teach back, pt educated to findings/recommendations.  [name], MS [facility] SLP Acute [employer] [phone]

## 2022-12-09 NOTE — Progress Notes (Signed)
Modified Barium Swallow Study  Patient Details  Name: STEFFIE KLEINPETER MRN: [id number] Date of Birth: Nov 28, 1940  Today's Date: 12/09/2022  Modified Barium Swallow completed.  Full report located under Chart Review in the Imaging Section.  History of Present Illness pt is an 82 yo female adm to [facility] with hypotension and weakness - diagnosed with UTI, pna, and  sepsis,  Pt with PMH + for MS, neurogenic bladder s/p supra pubic cathether, tremor, seizures, dysphagia with silent aspiration, h/o falls, pna, resides with her spouse, [name].  She has a caregiver at home.  Swallow eval ordered.  CXR concerning for potential pna.   Clinical Impression Pt continues with moderate oropharyngeal dysphagia resulting in decreased propulsive effort in transiting solids *cracker bolus* into pharynx - with resultant lingual pumping of tongue base x 4-6 to transit masticated cracker into pharynx to vallecular region. In addition, premature spillage of thin liquids to pyriform sinus noted prior to swallow initiation, causing laryngeal penetration. Lingual base retraction impaired as well as pharyngeal squeeze resulting in decreased epiglottic deflection with moderate vallecular region noted post-swallow. Pt senses retention and was able to both expectorate per cue and swallow thin liquids to transit into esophagus. She did have episodic trace aspiration and laryngeal penetration of thin liquids and secretions. Chin tuck posture was unable to be performed adequately due to discomfort, however head turn left improved pharyngeal squeeze and resulted in less retention. Pt also noted to have neck extension posture t/o study. Pt did aspirate small amount of nectar thick liquid when swallowing barium tablet - without cough response. Deeper aspirate were not cleared with cued cough.    Upon esophageal sweep, pt appeared with retention with barium tablet at distal region without sensation. Several boluses of nectar and pudding did  not appear to transit tablet into stomach - radiologist not present - suspect component of esophageal dysphagia -suspect due to both her scoliosis and MS. Prominent PES noted that did not impact barium flow.   At this time; recommend thin liquids via tsp anytime, slightly thicker drinks and solids with head turn left, follow solid with liquids. Occasional cough following liquid swallow and expectoration following meals or if sense pharyngeal retention unable to clear. Medicine with nectar small sips *consider crushed if not contraindicated. Follow up for SLP active dysphagia treatment advised to address lingual base/pharyngeal strengthening. Using teach back and live video, pt educated to findings/recommendations. Factors that may increase risk of adverse event in presence of aspiration [employer] 2021): Limited mobility;Frail or deconditioned;Dependence for feeding and/or oral hygiene;Reduced cognitive function  Swallow Evaluation Recommendations Recommendations: PO diet PO Diet Recommendation: Dysphagia 3 (Mechanical soft);Dysphagia 2 (Finely chopped);Full liquid diet;Mildly thick liquids (Level 2, nectar thick);Thin liquids (Level 0) Liquid Administration via: Straw;Spoon;Cup Medication Administration:  (crush with nectar) Supervision: Patient able to self-feed Swallowing strategies  : effortful swallow;Head tilt left during swallowing;Hard cough after swallowing;Follow solids with liquids;Multiple dry swallows after each bite/sip Postural changes: Position pt fully upright for meals;Stay upright 30-60 min after meals Oral care recommendations: Oral care BID (2x/day) Caregiver Recommendations: Have oral suction available      [name] 12/09/2022,10:28 AM

## 2022-12-09 NOTE — Progress Notes (Signed)
   12/09/22 1100  SLP Visit Information  SLP Received On 12/09/22  General Information  Behavior/Cognition Lethargic/Drowsy  Patient Positioning Partially reclined  Oral care provided N/A  HPI pt is an 82 yo female adm to [facility] with hypotension and weakness - diagnosed with UTI, pna, and  sepsis,  Pt with PMH + for MS, neurogenic bladder s/p supra pubic cathether, tremor, seizures, dysphagia with silent aspiration, h/o falls, pna, resides with her spouse, [name].  She has a caregiver at home.  Swallow eval ordered.  CXR concerning for potential pna.  Dysphagia Treatment  Temperature Spikes Noted No  Respiratory Status Supplemental O2 delivered via (comment)  Oral Cavity - Dentition Adequate natural dentition  Treatment Methods Patient/caregiver education  Family/Caregiver Educated spouse  Patient observed directly with PO's No  Other treatment/comments Follow up to educate spouse regarding MBS findings as he was unable to be present for MBS.  Using teach back and providing written copy of short MBS report as well as swallow precaution signs, educated him.  Discussed that there are risks and benefits to every care plan.  Preventing aspiration fully is not realistic for this pt.  Will require monitoring of lung sounds, temperatures, etc to assure tolerance of aspiration.  Spouse was agreeable to plan - will follow up with pt.  Reason PO's not observed Lethargic  SLP - End of Session  Patient left with call bell/phone within reach;with bed alarm set;with family/visitor present;in bed  Nurse Communication Aspiration precautions reviewed;Diet recommendation;Swallow strategies reviewed;Plan for instrumental testing;Treatment plan  Assessment / Recommendations / Plan  Plan Continue with current plan of care;MBS  Dysphagia Recommendations  Diet recommendations Dysphagia 3 (mechanical soft);Thin liquid  Liquids provided via Cup;Straw  Medication Administration Other (Comment)  Supervision Staff to  assist with self feeding  Compensations Slow rate;Small sips/bites;Follow solids with liquid  Postural Changes and/or Swallow Maneuvers Seated upright 90 degrees;Upright 30-60 min after meal  General Recommendations  Oral Care Recommendations Oral care QID  Follow Up Recommendations No SLP follow up  Assistance recommended at discharge None  SLP Visit Diagnosis Dysphagia, oropharyngeal phase (R13.12)  Progression Toward Goals  Progression toward goals Progressing toward goals  SLP Time Calculation  SLP Start Time (ACUTE ONLY) 1059  SLP Stop Time (ACUTE ONLY) 1109  SLP Time Calculation (min) (ACUTE ONLY) 10 min  SLP Evaluations  $ SLP Speech Visit 1 Visit  SLP Evaluations  $Self Care/Home Management 8-22   [name], MS [facility] SLP Acute Rehab Services Office [phone]

## 2022-12-09 NOTE — Plan of Care (Signed)

## 2022-12-09 NOTE — Plan of Care (Signed)
  Problem: Clinical Measurements: Goal: Ability to maintain clinical measurements within normal limits will improve Outcome: Progressing Goal: Will remain free from infection Outcome: Progressing Goal: Diagnostic test results will improve Outcome: Progressing   Problem: Nutrition: Goal: Adequate nutrition will be maintained Outcome: Progressing   Problem: Coping: Goal: Level of anxiety will decrease Outcome: Progressing   

## 2022-12-09 NOTE — Progress Notes (Addendum)
Pt told this nurse she is allergic to eggs while eating her scrambled eggs this morning. This nurse told her to stop eating them and pt stated, "I'm not, I'm eating the shrimp". This nurse removed the eggs from the plate and told pt she doesn't have any shrimp and those are eggs. MD notified and eggs will be added to pt's allergy list until patient's husband is here to confirm or deny pt's allergy, as pt also believes she is at a spa at [employer].

## 2022-12-10 DIAGNOSIS — A419 Sepsis, unspecified organism: Secondary | ICD-10-CM | POA: Diagnosis not present

## 2022-12-10 DIAGNOSIS — Z7189 Other specified counseling: Secondary | ICD-10-CM | POA: Diagnosis not present

## 2022-12-10 DIAGNOSIS — J189 Pneumonia, unspecified organism: Secondary | ICD-10-CM | POA: Diagnosis not present

## 2022-12-10 DIAGNOSIS — Z515 Encounter for palliative care: Secondary | ICD-10-CM | POA: Diagnosis not present

## 2022-12-10 DIAGNOSIS — E872 Acidosis, unspecified: Secondary | ICD-10-CM | POA: Diagnosis not present

## 2022-12-10 DIAGNOSIS — Z66 Do not resuscitate: Secondary | ICD-10-CM

## 2022-12-10 DIAGNOSIS — Z9359 Other cystostomy status: Secondary | ICD-10-CM | POA: Diagnosis not present

## 2022-12-10 LAB — CBC
HCT: 29.4 % — ABNORMAL LOW (ref 36.0–46.0)
Hemoglobin: 9.6 g/dL — ABNORMAL LOW (ref 12.0–15.0)
MCH: 28.6 pg (ref 26.0–34.0)
MCHC: 32.7 g/dL (ref 30.0–36.0)
MCV: 87.5 fL (ref 80.0–100.0)
Platelets: 249 10*3/uL (ref 150–400)
RBC: 3.36 MIL/uL — ABNORMAL LOW (ref 3.87–5.11)
RDW: 14.6 % (ref 11.5–15.5)
WBC: 11.5 10*3/uL — ABNORMAL HIGH (ref 4.0–10.5)
nRBC: 0 % (ref 0.0–0.2)

## 2022-12-10 LAB — BASIC METABOLIC PANEL
Anion gap: 11 (ref 5–15)
BUN: 5 mg/dL — ABNORMAL LOW (ref 8–23)
CO2: 22 mmol/L (ref 22–32)
Calcium: 7.9 mg/dL — ABNORMAL LOW (ref 8.9–10.3)
Chloride: 97 mmol/L — ABNORMAL LOW (ref 98–111)
Creatinine, Ser: <0.3 mg/dL — ABNORMAL LOW (ref 0.44–1.00)
Glucose, Bld: 107 mg/dL — ABNORMAL HIGH (ref 70–99)
Potassium: 3.6 mmol/L (ref 3.5–5.1)
Sodium: 130 mmol/L — ABNORMAL LOW (ref 135–145)

## 2022-12-10 LAB — LEGIONELLA PNEUMOPHILA SEROGP 1 UR AG: L. pneumophila Serogp 1 Ur Ag: NEGATIVE

## 2022-12-10 LAB — CULTURE, BLOOD (ROUTINE X 2)

## 2022-12-10 MED ORDER — HYDRALAZINE HCL 25 MG PO TABS
25.0000 mg | ORAL_TABLET | Freq: Four times a day (QID) | ORAL | Status: DC | PRN
Start: 2022-12-10 — End: 2022-12-11
  Administered 2022-12-10: 25 mg via ORAL
  Filled 2022-12-10: qty 1

## 2022-12-10 MED ORDER — LISINOPRIL 20 MG PO TABS
20.0000 mg | ORAL_TABLET | Freq: Every day | ORAL | Status: DC
Start: 2022-12-10 — End: 2022-12-11
  Administered 2022-12-10 – 2022-12-11 (×2): 20 mg via ORAL
  Filled 2022-12-10: qty 1
  Filled 2022-12-10: qty 2

## 2022-12-10 MED ORDER — AMLODIPINE BESYLATE 5 MG PO TABS
5.0000 mg | ORAL_TABLET | Freq: Every day | ORAL | Status: DC
  Administered 2022-12-10 – 2022-12-11 (×2): 5 mg via ORAL
  Filled 2022-12-10 (×2): qty 1

## 2022-12-10 MED ORDER — CEFADROXIL 500 MG PO CAPS
500.0000 mg | ORAL_CAPSULE | Freq: Two times a day (BID) | ORAL | Status: DC
  Administered 2022-12-10 – 2022-12-11 (×3): 500 mg via ORAL
  Filled 2022-12-10 (×4): qty 1

## 2022-12-10 NOTE — Plan of Care (Signed)

## 2022-12-10 NOTE — Progress Notes (Signed)
  Daily Progress Note   Patient Name: Kelly Cole       Date: 12/10/2022 DOB: 01-06-1941  Age: 82 y.o. MRN#: [id number] Attending Physician: Kathlen Mody, MD Primary Care Physician: Merlene Laughter, MD (Inactive) Admit Date: 12/07/2022 Length of Stay: 2 days  Reason for Consultation/Follow-up: {Reason for Consult:23484}  HPI/Patient Profile:  ***  Subjective:   Subjective: Chart Reviewed. Updates received. Patient Assessed. Created space and opportunity for patient  and family to explore thoughts and feelings regarding current medical situation.  Today's Discussion: ***  Review of Systems  Objective:   Vital Signs:  BP (!) 177/56   Pulse 69   Temp 98.5 F (36.9 C) (Oral)   Resp 15   Ht 5' 5.5" (1.664 m)   Wt 61.2 kg   SpO2 98%   BMI 22.12 kg/m   Physical Exam: Physical Exam  Palliative Assessment/Data: ***    Existing Vynca/ACP Documentation: ***  Assessment & Plan:   Impression: Present on Admission:  Multiple sclerosis (HCC)  Essential hypertension  Scoliosis  Essential tremor  UTI (urinary tract infection) due to urinary indwelling catheter (HCC)  CAP (community acquired pneumonia)  Septic shock (HCC)  Anemia  Hyponatremia  Sepsis (HCC)  Hypomagnesemia  Lactic acidosis  ***  SUMMARY OF RECOMMENDATIONS   ***  Symptom Management:  ***  Code Status: {Palliative Code status:23503}  Prognosis: {Palliative Care Prognosis:23504}  Discharge Planning: {Palliative dispostion:23505}  Discussed with: ***  Thank you for allowing [place] to participate in the care of Kelly Cole PMT will continue to support holistically.  Time Total: ***  Detailed review of medical records (labs, imaging, vital signs), medically appropriate exam, discussed with treatment team, counseling and education to patient, family, & staff, documenting clinical information, medication management, coordination of care  Wynne Dust, NP Palliative Medicine Team  Team Phone #  [phone] (Nights/Weekends)  10/07/2020, 8:17 AM

## 2022-12-10 NOTE — Plan of Care (Signed)
  Problem: Fluid Volume: Goal: Hemodynamic stability will improve Outcome: Progressing   Problem: Clinical Measurements: Goal: Diagnostic test results will improve Outcome: Progressing Goal: Signs and symptoms of infection will decrease Outcome: Progressing   Problem: Respiratory: Goal: Ability to maintain adequate ventilation will improve Outcome: Progressing   Problem: Clinical Measurements: Goal: Ability to maintain clinical measurements within normal limits will improve Outcome: Progressing Goal: Will remain free from infection Outcome: Progressing Goal: Respiratory complications will improve Outcome: Progressing Goal: Cardiovascular complication will be avoided Outcome: Progressing

## 2022-12-10 NOTE — Progress Notes (Signed)
[facility]                                                                               Kelly Cole, is a 82 y.o. female, DOB - 01/19/1941, UJW:[id number] Admit date - 12/07/2022    Outpatient Primary MD for the patient is Kelly Laughter, MD (Inactive)  LOS - 2  days    Brief summary   Kelly Cole is a 82 y.o. female with medical history significant of MS, HTN, neurogenic bladder status post suprapubic catheter , history of seizure  presented to ED for acute metabolic encephalopathy.  Assessment & Plan    Assessment and Plan:  Severe sepsis:  On arrival to ED, she was afebrile, tachycardic 111/min, tachypnea 29/min, and hypotensive 86/22 mmhg, with leukocytosis of 17,000, lactic acid level of 4. Altered mentation on admission, was found to have UTI with indwelling supra pubic catheter, and Pneumonia with gram negative bacteremia.  Started her on IV ceftriaxone and IV Azithromycin.  Leukocytosis improving.  Blood cultures showing  e coli and staph species, staph species appears contaminant.  Repeat blood cultures ordered and negative so far.  E coli sensitive to Augmentin.  Transition to  cefadroxil  to complete the course.  Lactic acid level improved.  Influenza A, B, COVID PCR is negative. RSV is negative.  Pro calcitonin is negative.      Acute metabolic encephalopathy from UTI and pneumonia.  Improving. She is alert and oriented to person only . She appears to be at baseline. Confirmed by husband. SLP evaluation recommending dysphagia 3 diet.  MBS done today and recommendations given.    Hypomagnesemia and hypophosphatemia:  Replaced and repeat level wnl.    Hyponatremia:  Probably from hypovolemic and dehydration.  Sodium on admission 119  improved to 128  to 131 to 128 to 130 today.    Vascular congestion on CXR Echocardiogram showed LVEF of 60% to 65 %, Left ventricular  diastolic parameters are  indeterminate.   Anemia of chronic  disease in addition to iron deficiency and folate deficiency anemia.  Iron and folate supplementation added.   Hypertension Blood pressure parameters not well controlled.  Restarted home meds.  Continue to monitor   History of multiple sclerosis Resume home meds.   Therapy evaluations done and recommendation given.  Palliative care consulted and recommendations given.    Estimated body mass index is 22.12 kg/m as calculated from the following:   Height as of this encounter: 5' 5.5" (1.664 m).   Weight as of this encounter: 61.2 kg.  Code Status: DNR Limited.  DVT Prophylaxis:  SCDs Start: 12/08/22 0100   Level of Care: Level of care: Med-Surg Family Communication: None at bedside.   Disposition Plan:     Remains inpatient appropriate:  IV antibiotics.  Procedures:  None.   Consultants:   Palliative care   Antimicrobials:   Anti-infectives (From admission, onward)    Start     Dose/Rate Route Frequency Ordered Stop   12/09/22 1000  vancomycin (VANCOREADY) IVPB 1250 mg/250 mL  Status:  Discontinued        1,250 mg 166.7 mL/hr over 90 Minutes Intravenous Every 36 hours  12/07/22 2350 12/08/22 1035   12/08/22 1800  azithromycin (ZITHROMAX) 500 mg in sodium chloride 0.9 % 250 mL IVPB        500 mg 250 mL/hr over 60 Minutes Intravenous Every 24 hours 12/07/22 2052 12/09/22 1903   12/08/22 1600  cefTRIAXone (ROCEPHIN) 2 g in sodium chloride 0.9 % 100 mL IVPB        2 g 200 mL/hr over 30 Minutes Intravenous Every 24 hours 12/08/22 1039     12/08/22 0400  ceFEPIme (MAXIPIME) 2 g in sodium chloride 0.9 % 100 mL IVPB  Status:  Discontinued        2 g 200 mL/hr over 30 Minutes Intravenous Every 12 hours 12/07/22 2224 12/08/22 1035   12/08/22 0000  ceFEPIme (MAXIPIME) 2 g in sodium chloride 0.9 % 100 mL IVPB  Status:  Discontinued        2 g 200 mL/hr over 30 Minutes Intravenous Every 8 hours 12/07/22 2125 12/07/22 2224   12/07/22 2215  vancomycin (VANCOCIN) IVPB 1000  mg/200 mL premix        1,000 mg 200 mL/hr over 60 Minutes Intravenous  Once 12/07/22 2209 12/07/22 2352   12/07/22 2030  azithromycin (ZITHROMAX) 500 mg in sodium chloride 0.9 % 250 mL IVPB        500 mg 250 mL/hr over 60 Minutes Intravenous  Once 12/07/22 2019 12/07/22 2259   12/07/22 1600  ceFEPIme (MAXIPIME) 2 g in sodium chloride 0.9 % 100 mL IVPB        2 g 200 mL/hr over 30 Minutes Intravenous  Once 12/07/22 1549 12/07/22 1653        Medications  Scheduled Meds:  amLODipine  5 mg Oral Daily   busPIRone  15 mg Oral BID   Chlorhexidine Gluconate Cloth  6 each Topical Daily   cholestyramine light  4 g Oral QID   feeding supplement (KATE [employer]ARMS STANDARD 1.4)  325 mL Oral BID BM   fluticasone  1 spray Each Nare Daily   folic acid  1 mg Oral Daily   lamoTRIgine  25 mg Oral BID   lisinopril  20 mg Oral Daily   multivitamin with minerals  1 tablet Oral Daily   polyethylene glycol  17 g Oral Daily   propranolol  40 mg Oral BID   QUEtiapine  50 mg Oral QHS   senna-docusate  2 tablet Oral BID   venlafaxine  100 mg Oral TID WC   Continuous Infusions:  cefTRIAXone (ROCEPHIN)  IV Stopped (12/09/22 1702)   PRN Meds:.acetaminophen **OR** acetaminophen, food thickener, hydrALAZINE, LORazepam, ondansetron **OR** ondansetron (ZOFRAN) IV    Subjective:   Kelly Cole was seen and examined today. Alert and no complaints.   Objective:   Vitals:   12/10/22 0900 12/10/22 1200 12/10/22 1300 12/10/22 1309  BP:  (!) 177/56  (!) 177/56  Pulse: 80 66 69   Resp:  15 15   Temp:  98.5 F (36.9 C)    TempSrc:  Oral    SpO2: 94% 96% 98%   Weight:      Height:        Intake/Output Summary (Last 24 hours) at 12/10/2022 1440 Last data filed at 12/10/2022 1030 Gross per 24 hour  Intake 531.13 ml  Output 2150 ml  Net -1618.87 ml   Filed Weights   12/07/22 1428  Weight: 61.2 kg     Exam General exam: Appears calm and comfortable  Respiratory system: Clear to auscultation.  Respiratory  effort normal. Cardiovascular system: S1 & S2 heard, RRR. No JVD,  Gastrointestinal system: Abdomen is nondistended, soft and nontender.  Central nervous system: Alert and oriented to person and place. Extremities: Symmetric 5 x 5 power. Skin: No rashes, Psychiatry: Mood & affect appropriate.      Data Reviewed:  I have personally reviewed following labs and imaging studies   CBC Lab Results  Component Value Date   WBC 11.5 (H) 12/10/2022   RBC 3.36 (L) 12/10/2022   HGB 9.6 (L) 12/10/2022   HCT 29.4 (L) 12/10/2022   MCV 87.5 12/10/2022   MCH 28.6 12/10/2022   PLT 249 12/10/2022   MCHC 32.7 12/10/2022   RDW 14.6 12/10/2022   LYMPHSABS 0.7 12/07/2022   MONOABS 1.0 12/07/2022   EOSABS 0.1 12/07/2022   BASOSABS 0.0 12/07/2022     Last metabolic panel Lab Results  Component Value Date   NA 130 (L) 12/10/2022   K 3.6 12/10/2022   CL 97 (L) 12/10/2022   CO2 22 12/10/2022   BUN 5 (L) 12/10/2022   CREATININE <0.30 (L) 12/10/2022   GLUCOSE 107 (H) 12/10/2022   GFRNONAA NOT CALCULATED 12/10/2022   GFRAA 52 (L) 07/13/2019   CALCIUM 7.9 (L) 12/10/2022   PHOS 3.2 12/08/2022   PROT 4.8 (L) 12/08/2022   ALBUMIN 2.6 (L) 12/08/2022   BILITOT 0.3 12/08/2022   ALKPHOS 52 12/08/2022   AST 13 (L) 12/08/2022   ALT 10 12/08/2022   ANIONGAP 11 12/10/2022    CBG (last 3)  No results for input(s): "GLUCAP" in the last 72 hours.    Coagulation Profile: Recent Labs  Lab 12/07/22 1730  INR 1.0     Radiology Studies: DG Swallowing Func-Speech Pathology  Result Date: 12/09/2022 Table formatting from the original result was not included. Modified Barium Swallow Study Patient Details Name: Kelly Cole MRN: [id number] Date of Birth: 10-Sep-1940 Today's Date: 12/09/2022 HPI/PMH: HPI: pt is an 82 yo female adm to [facility] with hypotension and weakness - diagnosed with UTI, pna, and  sepsis,  Pt with PMH + for MS, neurogenic bladder s/p supra pubic cathether, tremor, seizures,  dysphagia with silent aspiration, h/o falls, pna, resides with her spouse, [name].  She has a caregiver at home.  Swallow eval ordered.  CXR concerning for potential pna. Clinical Impression: Clinical Impression: Pt continues with moderate oropharyngeal dysphagia resulting in decreased propulsive effort in transiting solids *cracker bolus* into pharynx - with resultant lingual pumping of tongue base x 4-6 to transit masticated cracker into pharynx to vallecular region. In addition, premature spillage of thin liquids to pyriform sinus noted prior to swallow initiation, causing laryngeal penetration. Lingual base retraction impaired as well as pharyngeal squeeze resulting in decreased epiglottic deflection with moderate vallecular region noted post-swallow. Pt senses retention and was able to both expectorate per cue and swallow thin liquids to transit into esophagus. She did have episodic trace aspiration and laryngeal penetration of thin liquids and secretions. Chin tuck posture was unable to be performed adequately due to discomfort, however head turn left improved pharyngeal squeeze and resulted in less retention. Pt also noted to have neck extension posture t/o study. Pt did aspirate small amount of nectar thick liquid when swallowing barium tablet - without cough response. Deeper aspirate were not cleared with cued cough. Upon esophageal sweep, pt appeared with retention with barium tablet at distal region without sensation. Several boluses of nectar and pudding did not appear to transit tablet into stomach - radiologist not present -  suspect component of esophageal dysphagia -suspect due to both her scoliosis and MS. Prominent PES noted that did not impact barium flow. At this time; recommend thin liquids via tsp anytime, slightly thicker drinks and solids with head turn left, follow solid with liquids. Occasional cough following liquid swallow and expectoration following meals or if sense pharyngeal retention  unable to clear. Medicine with nectar small sips *consider crushed if not contraindicated. Follow up for SLP active dysphagia treatment advised to address lingual base/pharyngeal strengthening. Using teach back and live video, pt educated to findings/recommendations. Factors that may increase risk of adverse event in presence of aspiration Rubye Oaks & Clearance Coots 2021): Factors that may increase risk of adverse event in presence of aspiration Rubye Oaks & Clearance Coots 2021): Limited mobility; Frail or deconditioned; Dependence for feeding and/or oral hygiene; Reduced cognitive function Recommendations/Plan: Swallowing Evaluation Recommendations Swallowing Evaluation Recommendations Recommendations: PO diet PO Diet Recommendation: Dysphagia 3 (Mechanical soft); Dysphagia 2 (Finely chopped); Full liquid diet; Mildly thick liquids (Level 2, nectar thick); Thin liquids (Level 0) Liquid Administration via: Straw; Spoon; Cup Medication Administration: -- (crush with nectar) Supervision: Patient able to self-feed Swallowing strategies  : effortful swallow; Head tilt left during swallowing; Hard cough after swallowing; Follow solids with liquids; Multiple dry swallows after each bite/sip Postural changes: Position pt fully upright for meals; Stay upright 30-60 min after meals Oral care recommendations: Oral care BID (2x/day) Caregiver Recommendations: Have oral suction available Treatment Plan Treatment Plan Treatment recommendations: Therapy as outlined in treatment plan below Follow-up recommendations: Home health SLP Functional status assessment: Patient has had a recent decline in their functional status and demonstrates the ability to make significant improvements in function in a reasonable and predictable amount of time. Treatment frequency: Min 1x/week Treatment duration: 1 week Interventions: Aspiration precaution training; Patient/family education; Respiratory muscle strength training; Diet toleration management by SLP;  Compensatory techniques Recommendations Recommendations for follow up therapy are one component of a multi-disciplinary discharge planning process, led by the attending physician.  Recommendations may be updated based on patient status, additional functional criteria and insurance authorization. Assessment: Orofacial Exam: Orofacial Exam Oral Cavity: Oral Hygiene: Xerostomia Oral Cavity - Dentition: Adequate natural dentition Orofacial Anatomy: WFL Oral Motor/Sensory Function: WFL Anatomy: Anatomy: Prominent cricopharyngeus Boluses Administered: Boluses Administered Boluses Administered: Thin liquids (Level 0); Mildly thick liquids (Level 2, nectar thick); Moderately thick liquids (Level 3, honey thick); Puree; Solid  Oral Impairment Domain: Oral Impairment Domain Lip Closure: No labial escape Tongue control during bolus hold: Posterior escape of less than half of bolus Bolus preparation/mastication: Disorganized chewing/mashing with solid pieces of bolus unchewed Bolus transport/lingual motion: Repetitive/disorganized tongue motion (with solids) Oral residue: Trace residue lining oral structures Location of oral residue : Tongue Initiation of pharyngeal swallow : Pyriform sinuses; Valleculae  Pharyngeal Impairment Domain: Pharyngeal Impairment Domain Soft palate elevation: No bolus between soft palate (SP)/pharyngeal wall (PW) Laryngeal elevation: Partial superior movement of thyroid cartilage/partial approximation of arytenoids to epiglottic petiole Anterior hyoid excursion: Complete anterior movement Epiglottic movement: Partial inversion Laryngeal vestibule closure: Incomplete, narrow column air/contrast in laryngeal vestibule Pharyngeal stripping wave : Present - diminished Pharyngeal contraction (A/P view only): N/A Pharyngoesophageal segment opening: Partial distention/partial duration, partial obstruction of flow Tongue base retraction: Narrow column of contrast or air between tongue base and PPW Pharyngeal  residue: Collection of residue within or on pharyngeal structures Location of pharyngeal residue: Valleculae; Pyriform sinuses  Esophageal Impairment Domain: Esophageal Impairment Domain Esophageal clearance upright position: Esophageal retention Pill: Pill Consistency administered: Mildly thick liquids (  Level 2, nectar thick) Mildly thick liquids (Level 2, nectar thick): Impaired (see clinical impressions) Penetration/Aspiration Scale Score: Penetration/Aspiration Scale Score 1.  Material does not enter airway: Moderately thick liquids (Level 3, honey thick); Puree; Solid; Pill 2.  Material enters airway, remains ABOVE vocal cords then ejected out: Mildly thick liquids (Level 2, nectar thick) 3.  Material enters airway, remains ABOVE vocal cords and not ejected out: Thin liquids (Level 0) 8.  Material enters airway, passes BELOW cords without attempt by patient to eject out (silent aspiration) : Mildly thick liquids (Level 2, nectar thick); Thin liquids (Level 0) Compensatory Strategies: Compensatory Strategies Compensatory strategies: Yes Effortful swallow: Ineffective Ineffective Effortful Swallow: Puree; Solid Multiple swallows: Effective Effective Multiple Swallows: Mildly thick liquid (Level 2, nectar thick); Moderately thick liquid (Level 3, honey thick) Chin tuck: Ineffective (inadequately performed due to discomfort) Liquid wash: Effective Effective Liquid Wash: Solid Left head turn: Effective Effective Left Head Turn: Solid   General Information: Caregiver present: No  Diet Prior to this Study: Dysphagia 3 (mechanical soft); Mildly thick liquids (Level 2, nectar thick)   Temperature : Normal   Respiratory Status: WFL   Supplemental O2: None (Room air)   History of Recent Intubation: No  Behavior/Cognition: Alert; Cooperative; Pleasant mood Self-Feeding Abilities: Needs hand-over-hand assist for feeding Baseline vocal quality/speech: Normal Volitional Cough: Able to elicit Volitional Swallow: Unable to  elicit Exam Limitations: No limitations Goal Planning: Prognosis for improved oropharyngeal function: Fair Barriers to Reach Goals: Time post onset Barriers/Prognosis Comment: progressive nature of disease process Patient/Family Stated Goal: none stated Consulted and agree with results and recommendations: Patient Pain: Pain Assessment Pain Assessment: No/denies pain End of Session: Start Time:SLP Start Time (ACUTE ONLY): 0829 Stop Time: SLP Stop Time (ACUTE ONLY): 0911 Time Calculation:SLP Time Calculation (min) (ACUTE ONLY): 42 min Charges: SLP Evaluations $ SLP Speech Visit: 1 Visit SLP Evaluations $BSS Swallow: 1 Procedure $MBS Swallow: 1 Procedure $Swallowing Treatment: 1 Procedure SLP visit diagnosis: SLP Visit Diagnosis: Dysphagia, oropharyngeal phase (R13.12) Past Medical History: Past Medical History: Diagnosis Date  HTN (hypertension)   Multiple sclerosis (HCC)  Past Surgical History: Past Surgical History: Procedure Laterality Date  ABDOMINAL HYSTERECTOMY    APPENDECTOMY    CHOLECYSTECTOMY    HYSTERCTOMY   [name] 12/09/2022, 10:27 AM      Kathlen Mody M.D. [facility] 12/10/2022, 2:40 PM  Available via Epic secure chat 7am-7pm After 7 pm, please refer to night coverage provider listed on amion.

## 2022-12-11 DIAGNOSIS — I10 Essential (primary) hypertension: Secondary | ICD-10-CM | POA: Diagnosis not present

## 2022-12-11 DIAGNOSIS — E871 Hypo-osmolality and hyponatremia: Secondary | ICD-10-CM | POA: Diagnosis not present

## 2022-12-11 DIAGNOSIS — J189 Pneumonia, unspecified organism: Secondary | ICD-10-CM | POA: Diagnosis not present

## 2022-12-11 DIAGNOSIS — A4151 Sepsis due to Escherichia coli [E. coli]: Secondary | ICD-10-CM | POA: Diagnosis not present

## 2022-12-11 MED ORDER — FOLIC ACID 1 MG PO TABS: 1.0000 mg | ORAL_TABLET | Freq: Every day | ORAL | 2 refills | Status: AC

## 2022-12-11 MED ORDER — CEFADROXIL 500 MG PO CAPS: 500.0000 mg | ORAL_CAPSULE | Freq: Two times a day (BID) | ORAL | 0 refills | Status: AC

## 2022-12-11 MED ORDER — KATE FARMS STANDARD 1.4 PO LIQD: 325.0000 mL | Freq: Two times a day (BID) | ORAL | 1 refills | Status: AC

## 2022-12-11 MED ORDER — ADULT MULTIVITAMIN W/MINERALS CH: 1.0000 | ORAL_TABLET | Freq: Every day | ORAL | Status: AC

## 2022-12-11 NOTE — TOC Transition Note (Signed)
Transition of Care [facility]) - CM/SW Discharge Note   Patient Details  Name: Kelly Cole MRN: [id number] Date of Birth: [birth year]  [facility] CM/SW Contact:  Adrian Prows, RN Phone Number: 12/11/2022, 12:13 PM   Clinical Narrative:    D/C orders received; spoke to pt and husband Kelly Cole at bedside; they agree to transport by [facility]; verified d/c address [address] [place], [place] [address]; [name] would like to know ETA; explained can not given; [facility] called at 1213; spoke w/ operator # [phone]; she says there are 5 transport ahead of this one; pt and husband notified; no TOC needs.   Final next level of care: Home/Self Care Barriers to Discharge: No Barriers Identified   Patient Goals and CMS Choice      Discharge Placement                      Patient and family notified of of transfer: 12/11/22  Discharge Plan and Services Additional resources added to the After Visit Summary for                                       Social Determinants of Health (SDOH) Interventions SDOH Screenings   Food Insecurity: No Food Insecurity (12/08/2022)  Housing: Low Risk  (12/08/2022)  Transportation Needs: Patient Unable To Answer (12/09/2022)  Recent Concern: Transportation Needs - Unmet Transportation Needs (12/08/2022)  Utilities: Not At Risk (12/08/2022)  Depression (PHQ2-9): Low Risk  (08/27/2019)  Tobacco Use: Low Risk  (12/07/2022)     Readmission Risk Interventions     No data to display

## 2022-12-12 NOTE — Discharge Summary (Signed)
Physician Discharge Summary   Patient: Kelly Cole MRN: [id number] DOB: 02/02/1941  Admit date:     12/07/2022  Discharge date: 12/11/2022  Discharge Physician: Kathlen Mody   PCP: Merlene Laughter, MD (Inactive)   Recommendations at discharge:  Please follow up with PCP in one week.  Please follow up with cbc and bmp in one week.   Discharge Diagnoses: Principal Problem:   Sepsis (HCC) Active Problems:   Multiple sclerosis (HCC)   Scoliosis   Essential hypertension   Essential tremor   Suprapubic catheter (HCC)   Seizure (HCC)   Hypomagnesemia   UTI (urinary tract infection) due to urinary indwelling catheter (HCC)   CAP (community acquired pneumonia)   Septic shock (HCC)   Anemia   Hyponatremia   Lactic acidosis   Hospital Course:  Kelly Cole is a 82 y.o. female with medical history significant of MS, HTN, neurogenic bladder status post suprapubic catheter , history of seizure presented to ED for acute metabolic encephalopathy.   Assessment and Plan:  Severe sepsis:  On arrival to ED, she was afebrile, tachycardic 111/min, tachypnea 29/min, and hypotensive 86/22 mmhg, with leukocytosis of 17,000, lactic acid level of 4. Altered mentation on admission, was found to have UTI with indwelling supra pubic catheter, and Pneumonia with gram negative bacteremia.  Started her on IV ceftriaxone and IV Azithromycin. Transitioned to cefadroxil.  Blood cultures showing  e coli and staph species, staph species appears contaminant.  Repeat blood cultures ordered and negative so far.  E coli pan sensitive.  Transition to  cefadroxil  to complete the course.  Lactic acid level improved.  Influenza A, B, COVID PCR is negative. RSV is negative.  Pro calcitonin is negative.        3 mm stone lodged in the inner orifice of the right UVJ, and there is mild urothelial thickening of the right renal pelvis, as well as right renal edema with patchy hypoenhancement consistent with  pyelonephritis.  Discussed with Urology Dr Linward Headland PA , recommended no need for intervention at this time.    Acute metabolic encephalopathy from UTI and pneumonia.  Improving. She is alert and oriented to person only . She appears to be at baseline. Confirmed by husband. SLP evaluation recommending dysphagia 3 diet.  MBS done today and recommendations given.      Hypomagnesemia and hypophosphatemia:  Replaced and repeat level wnl.      Hyponatremia:  Probably from hypovolemic and dehydration.  Sodium on admission 119  improved to 128  to 131 to 128 to 130.       Vascular congestion on CXR Echocardiogram showed LVEF of 60% to 65 %, Left ventricular  diastolic parameters are  indeterminate.    Anemia of chronic disease in addition to iron deficiency and folate deficiency anemia.  Iron and folate supplementation added.    Hypertension Blood pressure parameters not well controlled.  Restarted home meds.  Continue to monitor     History of multiple sclerosis Resume home meds.    Therapy evaluations done and recommendation given.  Palliative care consulted and recommendations given.       Estimated body mass index is 22.12 kg/m as calculated from the following:   Height as of this encounter: 5' 5.5" (1.664 m).   Weight as of this encounter: 61.2 kg.  Consultants: palliative care.  Procedures performed: none.   Disposition: Home Diet recommendation:  Discharge Diet Orders (From admission, onward)     Start  Ordered   12/11/22 0000  Diet - low sodium heart healthy        12/11/22 1102           Dysphagia 3 diet.  DISCHARGE MEDICATION: Allergies as of 12/11/2022       Reactions   Demerol  [meperidine] Other (See Comments)   No [employer] Food Allergies    Dairy, gluten, eggs        Medication List     TAKE these medications    acetaminophen 325 MG tablet Commonly known as: TYLENOL Take 2 tablets (650 mg total) by mouth every 6 (six) hours  as needed for mild pain (or Fever >/= 101). What changed: how much to take   amLODipine 5 MG tablet Commonly known as: NORVASC Take 1 tablet (5 mg total) by mouth at bedtime.   busPIRone 15 MG tablet Commonly known as: BUSPAR TAKE 1 TABLET(15 MG) BY MOUTH THREE TIMES DAILY   cefadroxil 500 MG capsule Commonly known as: DURICEF Take 1 capsule (500 mg total) by mouth 2 (two) times daily for 7 days.   cholestyramine light 4 g packet Commonly known as: PREVALITE Take 4 g by mouth 4 (four) times daily.   feeding supplement (KATE FARMS STANDARD 1.4) Liqd liquid Take 325 mLs by mouth 2 (two) times daily between meals.   fluticasone 50 MCG/ACT nasal spray Commonly known as: FLONASE Place 1 spray into both nostrils daily.   folic acid 1 MG tablet Commonly known as: FOLVITE Take 1 tablet (1 mg total) by mouth daily.   food thickener Gel Commonly known as: SIMPLYTHICK (NECTAR/LEVEL 2/MILDLY THICK) Take 1 packet by mouth as needed (for any liquids she drinks). Gluten-free   lamoTRIgine 25 MG tablet Commonly known as: LAMICTAL TAKE 2 TABLETS BY MOUTH TWICE DAILY   lisinopril 40 MG tablet Commonly known as: ZESTRIL Take 40 mg by mouth daily.   loperamide 2 MG capsule Commonly known as: IMODIUM Take 1 capsule (2 mg total) by mouth 3 (three) times daily as needed for diarrhea or loose stools.   LORazepam 0.5 MG tablet Commonly known as: Ativan Take 1 to 2 tablets twice daily as needed for agitation   multivitamin with minerals Tabs tablet Take 1 tablet by mouth daily.   polyethylene glycol powder 17 GM/SCOOP powder Commonly known as: GLYCOLAX/MIRALAX Dissolve 1 capful (17 g) in water and drink daily as needed for mild constipation.   propranolol 40 MG tablet Commonly known as: INDERAL Take 1 tablet (40 mg total) by mouth 2 (two) times daily.   QUEtiapine 25 MG tablet Commonly known as: SEROQUEL Take 50 mg by mouth at bedtime.   temazepam 30 MG capsule Commonly known  as: RESTORIL Take 30 mg by mouth at bedtime as needed for sleep.   venlafaxine 100 MG tablet Commonly known as: EFFEXOR Take 1 tablet (100 mg total) by mouth 3 (three) times daily with meals.        Discharge Exam: Filed Weights   12/07/22 1428  Weight: 61.2 kg   General exam: Appears calm and comfortable  Respiratory system: Clear to auscultation. Respiratory effort normal. Cardiovascular system: S1 & S2 heard, RRR. Gastrointestinal system: Abdomen is nondistended, soft and nontender.  Central nervous system: Alert and oriented. Extremities: Symmetric 5 x 5 power. Skin: No rashes, Psychiatry: Mood & affect appropriate.    Condition at discharge: fair  The results of significant diagnostics from this hospitalization (including imaging, microbiology, ancillary and laboratory) are listed below for reference.  Imaging Studies: DG Swallowing Func-Speech Pathology  Result Date: 12/09/2022 Table formatting from the original result was not included. Modified Barium Swallow Study Patient Details Name: Kelly Cole MRN: [id number] Date of Birth: 10/12/1940 Today's Date: 12/09/2022 HPI/PMH: HPI: pt is an 82 yo female adm to [facility] with hypotension and weakness - diagnosed with UTI, pna, and  sepsis,  Pt with PMH + for MS, neurogenic bladder s/p supra pubic cathether, tremor, seizures, dysphagia with silent aspiration, h/o falls, pna, resides with her spouse, [name].  She has a caregiver at home.  Swallow eval ordered.  CXR concerning for potential pna. Clinical Impression: Clinical Impression: Pt continues with moderate oropharyngeal dysphagia resulting in decreased propulsive effort in transiting solids *cracker bolus* into pharynx - with resultant lingual pumping of tongue base x 4-6 to transit masticated cracker into pharynx to vallecular region. In addition, premature spillage of thin liquids to pyriform sinus noted prior to swallow initiation, causing laryngeal penetration. Lingual base  retraction impaired as well as pharyngeal squeeze resulting in decreased epiglottic deflection with moderate vallecular region noted post-swallow. Pt senses retention and was able to both expectorate per cue and swallow thin liquids to transit into esophagus. She did have episodic trace aspiration and laryngeal penetration of thin liquids and secretions. Chin tuck posture was unable to be performed adequately due to discomfort, however head turn left improved pharyngeal squeeze and resulted in less retention. Pt also noted to have neck extension posture t/o study. Pt did aspirate small amount of nectar thick liquid when swallowing barium tablet - without cough response. Deeper aspirate were not cleared with cued cough. Upon esophageal sweep, pt appeared with retention with barium tablet at distal region without sensation. Several boluses of nectar and pudding did not appear to transit tablet into stomach - radiologist not present - suspect component of esophageal dysphagia -suspect due to both her scoliosis and MS. Prominent PES noted that did not impact barium flow. At this time; recommend thin liquids via tsp anytime, slightly thicker drinks and solids with head turn left, follow solid with liquids. Occasional cough following liquid swallow and expectoration following meals or if sense pharyngeal retention unable to clear. Medicine with nectar small sips *consider crushed if not contraindicated. Follow up for SLP active dysphagia treatment advised to address lingual base/pharyngeal strengthening. Using teach back and live video, pt educated to findings/recommendations. Factors that may increase risk of adverse event in presence of aspiration Rubye Oaks & Clearance Coots 2021): Factors that may increase risk of adverse event in presence of aspiration Rubye Oaks & Clearance Coots 2021): Limited mobility; Frail or deconditioned; Dependence for feeding and/or oral hygiene; Reduced cognitive function Recommendations/Plan: Swallowing  Evaluation Recommendations Swallowing Evaluation Recommendations Recommendations: PO diet PO Diet Recommendation: Dysphagia 3 (Mechanical soft); Dysphagia 2 (Finely chopped); Full liquid diet; Mildly thick liquids (Level 2, nectar thick); Thin liquids (Level 0) Liquid Administration via: Straw; Spoon; Cup Medication Administration: -- (crush with nectar) Supervision: Patient able to self-feed Swallowing strategies  : effortful swallow; Head tilt left during swallowing; Hard cough after swallowing; Follow solids with liquids; Multiple dry swallows after each bite/sip Postural changes: Position pt fully upright for meals; Stay upright 30-60 min after meals Oral care recommendations: Oral care BID (2x/day) Caregiver Recommendations: Have oral suction available Treatment Plan Treatment Plan Treatment recommendations: Therapy as outlined in treatment plan below Follow-up recommendations: Home health SLP Functional status assessment: Patient has had a recent decline in their functional status and demonstrates the ability to make significant improvements in function in a reasonable  and predictable amount of time. Treatment frequency: Min 1x/week Treatment duration: 1 week Interventions: Aspiration precaution training; Patient/family education; Respiratory muscle strength training; Diet toleration management by SLP; Compensatory techniques Recommendations Recommendations for follow up therapy are one component of a multi-disciplinary discharge planning process, led by the attending physician.  Recommendations may be updated based on patient status, additional functional criteria and insurance authorization. Assessment: Orofacial Exam: Orofacial Exam Oral Cavity: Oral Hygiene: Xerostomia Oral Cavity - Dentition: Adequate natural dentition Orofacial Anatomy: WFL Oral Motor/Sensory Function: WFL Anatomy: Anatomy: Prominent cricopharyngeus Boluses Administered: Boluses Administered Boluses Administered: Thin liquids (Level 0);  Mildly thick liquids (Level 2, nectar thick); Moderately thick liquids (Level 3, honey thick); Puree; Solid  Oral Impairment Domain: Oral Impairment Domain Lip Closure: No labial escape Tongue control during bolus hold: Posterior escape of less than half of bolus Bolus preparation/mastication: Disorganized chewing/mashing with solid pieces of bolus unchewed Bolus transport/lingual motion: Repetitive/disorganized tongue motion (with solids) Oral residue: Trace residue lining oral structures Location of oral residue : Tongue Initiation of pharyngeal swallow : Pyriform sinuses; Valleculae  Pharyngeal Impairment Domain: Pharyngeal Impairment Domain Soft palate elevation: No bolus between soft palate (SP)/pharyngeal wall (PW) Laryngeal elevation: Partial superior movement of thyroid cartilage/partial approximation of arytenoids to epiglottic petiole Anterior hyoid excursion: Complete anterior movement Epiglottic movement: Partial inversion Laryngeal vestibule closure: Incomplete, narrow column air/contrast in laryngeal vestibule Pharyngeal stripping wave : Present - diminished Pharyngeal contraction (A/P view only): N/A Pharyngoesophageal segment opening: Partial distention/partial duration, partial obstruction of flow Tongue base retraction: Narrow column of contrast or air between tongue base and PPW Pharyngeal residue: Collection of residue within or on pharyngeal structures Location of pharyngeal residue: Valleculae; Pyriform sinuses  Esophageal Impairment Domain: Esophageal Impairment Domain Esophageal clearance upright position: Esophageal retention Pill: Pill Consistency administered: Mildly thick liquids (Level 2, nectar thick) Mildly thick liquids (Level 2, nectar thick): Impaired (see clinical impressions) Penetration/Aspiration Scale Score: Penetration/Aspiration Scale Score 1.  Material does not enter airway: Moderately thick liquids (Level 3, honey thick); Puree; Solid; Pill 2.  Material enters airway,  remains ABOVE vocal cords then ejected out: Mildly thick liquids (Level 2, nectar thick) 3.  Material enters airway, remains ABOVE vocal cords and not ejected out: Thin liquids (Level 0) 8.  Material enters airway, passes BELOW cords without attempt by patient to eject out (silent aspiration) : Mildly thick liquids (Level 2, nectar thick); Thin liquids (Level 0) Compensatory Strategies: Compensatory Strategies Compensatory strategies: Yes Effortful swallow: Ineffective Ineffective Effortful Swallow: Puree; Solid Multiple swallows: Effective Effective Multiple Swallows: Mildly thick liquid (Level 2, nectar thick); Moderately thick liquid (Level 3, honey thick) Chin tuck: Ineffective (inadequately performed due to discomfort) Liquid wash: Effective Effective Liquid Wash: Solid Left head turn: Effective Effective Left Head Turn: Solid   General Information: Caregiver present: No  Diet Prior to this Study: Dysphagia 3 (mechanical soft); Mildly thick liquids (Level 2, nectar thick)   Temperature : Normal   Respiratory Status: WFL   Supplemental O2: None (Room air)   History of Recent Intubation: No  Behavior/Cognition: Alert; Cooperative; Pleasant mood Self-Feeding Abilities: Needs hand-over-hand assist for feeding Baseline vocal quality/speech: Normal Volitional Cough: Able to elicit Volitional Swallow: Unable to elicit Exam Limitations: No limitations Goal Planning: Prognosis for improved oropharyngeal function: Fair Barriers to Reach Goals: Time post onset Barriers/Prognosis Comment: progressive nature of disease process Patient/Family Stated Goal: none stated Consulted and agree with results and recommendations: Patient Pain: Pain Assessment Pain Assessment: No/denies pain End of Session: Start Time:SLP Start  Time (ACUTE ONLY): 0829 Stop Time: SLP Stop Time (ACUTE ONLY): 0911 Time Calculation:SLP Time Calculation (min) (ACUTE ONLY): 42 min Charges: SLP Evaluations $ SLP Speech Visit: 1 Visit SLP Evaluations $BSS  Swallow: 1 Procedure $MBS Swallow: 1 Procedure $Swallowing Treatment: 1 Procedure SLP visit diagnosis: SLP Visit Diagnosis: Dysphagia, oropharyngeal phase (R13.12) Past Medical History: Past Medical History: Diagnosis Date  HTN (hypertension)   Multiple sclerosis (HCC)  Past Surgical History: Past Surgical History: Procedure Laterality Date  ABDOMINAL HYSTERECTOMY    APPENDECTOMY    CHOLECYSTECTOMY    HYSTERCTOMY   [name] 12/09/2022, 10:27 AM  ECHOCARDIOGRAM COMPLETE  Result Date: 12/08/2022    ECHOCARDIOGRAM REPORT   Patient Name:   Kelly Cole Date of Exam: 12/08/2022 Medical Rec #:  [id number]     Height:       65.5 in Accession #:    [id number]    Weight:       135.0 lb Date of Birth:  15-Apr-1940     BSA:          1.683 m Patient Age:    82 years      BP:           105/51 mmHg Patient Gender: F             HR:           99 bpm. Exam Location:  Inpatient Procedure: 2D Echo, Cardiac Doppler and Color Doppler Indications:    CHF  History:        Patient has no prior history of Echocardiogram examinations.                 Risk Factors:Hypertension.  Sonographer:    Darlys Gales Referring Phys: Herminio Heads IMPRESSIONS  1. Left ventricular ejection fraction, by estimation, is 60 to 65%. The left ventricle has normal function. Left ventricular endocardial border not optimally defined to evaluate regional wall motion. Left ventricular diastolic parameters are indeterminate.  2. Right ventricular systolic function is normal. The right ventricular size is mildly enlarged. There is mildly elevated pulmonary artery systolic pressure. The estimated right ventricular systolic pressure is 40.2 mmHg.  3. The mitral valve is grossly normal. Trivial mitral valve regurgitation. No evidence of mitral stenosis.  4. The aortic valve is grossly normal. Aortic valve regurgitation is not visualized. No aortic stenosis is present.  5. The inferior vena cava is normal in size with greater than 50% respiratory  variability, suggesting right atrial pressure of 3 mmHg. FINDINGS  Left Ventricle: Left ventricular ejection fraction, by estimation, is 60 to 65%. The left ventricle has normal function. Left ventricular endocardial border not optimally defined to evaluate regional wall motion. The left ventricular internal cavity size was normal in size. There is no left ventricular hypertrophy. Left ventricular diastolic parameters are indeterminate. Right Ventricle: The right ventricular size is mildly enlarged. No increase in right ventricular wall thickness. Right ventricular systolic function is normal. There is mildly elevated pulmonary artery systolic pressure. The tricuspid regurgitant velocity is 3.05 m/s, and with an assumed right atrial pressure of 3 mmHg, the estimated right ventricular systolic pressure is 40.2 mmHg. Left Atrium: Left atrial size was not well visualized. Right Atrium: Right atrial size was not well visualized. Pericardium: Trivial pericardial effusion is present. Presence of epicardial fat layer. Mitral Valve: The mitral valve is grossly normal. Trivial mitral valve regurgitation. No evidence of mitral valve stenosis. Tricuspid Valve: The tricuspid valve is grossly normal. Tricuspid valve regurgitation  is mild . No evidence of tricuspid stenosis. Aortic Valve: The aortic valve is grossly normal. Aortic valve regurgitation is not visualized. No aortic stenosis is present. Aortic valve mean gradient measures 4.0 mmHg. Aortic valve peak gradient measures 6.9 mmHg. Aortic valve area, by VTI measures 1.89 cm. Pulmonic Valve: The pulmonic valve was grossly normal. Pulmonic valve regurgitation is not visualized. No evidence of pulmonic stenosis. Aorta: The aortic root and ascending aorta are structurally normal, with no evidence of dilitation. Venous: The inferior vena cava is normal in size with greater than 50% respiratory variability, suggesting right atrial pressure of 3 mmHg. IAS/Shunts: The  interatrial septum was not well visualized. Additional Comments: There is a small pleural effusion in the left lateral region.  LEFT VENTRICLE PLAX 2D LVIDd:         3.90 cm   Diastology LVIDs:         2.60 cm   LV e' medial:    5.44 cm/s LV PW:         0.80 cm   LV E/e' medial:  18.6 LV IVS:        0.90 cm   LV e' lateral:   4.90 cm/s LVOT diam:     1.70 cm   LV E/e' lateral: 20.6 LV SV:         47 LV SV Index:   28 LVOT Area:     2.27 cm  RIGHT VENTRICLE RV S prime:     11.40 cm/s TAPSE (M-mode): 1.5 cm LEFT ATRIUM           Index       RIGHT ATRIUM           Index LA Vol (A2C): 15.9 ml 9.45 ml/m  RA Area:     13.10 cm LA Vol (A4C): 9.5 ml  5.65 ml/m  RA Volume:   32.40 ml  19.25 ml/m  AORTIC VALVE AV Area (Vmax):    2.06 cm AV Area (Vmean):   1.91 cm AV Area (VTI):     1.89 cm AV Vmax:           131.00 cm/s AV Vmean:          97.700 cm/s AV VTI:            0.249 m AV Peak Grad:      6.9 mmHg AV Mean Grad:      4.0 mmHg LVOT Vmax:         119.00 cm/s LVOT Vmean:        82.000 cm/s LVOT VTI:          0.207 m LVOT/AV VTI ratio: 0.83  AORTA Ao Root diam: 3.20 cm Ao Asc diam:  3.50 cm MITRAL VALVE                TRICUSPID VALVE MV Area (PHT): 1.86 cm     TR Peak grad:   37.2 mmHg MV Decel Time: 408 msec     TR Vmax:        305.00 cm/s MV E velocity: 101.00 cm/s MV A velocity: 120.00 cm/s  SHUNTS MV E/A ratio:  0.84         Systemic VTI:  0.21 m                             Systemic Diam: 1.70 cm Lennie Odor MD Electronically signed by Lennie Odor MD Signature Date/Time: 12/08/2022/2:43:56 PM  Final    CT ABDOMEN PELVIS W CONTRAST  Result Date: 12/08/2022 CLINICAL DATA:  Abdominal pain. EXAM: CT ABDOMEN AND PELVIS WITH CONTRAST TECHNIQUE: Multidetector CT imaging of the abdomen and pelvis was performed using the standard protocol following bolus administration of intravenous contrast. RADIATION DOSE REDUCTION: This exam was performed according to the departmental dose-optimization program which  includes automated exposure control, adjustment of the mA and/or kV according to patient size and/or use of iterative reconstruction technique. CONTRAST:  OMNIPAQUE IOHEXOL 300 MG/ML  SOLN COMPARISON:  Portable chest yesterday, and CT of the abdomen and pelvis without contrast 04/09/2020 FINDINGS: Lower chest: There is mild cardiomegaly. Two-vessel calcifications in the LAD and right coronary arteries and a small but increased pericardial effusion are noted. Moderate-sized right and small left layering pleural effusions are present with adjacent consolidation or atelectasis in the lower lobes. There is increased linear atelectasis in the lingular and right middle lobe bases, otherwise, remainder of the more anterior lung bases are otherwise clear. Hepatobiliary: Stable degree of post cholecystectomy common bile duct prominence measuring 9 mm with mild intrahepatic biliary prominence. No ductal filling defect is evident allowing for spray artifact from the cholecystectomy clips. There is an 8 mm too small to characterize hypodensity in the hepatic dome in segment 8, which was seen previously and is probably a small cyst. Remainder of the liver is homogeneous without further focal abnormality. The portal vein is patent and normal in caliber. Pancreas: Diffusely atrophic. No mass enhancement. No ductal dilatation or inflammatory change. Spleen: No abnormality Adrenals/Urinary Tract: There is no adrenal mass. Left kidney is homogeneous. 3 mm calyceal stone noted in the left upper pole, 5 mm stone and 3 mm stone noted in the right upper and lower pole with punctate nonobstructive stones elsewhere in the right upper pole. There is no hydronephrosis, but there is a 3 mm stone lodged in the inner orifice of the right UVJ, and there is mild urothelial thickening of the right renal pelvis, as well as right renal edema with patchy hypoenhancement consistent with pyelonephritis. Slight asymmetric right perinephric edema  also noted. There is a suprapubic bladder catheter in place in the bladder. The bladder is contracted. Perivesical stranding edema suspected along side the bladder compatible with cystitis. Stomach/Bowel: The distal stomach and descending duodenum migrated into the right upper abdominal quadrant due to a prior right hemicolectomy to the mid transverse segment. This was seen previously. There is a patent primary surgical ileocolic anastomosis. Chronic thickened folds again noted in the gastric antrum. The small bowel is normal caliber. The portion of the colon not removed is unremarkable apart from mild fecal stasis and uncomplicated scattered diverticula. Vascular/Lymphatic: Aortic atherosclerosis. No enlarged abdominal or pelvic lymph nodes. Multiple pelvic phleboliths. Reproductive: Status post hysterectomy. No adnexal masses. Other: Minimal nonlocalizing free fluid noted in the right pararenal space, right pericolic gutter and dorsal deep pelvis. No drainable pocket. There is no free air, free hemorrhage or localized collection. 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 No acute or other significant osseous findings. IMPRESSION: 1. 3 mm stone lodged in the inner orifice of the right UVJ without hydronephrosis, but with right renal edema and patchy hypoenhancement consistent with pyelonephritis. 2. Bilateral nephrolithiasis. 3. Suprapubic bladder catheter in place with contracted bladder and perivesical stranding edema compatible with cystitis. 4. Minimal nonlocalizing free fluid in the right pararenal space, right pericolic gutter and dorsal deep  pelvis. 5. Moderate-sized right and small left layering pleural effusions with adjacent consolidation or atelectasis in the lower lobes. 6. Cardiomegaly with small but increased pericardial effusion. 7. Aortic and coronary artery  atherosclerosis. 8. 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 Old right hemicolectomy. 9. Constipation and diverticulosis. 10. Dextroscoliosis with advanced degenerative change in the lumbar spine and right hip. Multilevel severe acquired lumbar spinal stenosis. Aortic Atherosclerosis (ICD10-I70.0). Electronically Signed   By: Almira Bar M.D.   On: 12/08/2022 04:17   DG Chest Portable 1 View  Result Date: 12/07/2022 CLINICAL DATA:  Cough EXAM: PORTABLE CHEST 1 VIEW COMPARISON:  Chest x-ray 09/25/2020 FINDINGS: There is a small right pleural effusion. There some patchy opacities in both lung bases. There central pulmonary vascular congestion and cardiomegaly. There is no pneumothorax. There are calcified pleural plaques in both lung apices. There are healed left-sided rib fractures. No acute fractures are seen. IMPRESSION: 1. Small right pleural effusion. 2. Patchy opacities in both lung bases may represent atelectasis or infection. 3. Central pulmonary vascular congestion and cardiomegaly. Electronically Signed   By: Darliss Cheney M.D.   On: 12/07/2022 21:57    Microbiology: Results for orders placed or performed during the hospital encounter of 12/07/22  Culture, Urine (Do not remove urinary catheter, catheter placed by urology or difficult to place)     Status: Abnormal   Collection Time: 12/07/22  3:01 PM   Specimen: Urine, Catheterized  Result Value Ref Range Status   Specimen Description   Final    URINE, CATHETERIZED Performed at [facility], [address] [place], [place] [address]    Special Requests   Final    NONE Performed at [facility], [address] [place], [place] [address]    Culture MULTIPLE SPECIES PRESENT, SUGGEST RECOLLECTION (A)  Final   Report Status 12/08/2022 FINAL  Final  Blood Culture ID Panel (Reflexed)     Status: Abnormal   Collection Time: 12/07/22  4:00 PM  Result Value Ref Range Status   Enterococcus  faecalis NOT DETECTED NOT DETECTED Final   Enterococcus Faecium NOT DETECTED NOT DETECTED Final   Listeria monocytogenes NOT DETECTED NOT DETECTED Final   Staphylococcus species DETECTED (A) NOT DETECTED Final    Comment: CRITICAL RESULT CALLED TO, READ BACK BY AND VERIFIED WITH: PHARMD C.DAVIS [id number] @1649  DRT    Staphylococcus aureus (BCID) NOT DETECTED NOT DETECTED Final   Staphylococcus epidermidis NOT DETECTED NOT DETECTED Final   Staphylococcus lugdunensis NOT DETECTED NOT DETECTED Final   Streptococcus species NOT DETECTED NOT DETECTED Final   Streptococcus agalactiae NOT DETECTED NOT DETECTED Final   Streptococcus pneumoniae NOT DETECTED NOT DETECTED Final   Streptococcus pyogenes NOT DETECTED NOT DETECTED Final   A.calcoaceticus-baumannii NOT DETECTED NOT DETECTED Final   Bacteroides fragilis NOT DETECTED NOT DETECTED Final   Enterobacterales NOT DETECTED NOT DETECTED Final   Enterobacter cloacae complex NOT DETECTED NOT DETECTED Final   Escherichia coli NOT DETECTED NOT DETECTED Final   Klebsiella aerogenes NOT DETECTED NOT DETECTED Final   Klebsiella oxytoca NOT DETECTED NOT DETECTED Final   Klebsiella pneumoniae NOT DETECTED NOT DETECTED Final   Proteus species NOT DETECTED NOT DETECTED Final   Salmonella species NOT DETECTED NOT DETECTED Final   Serratia marcescens NOT DETECTED NOT DETECTED Final   Haemophilus influenzae NOT DETECTED NOT DETECTED Final   Neisseria meningitidis NOT DETECTED NOT DETECTED Final   Pseudomonas aeruginosa NOT DETECTED NOT DETECTED Final   Stenotrophomonas maltophilia NOT  DETECTED NOT DETECTED Final   Candida albicans NOT DETECTED NOT DETECTED Final   Candida auris NOT DETECTED NOT DETECTED Final   Candida glabrata NOT DETECTED NOT DETECTED Final   Candida krusei NOT DETECTED NOT DETECTED Final   Candida parapsilosis NOT DETECTED NOT DETECTED Final   Candida tropicalis NOT DETECTED NOT DETECTED Final   Cryptococcus neoformans/gattii NOT  DETECTED NOT DETECTED Final    Comment: Performed at [facility], [address] [place], [place] [address]  Blood Culture (routine x 2)     Status: Abnormal   Collection Time: 12/07/22  4:01 PM   Specimen: BLOOD  Result Value Ref Range Status   Specimen Description   Final    BLOOD BLOOD RIGHT ARM Performed at [facility], [address] [place], [place] [address]    Special Requests   Final    BOTTLES DRAWN AEROBIC AND ANAEROBIC Blood Culture results may not be optimal due to an inadequate volume of blood received in culture bottles Performed at [facility], [address] [place], [place] [address]    Culture  Setup Time   Final    GRAM NEGATIVE RODS ANAEROBIC BOTTLE ONLY CRITICAL RESULT CALLED TO, READ BACK BY AND VERIFIED WITH: PHARMD J.GADHIA AT 0947 ON 12/08/2022 BY T.SAAD. GRAM POSITIVE COCCI BOTTLES DRAWN AEROBIC ONLY CRITICAL RESULT CALLED TO, READ BACK BY AND VERIFIED WITH: PHARMD C.DAVIS [id number] @ 1649 FH    Culture (A)  Final    ESCHERICHIA COLI STAPHYLOCOCCUS HOMINIS THE SIGNIFICANCE OF ISOLATING THIS ORGANISM FROM A SINGLE SET OF BLOOD CULTURES WHEN MULTIPLE SETS ARE DRAWN IS UNCERTAIN. PLEASE NOTIFY THE MICROBIOLOGY DEPARTMENT WITHIN ONE WEEK IF SPECIATION AND SENSITIVITIES ARE REQUIRED. Performed at [facility], [address] [place], [place] [address]    Report Status 12/10/2022 FINAL  Final   Organism ID, Bacteria ESCHERICHIA COLI  Final   Organism ID, Bacteria ESCHERICHIA COLI  Final      Susceptibility   Escherichia coli - KIRBY BAUER*    CEFAZOLIN SENSITIVE Sensitive    Escherichia coli - MIC*    AMPICILLIN <=2 SENSITIVE Sensitive     CEFEPIME <=0.12 SENSITIVE Sensitive     CEFTAZIDIME <=1 SENSITIVE Sensitive     CEFTRIAXONE <=0.25 SENSITIVE Sensitive     CIPROFLOXACIN <=0.25 SENSITIVE Sensitive     GENTAMICIN <=1 SENSITIVE Sensitive     IMIPENEM <=0.25 SENSITIVE Sensitive     TRIMETH/SULFA <=20  SENSITIVE Sensitive     AMPICILLIN/SULBACTAM <=2 SENSITIVE Sensitive     PIP/TAZO <=4 SENSITIVE Sensitive ug/mL    * ESCHERICHIA COLI    ESCHERICHIA COLI  Blood Culture ID Panel (Reflexed)     Status: Abnormal   Collection Time: 12/07/22  4:01 PM  Result Value Ref Range Status   Enterococcus faecalis NOT DETECTED NOT DETECTED Final   Enterococcus Faecium NOT DETECTED NOT DETECTED Final   Listeria monocytogenes NOT DETECTED NOT DETECTED Final   Staphylococcus species NOT DETECTED NOT DETECTED Final   Staphylococcus aureus (BCID) NOT DETECTED NOT DETECTED Final   Staphylococcus epidermidis NOT DETECTED NOT DETECTED Final   Staphylococcus lugdunensis NOT DETECTED NOT DETECTED Final   Streptococcus species NOT DETECTED NOT DETECTED Final   Streptococcus agalactiae NOT DETECTED NOT DETECTED Final   Streptococcus pneumoniae NOT DETECTED NOT DETECTED Final   Streptococcus pyogenes NOT DETECTED NOT DETECTED Final   A.calcoaceticus-baumannii NOT DETECTED NOT DETECTED Final   Bacteroides fragilis NOT DETECTED NOT DETECTED Final  Enterobacterales DETECTED (A) NOT DETECTED Final    Comment: Enterobacterales represent a large order of gram negative bacteria, not a single organism. CRITICAL RESULT CALLED TO, READ BACK BY AND VERIFIED WITH: PHARMD J.GADHIA AT 0947 ON 12/08/2022 BY T.SAAD.    Enterobacter cloacae complex NOT DETECTED NOT DETECTED Final   Escherichia coli DETECTED (A) NOT DETECTED Final    Comment: CRITICAL RESULT CALLED TO, READ BACK BY AND VERIFIED WITH: PHARMD J.GADHIA AT 0947 ON 12/08/2022 BY T.SAAD.    Klebsiella aerogenes NOT DETECTED NOT DETECTED Final   Klebsiella oxytoca NOT DETECTED NOT DETECTED Final   Klebsiella pneumoniae NOT DETECTED NOT DETECTED Final   Proteus species NOT DETECTED NOT DETECTED Final   Salmonella species NOT DETECTED NOT DETECTED Final   Serratia marcescens NOT DETECTED NOT DETECTED Final   Haemophilus influenzae NOT DETECTED NOT DETECTED Final    Neisseria meningitidis NOT DETECTED NOT DETECTED Final   Pseudomonas aeruginosa NOT DETECTED NOT DETECTED Final   Stenotrophomonas maltophilia NOT DETECTED NOT DETECTED Final   Candida albicans NOT DETECTED NOT DETECTED Final   Candida auris NOT DETECTED NOT DETECTED Final   Candida glabrata NOT DETECTED NOT DETECTED Final   Candida krusei NOT DETECTED NOT DETECTED Final   Candida parapsilosis NOT DETECTED NOT DETECTED Final   Candida tropicalis NOT DETECTED NOT DETECTED Final   Cryptococcus neoformans/gattii NOT DETECTED NOT DETECTED Final   CTX-M ESBL NOT DETECTED NOT DETECTED Final   Carbapenem resistance IMP NOT DETECTED NOT DETECTED Final   Carbapenem resistance KPC NOT DETECTED NOT DETECTED Final   Carbapenem resistance NDM NOT DETECTED NOT DETECTED Final   Carbapenem resist OXA 48 LIKE NOT DETECTED NOT DETECTED Final   Carbapenem resistance VIM NOT DETECTED NOT DETECTED Final    Comment: Performed at [facility], [address] [place], [place] [address]  Resp panel by RT-PCR (RSV, Flu A&B, Covid) Anterior Nasal Swab     Status: None   Collection Time: 12/07/22  4:40 PM   Specimen: Anterior Nasal Swab  Result Value Ref Range Status   SARS Coronavirus 2 by RT PCR NEGATIVE NEGATIVE Final    Comment: (NOTE) SARS-CoV-2 target nucleic acids are NOT DETECTED.  The SARS-CoV-2 RNA is generally detectable in upper respiratory specimens during the acute phase of infection. The lowest concentration of SARS-CoV-2 viral copies this assay can detect is 138 copies/mL. A negative result does not preclude SARS-Cov-2 infection and should not be used as the sole basis for treatment or other patient management decisions. A negative result may occur with  improper specimen collection/handling, submission of specimen other than nasopharyngeal swab, presence of viral mutation(s) within the areas targeted by this assay, and inadequate number of viral copies(<138 copies/mL). A negative result  must be combined with clinical observations, patient history, and epidemiological information. The expected result is Negative.  Fact Sheet for Patients:  BloggerCourse.com  Fact Sheet for Healthcare Providers:  SeriousBroker.it  This test is no t yet approved or cleared by the [place] FDA and  has been authorized for detection and/or diagnosis of SARS-CoV-2 by FDA under an Emergency Use Authorization (EUA). This EUA will remain  in effect (meaning this test can be used) for the duration of the COVID-19 declaration under Section 564(b)(1) of the Act, 21 U.S.C.section 360bbb-3(b)(1), unless the authorization is terminated  or revoked sooner.       Influenza A by PCR NEGATIVE NEGATIVE Final   Influenza B by PCR NEGATIVE NEGATIVE Final    Comment: (  NOTE) The Xpert Xpress SARS-CoV-2/FLU/RSV plus assay is intended as an aid in the diagnosis of influenza from Nasopharyngeal swab specimens and should not be used as a sole basis for treatment. Nasal washings and aspirates are unacceptable for Xpert Xpress SARS-CoV-2/FLU/RSV testing.  Fact Sheet for Patients: BloggerCourse.com  Fact Sheet for Healthcare Providers: SeriousBroker.it  This test is not yet approved or cleared by the [place] FDA and has been authorized for detection and/or diagnosis of SARS-CoV-2 by FDA under an Emergency Use Authorization (EUA). This EUA will remain in effect (meaning this test can be used) for the duration of the COVID-19 declaration under Section 564(b)(1) of the Act, 21 U.S.C. section 360bbb-3(b)(1), unless the authorization is terminated or revoked.     Resp Syncytial Virus by PCR NEGATIVE NEGATIVE Final    Comment: (NOTE) Fact Sheet for Patients: BloggerCourse.com  Fact Sheet for Healthcare Providers: SeriousBroker.it  This test is  not yet approved or cleared by the [place] FDA and has been authorized for detection and/or diagnosis of SARS-CoV-2 by FDA under an Emergency Use Authorization (EUA). This EUA will remain in effect (meaning this test can be used) for the duration of the COVID-19 declaration under Section 564(b)(1) of the Act, 21 U.S.C. section 360bbb-3(b)(1), unless the authorization is terminated or revoked.  Performed at [facility], [address], [place], [place] [address]   Blood Culture (routine x 2)     Status: None (Preliminary result)   Collection Time: 12/07/22 10:10 PM   Specimen: BLOOD  Result Value Ref Range Status   Specimen Description   Final    BLOOD BLOOD RIGHT ARM Performed at [facility], [address], [place], [place] [address]    Special Requests   Final    BOTTLES DRAWN AEROBIC AND ANAEROBIC Blood Culture adequate volume Performed at [facility], [address], [place], [place] [address]    Culture   Final    NO GROWTH 4 DAYS Performed at [facility], [address], [place], [place] [address]    Report Status PENDING  Incomplete  MRSA Next Gen by PCR, Nasal     Status: None   Collection Time: 12/08/22  1:46 AM   Specimen: Nasal Mucosa; Nasal Swab  Result Value Ref Range Status   MRSA by PCR Next Gen NOT DETECTED NOT DETECTED Final    Comment: (NOTE) The GeneXpert MRSA Assay (FDA approved for NASAL specimens only), is one component of a comprehensive MRSA colonization surveillance program. It is not intended to diagnose MRSA infection nor to guide or monitor treatment for MRSA infections. Test performance is not FDA approved in patients less than 72 years old. Performed at [facility], [address], [place], [place] [address]   Culture, blood (Routine X 2) w Reflex to ID Panel     Status: None (Preliminary result)   Collection Time: 12/09/22  2:19 PM   Specimen: BLOOD  LEFT HAND  Result Value Ref Range Status   Specimen Description   Final    BLOOD LEFT HAND Performed at [facility], [address], [place], [place] [address]    Special Requests   Final    BOTTLES DRAWN AEROBIC ONLY Blood Culture results may not be optimal due to an inadequate volume of blood received in culture bottles Performed at [facility], [address], [place], [place] [address]    Culture   Final  NO GROWTH 3 DAYS Performed at [facility], [address], [place], [place] [address]    Report Status PENDING  Incomplete  Culture, blood (Routine X 2) w Reflex to ID Panel     Status: Abnormal (Preliminary result)   Collection Time: 12/09/22  2:27 PM   Specimen: BLOOD LEFT HAND  Result Value Ref Range Status   Specimen Description   Final    BLOOD LEFT HAND Performed at [facility], [address] [place], [place] [address]    Special Requests   Final    BOTTLES DRAWN AEROBIC AND ANAEROBIC Blood Culture results may not be optimal due to an inadequate volume of blood received in culture bottles Performed at [facility], [address], [place], [place] [address]    Culture  Setup Time   Final    GRAM POSITIVE COCCI IN CLUSTERS BOTTLES DRAWN AEROBIC ONLY CRITICAL VALUE NOTED.  VALUE IS CONSISTENT WITH PREVIOUSLY REPORTED AND CALLED VALUE.    Culture (A)  Final    STAPHYLOCOCCUS HOMINIS SUSCEPTIBILITIES TO FOLLOW Performed at [facility], [address], [place], [place] [address]    Report Status PENDING  Incomplete    Labs: CBC: Recent Labs  Lab 12/07/22 1501 12/07/22 2133 12/08/22 0302 12/10/22 0703  WBC 18.2* 17.6* 14.1* 11.5*  NEUTROABS 14.8* 15.6*  --   --   HGB 10.5* 9.4* 9.5* 9.6*  HCT 33.5* 29.1* 29.3* 29.4*  MCV 90.8 88.4 88.3 87.5  PLT 226 210 205 249   Basic Metabolic Panel: Recent Labs  Lab 12/07/22 2133 12/08/22 0302 12/08/22 0855 12/08/22 1513 12/08/22 2118  12/09/22 0258 12/10/22 0703  NA 119* 124* 128* 131* 129* 128* 130*  K 3.8 4.0 4.2 4.0 4.0 3.9 3.6  CL 92* 97* 101 103 104 102 97*  CO2 19* 20* 17* 20* 17* 20* 22  GLUCOSE 101* 137* 105* 132* 127* 103* 107*  BUN 12 11 9 9 8 8  5*  CREATININE 0.45 0.57 0.46 0.43* 0.46 0.37* <0.30*  CALCIUM 7.4* 7.6* 7.5* 7.8* 7.3* 7.2* 7.9*  MG 1.3* 2.2  --   --   --   --   --   PHOS 1.9* 3.2  --   --   --   --   --    Liver Function Tests: Recent Labs  Lab 12/07/22 2133 12/08/22 0302  AST 13* 13*  ALT 12 10  ALKPHOS 57 52  BILITOT 0.9 0.3  PROT 5.0* 4.8*  ALBUMIN 2.4* 2.6*   CBG: No results for input(s): "GLUCAP" in the last 168 hours.  Discharge time spent: 35 minutes  Signed: Kathlen Mody, MD [facility]

## 2022-12-13 LAB — CULTURE, BLOOD (ROUTINE X 2)
Culture: NO GROWTH
Special Requests: ADEQUATE

## 2022-12-14 LAB — CULTURE, BLOOD (ROUTINE X 2): Culture: NO GROWTH

## 2023-05-09 ENCOUNTER — Encounter: Payer: Self-pay | Admitting: Neurology

## 2023-05-17 ENCOUNTER — Telehealth: Payer: Self-pay | Admitting: Neurology

## 2023-05-17 NOTE — Telephone Encounter (Signed)
 Pt's husband called to make sure Kelly Cole's next appointment is not an in office visit. He said she can't come in so it needs to be a phone call, not a virtual.

## 2023-05-17 NOTE — Telephone Encounter (Signed)
 Patient husband advised,  Unfortunately, I found out per [facility] policy, she will need to be seen in the office.    Patient has a at home provider that see her every month called [facility].

## 2023-09-27 NOTE — Progress Notes (Unsigned)
 Virtual Visit via Video Note  Consent was obtained for video visit:  Yes.   Answered questions that patient had about telehealth interaction:  Yes.   I discussed the limitations, risks, security and privacy concerns of performing an evaluation and management service by telemedicine. I also discussed with the patient that there may be a patient responsible charge related to this service. The patient expressed understanding and agreed to proceed.  Pt location: Home Physician Location: office Name of referring provider:  No ref. provider found I connected with NAMI STRAWDER at patients initiation/request on 09/28/2023 at  3:30 PM EDT by video enabled telemedicine application and verified that I am speaking with the correct person using two identifiers. 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 Asked if her primary care service can check CBC and CMP and have results faxed to me Otherwise, follow up in one year.  Need for in person visit now:  No.  Total time spent on today's visit was 33 minutes dedicated to this patient today, preparing to see patient, counseling the patient, documenting clinical information in the EHR or other health record, independently interpreting results and communicating results to the patient/family, discussing treatment and goals, answering patient's questions and coordinating care.    Subjective:  Kelly Cole is a 83 year old white female with multiple sclerosis, depression and chronic low back pain who follows up for new-onset seizure.  History supplemented by her husband.    UPDATE: Current medications:  lamotrigine  50mg  twice daily, Lorazepam  0.5mg -1mg  twice daily PRN, Buspirone  15mg  three times daily, temazepam  30mg  at bedtime, venlafaxine  100mg  three times daily, lisinopril  40mg  daily, clonidine 0.1mg  patch weekly, amlodipine   Living at home with her husband.  Nonambulatory and confined to her bed.  Her main concern is pressure sores.  She uses [facility] as her primary care provider as they have NPs that come to the home. She has two caregivers through [employer].  Between the two, she has coverage from 12PM-8PM daily.  Overall, mood is good given the circumstances.  She has not had any recent episodes of confusion or delirium.  She was last in the hospital at the end of October for sepsis due to UTI and aspiration pneumonia.  She has had maybe one uncomplicated UTI since then.  Sleep is erratic.  Takes temazepam  which helps but sometimes she will wake up in the early morning and have trouble falling back asleep.     HISTORY: As a teenager, she was hospitalized for bilateral leg weakness.  They thought Polio but never had a diagnosis.  Symptoms resolved.  She was diagnosed with MS in 1970 while in [place] after experiencing numbness and tingling in her fingers.  She may have received visual evoked potentials but was primarily diagnosed based on clinical findings.  Over the next decade, she gradually had decline in gait, developing a foot drop which caused her to trip.  In the mid-late 70s, she participated in a trial where she was treated with chlorambucil and drop foot resolved.  High doses of vitamin injections (C, B1, B2, B12, etc)   Over the last 10-15 years, she has had  a gradual decline in ambulation.  She progressed from cane to walker about 10 years ago.  She has been in a wheelchair for the past 6 years, following a hemicolectomy.  MRI of brain from 2019 showed chronic demyelination.   She was admitted to [facility] on 09/20/2020 for seizure-like  activity.  She was found by her husband unresponsive with roving eye movements rolled back and restless non-purposeful movements of all extremities.  Lasted 45-60 seconds.  Afterwards, she was lethargic and then exhibited teeth grinding with flailing of her arms.  EMS was called and she was brought to the ED.  Neurologic exam demonstrated bilateral lower extremity contractures without antigravity movement and brief coarsse tremor of the left upper extremity.  DTRs were 3+ throughout.  CT head personally reviewed showed left basal ganglia lacunar infarct but no acute intracranial abnormality.  EEG showed diffuse encephalopathy but no epileptiform discharges or electrographic seizures.  She was started on antibiotics for suspected aspiration pneumonia.  Urine culture was negative.  K and Mg were 3.3 and 1.4 respectively.  Na fell between 127-131.  She takes temazepam  at home, which was switched to Ativan  during hospitalization in order to prevent withdrawal.  He was discharged on Depakote  sprinkles 500mg  every 12 hours.  Wellbutrin  was discontinued.    Past disease modifying therapy:  Betaseron (for 1 year, stopped due to side effects and ineffective) Other past medications:  Depakote  (for seizure - lethargy and diarrhea), levetiracetam  (prescribed as a bridge during transition from Depakote  to lamotrigine ), quetiapine   Past Medical History: Past Medical History:  Diagnosis Date   HTN (hypertension)    Multiple sclerosis (HCC)     Medications: Outpatient Encounter Medications as of 09/28/2023  Medication Sig   acetaminophen  (TYLENOL ) 325 MG tablet Take 2 tablets (650 mg total) by mouth every 6 (six) hours as needed for mild pain (or Fever >/= 101). (Patient taking differently: Take 500 mg by mouth every 6 (six) hours as needed for mild pain (pain score 1-3) (or Fever >/= 101).)   amLODipine  (NORVASC ) 5 MG tablet Take 1 tablet (5 mg total) by mouth at bedtime.   busPIRone  (BUSPAR ) 15 MG tablet TAKE 1  TABLET(15 MG) BY MOUTH THREE TIMES DAILY   cholestyramine  light (PREVALITE ) 4 g packet Take 4 g by mouth 4 (four) times daily.   fluticasone  (FLONASE ) 50 MCG/ACT nasal spray Place 1 spray into both nostrils daily.   folic acid  (FOLVITE ) 1 MG tablet Take 1 tablet (1 mg total) by mouth daily.   food thickener (SIMPLYTHICK, NECTAR/LEVEL 2/MILDLY THICK,) GEL Take 1 packet by mouth as needed (for any liquids she drinks). Gluten-free   lamoTRIgine  (LAMICTAL ) 25 MG tablet TAKE 2 TABLETS BY MOUTH TWICE DAILY   lisinopril  (ZESTRIL ) 40 MG tablet Take 40 mg by mouth daily.   loperamide  (IMODIUM ) 2 MG capsule Take 1 capsule (2 mg total) by mouth 3 (three) times daily as needed for diarrhea or loose stools.   LORazepam  (ATIVAN ) 0.5 MG tablet Take 1 to 2 tablets twice daily as needed for agitation   Multiple Vitamin (MULTIVITAMIN WITH MINERALS) TABS tablet Take 1 tablet by mouth daily.   polyethylene glycol powder (GLYCOLAX /MIRALAX ) 17 GM/SCOOP powder Dissolve 1 capful (17 g) in water and drink daily as needed for mild constipation.   propranolol  (INDERAL ) 40 MG tablet Take 1 tablet (40 mg total) by mouth 2 (two) times daily.   QUEtiapine  (SEROQUEL ) 25 MG tablet Take 50 mg by mouth at bedtime.   temazepam  (  RESTORIL ) 30 MG capsule Take 30 mg by mouth at bedtime as needed for sleep.   venlafaxine  (EFFEXOR ) 100 MG tablet Take 1 tablet (100 mg total) by mouth 3 (three) times daily with meals.   No facility-administered encounter medications on file as of 09/28/2023.    Allergies: Allergies  Allergen Reactions   Demerol  [Meperidine] Other (See Comments)   No [facility] Allergies     Dairy, gluten, eggs    Family History: No family history on file.  Observations/Objective:   No acute distress.  Alert and oriented.  Speech fluent and not dysarthric.  Language intact.  Eyes orthophoric on primary gaze.  Face symmetric.   Follow Up Instructions:    -I discussed the assessment and treatment plan  with the patient. The patient was provided an opportunity to ask questions and all were answered. The patient agreed with the plan and demonstrated an understanding of the instructions.   The patient was advised to call back or seek an in-person evaluation if the symptoms worsen or if the condition fails to improve as anticipated.   Juliene Lamar Dunnings, DO

## 2023-09-28 ENCOUNTER — Encounter: Payer: Self-pay | Admitting: Neurology

## 2023-09-28 ENCOUNTER — Telehealth (INDEPENDENT_AMBULATORY_CARE_PROVIDER_SITE_OTHER): Admitting: Neurology

## 2023-09-28 DIAGNOSIS — G252 Other specified forms of tremor: Secondary | ICD-10-CM

## 2023-09-28 DIAGNOSIS — F0280 Dementia in other diseases classified elsewhere without behavioral disturbance: Secondary | ICD-10-CM

## 2023-09-28 DIAGNOSIS — Z87898 Personal history of other specified conditions: Secondary | ICD-10-CM

## 2023-09-28 DIAGNOSIS — G35 Multiple sclerosis: Secondary | ICD-10-CM

## 2023-10-05 ENCOUNTER — Ambulatory Visit: Payer: PPO | Admitting: Neurology

## 2023-10-29 ENCOUNTER — Other Ambulatory Visit: Payer: Self-pay | Admitting: Neurology

## 2024-10-01 ENCOUNTER — Telehealth: Admitting: Neurology
